# Patient Record
Sex: Female | Born: 1980
Health system: Southern US, Community
[De-identification: ages and names within clinical notes are randomized; demographics above are authoritative.]

## PROBLEM LIST (undated history)

## (undated) DIAGNOSIS — Z8639 Personal history of other endocrine, nutritional and metabolic disease: Secondary | ICD-10-CM

## (undated) DIAGNOSIS — E039 Hypothyroidism, unspecified: Secondary | ICD-10-CM

## (undated) DIAGNOSIS — K5900 Constipation, unspecified: Secondary | ICD-10-CM

## (undated) DIAGNOSIS — Z803 Family history of malignant neoplasm of breast: Secondary | ICD-10-CM

## (undated) DIAGNOSIS — K635 Polyp of colon: Secondary | ICD-10-CM

## (undated) DIAGNOSIS — E785 Hyperlipidemia, unspecified: Secondary | ICD-10-CM

## (undated) DIAGNOSIS — O1490 Unspecified pre-eclampsia, unspecified trimester: Secondary | ICD-10-CM

## (undated) DIAGNOSIS — G43909 Migraine, unspecified, not intractable, without status migrainosus: Secondary | ICD-10-CM

## (undated) DIAGNOSIS — E079 Disorder of thyroid, unspecified: Secondary | ICD-10-CM

## (undated) DIAGNOSIS — K648 Other hemorrhoids: Secondary | ICD-10-CM

## (undated) DIAGNOSIS — T7840XA Allergy, unspecified, initial encounter: Secondary | ICD-10-CM

## (undated) DIAGNOSIS — R7989 Other specified abnormal findings of blood chemistry: Secondary | ICD-10-CM

## (undated) DIAGNOSIS — Z1371 Encounter for nonprocreative screening for genetic disease carrier status: Secondary | ICD-10-CM

## (undated) DIAGNOSIS — E559 Vitamin D deficiency, unspecified: Secondary | ICD-10-CM

## (undated) HISTORY — DX: Allergy, unspecified, initial encounter: T78.40XA

## (undated) HISTORY — DX: Unspecified pre-eclampsia, unspecified trimester: O14.90

## (undated) HISTORY — DX: Personal history of other endocrine, nutritional and metabolic disease: Z86.39

## (undated) HISTORY — DX: Migraine, unspecified, not intractable, without status migrainosus: G43.909

## (undated) HISTORY — DX: Family history of malignant neoplasm of breast: Z80.3

## (undated) HISTORY — PX: POLYPECTOMY: SHX149

## (undated) HISTORY — DX: Encounter for nonprocreative screening for genetic disease carrier status: Z13.71

## (undated) HISTORY — PX: HEMORRHOID BANDING: SHX5850

## (undated) HISTORY — DX: Other specified abnormal findings of blood chemistry: R79.89

## (undated) HISTORY — DX: Constipation, unspecified: K59.00

## (undated) HISTORY — DX: Vitamin D deficiency, unspecified: E55.9

## (undated) HISTORY — DX: Hyperlipidemia, unspecified: E78.5

## (undated) HISTORY — DX: Other hemorrhoids: K64.8

## (undated) HISTORY — DX: Hypothyroidism, unspecified: E03.9

## (undated) HISTORY — PX: THYROID SURGERY: SHX805

## (undated) HISTORY — DX: Polyp of colon: K63.5

---

## 2008-12-30 HISTORY — PX: THYROIDECTOMY: SHX17

## 2011-08-14 ENCOUNTER — Emergency Department: Payer: Self-pay | Admitting: Emergency Medicine

## 2011-12-31 DIAGNOSIS — G932 Benign intracranial hypertension: Secondary | ICD-10-CM

## 2011-12-31 HISTORY — DX: Benign intracranial hypertension: G93.2

## 2012-01-27 ENCOUNTER — Inpatient Hospital Stay: Payer: Self-pay | Admitting: Obstetrics and Gynecology

## 2012-01-27 LAB — CBC WITH DIFFERENTIAL/PLATELET
Eosinophil #: 0.1 10*3/uL (ref 0.0–0.7)
Lymphocyte #: 2.2 10*3/uL (ref 1.0–3.6)
MCH: 30.9 pg (ref 26.0–34.0)
MCHC: 34.3 g/dL (ref 32.0–36.0)
MCV: 90 fL (ref 80–100)
Monocyte #: 0.4 10*3/uL (ref 0.0–0.7)
Neutrophil #: 5.6 10*3/uL (ref 1.4–6.5)
Neutrophil %: 68.1 %
Platelet: 171 10*3/uL (ref 150–440)
RDW: 15.6 % — ABNORMAL HIGH (ref 11.5–14.5)

## 2012-01-28 LAB — HEMATOCRIT: HCT: 37.7 % (ref 35.0–47.0)

## 2012-01-31 LAB — PATHOLOGY REPORT

## 2012-02-04 ENCOUNTER — Encounter (HOSPITAL_COMMUNITY): Payer: Self-pay | Admitting: *Deleted

## 2012-02-04 ENCOUNTER — Emergency Department (HOSPITAL_COMMUNITY): Payer: PRIVATE HEALTH INSURANCE

## 2012-02-04 ENCOUNTER — Emergency Department (HOSPITAL_COMMUNITY)
Admission: EM | Admit: 2012-02-04 | Discharge: 2012-02-04 | Disposition: A | Discharge status: Home / Self Care | Payer: PRIVATE HEALTH INSURANCE | Attending: Emergency Medicine | Admitting: Emergency Medicine

## 2012-02-04 DIAGNOSIS — R51 Headache: Secondary | ICD-10-CM | POA: Insufficient documentation

## 2012-02-04 DIAGNOSIS — I10 Essential (primary) hypertension: Secondary | ICD-10-CM | POA: Insufficient documentation

## 2012-02-04 HISTORY — DX: Disorder of thyroid, unspecified: E07.9

## 2012-02-04 LAB — BASIC METABOLIC PANEL
Calcium: 9.1 mg/dL (ref 8.4–10.5)
Creatinine, Ser: 0.69 mg/dL (ref 0.50–1.10)
GFR calc Af Amer: >90 mL/min (ref >90)
GFR calc non Af Amer: >90 mL/min (ref >90)
Sodium: 137 mEq/L (ref 135–145)

## 2012-02-04 LAB — DIFFERENTIAL
Basophils Absolute: 0 10*3/uL (ref 0.0–0.1)
Basophils Relative: 0 % (ref 0–1)
Eosinophils Absolute: 0.2 10*3/uL (ref 0.0–0.7)
Eosinophils Relative: 2 % (ref 0–5)
Lymphocytes Relative: 22 % (ref 12–46)
Monocytes Absolute: 0.5 10*3/uL (ref 0.1–1.0)

## 2012-02-04 LAB — CBC
MCHC: 32.9 g/dL (ref 30.0–36.0)
MCV: 89.4 fL (ref 78.0–100.0)
Platelets: 306 10*3/uL (ref 150–400)
RDW: 14.3 % (ref 11.5–15.5)
WBC: 10.1 10*3/uL (ref 4.0–10.5)

## 2012-02-04 MED ORDER — HYDROMORPHONE HCL PF 1 MG/ML IJ SOLN: 1.0000 mg | Freq: Once | INTRAMUSCULAR | Status: DC

## 2012-02-04 MED ORDER — ONDANSETRON HCL 4 MG/2ML IJ SOLN: 4.0000 mg | Freq: Once | INTRAMUSCULAR | Status: DC

## 2012-02-04 NOTE — ED Provider Notes (Signed)
History    This chart was scribed for Jacqueline Lennert, MD, MD by Smitty Pluck. The patient was seen in room APA15 and the patient's care was started at 5:24PM.   CSN: [id number]  Arrival date & time 02/04/12  1657   First MD Initiated Contact with Patient 02/04/12 1720      Chief Complaint  Patient presents with  . Headache    (Consider location/radiation/quality/duration/timing/severity/associated sxs/prior treatment) Patient is a 31 y.o. female presenting with headaches. The history is provided by the patient and the spouse.  Headache  This is a new problem. The current episode started more than 2 days ago. The problem occurs constantly. The problem has been gradually worsening. The headache is associated with defecating. The pain is located in the occipital region. The pain is moderate. The pain does not radiate. Pertinent negatives include no fever, no nausea and no vomiting. She has tried acetaminophen for the symptoms. The treatment provided mild relief.   Jacqueline Wilson is a 31 y.o. female who presents to the Emergency Department complaining of headache onset 3 days ago. Pt reports having caesarian section 1 week ago and having elevated BP she has also constipation and being using laxatives. She has taken 1 ibuprofen and percocet (for abdomen) without relief. She states during bowel movement today her headache became increasingly worse. She reports having a bloody nose 3 days ago. Dr. Chauncey Cruel is ob/gyn.   Past Medical History  Diagnosis Date  . Thyroid disease   . Hypertension     Past Surgical History  Procedure Date  . Thyroid surgery   . Cesarean section     History reviewed. No pertinent family history.  History  Substance Use Topics  . Smoking status: Never Smoker   . Smokeless tobacco: Not on file  . Alcohol Use: No    OB History    Grav Para Term Preterm Abortions TAB SAB Ect Mult Living                  Review of Systems  Constitutional: Negative for  fever.  Gastrointestinal: Negative for nausea and vomiting.  Neurological: Positive for headaches.  All other systems reviewed and are negative.   10 Systems reviewed and are negative for acute change except as noted in the HPI.  Allergies  Hydrocodone; Keflex; Latex; Penicillins; and Rubbing alcohol  Home Medications  No current outpatient prescriptions on file.  BP 161/103  Pulse 88  Temp 98 F (36.7 C)  Resp 22  Ht 5\' 3"  (1.6 m)  Wt 192 lb 3 oz (87.176 kg)  BMI 34.04 kg/m2  SpO2 99%  Physical Exam  Nursing note and vitals reviewed. Constitutional: She is oriented to person, place, and time. She appears well-developed and well-nourished. No distress.  HENT:  Head: Normocephalic and atraumatic.  Eyes: Conjunctivae and EOM are normal. No scleral icterus.  Neck: Neck supple. No thyromegaly present.  Cardiovascular: Normal rate and regular rhythm.  Exam reveals no gallop and no friction rub.   No murmur heard. Pulmonary/Chest: No stridor. She has no wheezes. She has no rales. She exhibits no tenderness.  Abdominal: She exhibits no distension. There is no tenderness. There is no rebound.  Musculoskeletal: Normal range of motion. She exhibits no edema.  Lymphadenopathy:    She has no cervical adenopathy.  Neurological: She is oriented to person, place, and time. Coordination normal.  Skin: No rash noted. No erythema.  Psychiatric: She has a normal mood and affect. Her behavior is  normal.    ED Course  Procedures (including critical care time)  DIAGNOSTIC STUDIES: Oxygen Saturation is 99% on room air, normal by my interpretation.    COORDINATION OF CARE:  EDP ordered medication: Diluadid 1 mg and zofran 4 mg  Labs Reviewed  CBC - Abnormal; Notable for the following:    Hemoglobin 11.9 (*)    All other components within normal limits  BASIC METABOLIC PANEL - Abnormal; Notable for the following:    Glucose, Bld 129 (*)    All other components within normal limits    DIFFERENTIAL   Ct Head Wo Contrast  02/04/2012  *RADIOLOGY REPORT*  Clinical Data: Headache  CT HEAD WITHOUT CONTRAST  Technique:  Contiguous axial images were obtained from the base of the skull through the vertex without contrast.  Comparison: None.  Findings: There is no evidence of acute intracranial hemorrhage, brain edema, mass lesion, acute infarction,   mass effect, or midline shift. Acute infarct may be inapparent on noncontrast CT. No other intra-axial abnormalities are seen, and the ventricles and sulci are within normal limits in size and symmetry.   No abnormal extra-axial fluid collections or masses are identified.  No significant calvarial abnormality.  IMPRESSION: 1. Negative for bleed or other acute intracranial process.  Original Report Authenticated By: Osa Craver, M.D.     No diagnosis found.  The pt refused any medicine for her headache because she was breast feeding.  Her headache was mild.  The pt states she has a hx of migraines.  MDM  Stress headache with htn.  Pt is to follow up in two days   The chart was scribed for me under my direct supervision.  I personally performed the history, physical, and medical decision making and all procedures in the evaluation of this patient.Jacqueline Lennert, MD 02/04/12 [phone]

## 2012-02-04 NOTE — ED Notes (Signed)
Pt c/o headache since Sat. Pt is currently breastfeeding the newborn. Pt denies any vomiting.

## 2012-02-04 NOTE — ED Notes (Signed)
Headache since Saturday, started when strained to have BM.  Has been taking laxative.  Had c section 1 week ago Bp was elevated. And had emergent c section.

## 2013-11-01 IMAGING — CT CT HEAD W/O CM
1 series · 16 of 30 positions shown, 20 images · non-contrast
Comparison: None.

CLINICAL DATA: Headache

CT HEAD WITHOUT CONTRAST
TECHNIQUE: Contiguous axial images were obtained from the base of
the skull through the vertex without contrast.

[Series 2: headseq 4.8 h37s · axial · 0.46mm/px · z∈[+130,+293]mm · 16 of 36 slices shown, 20 images]
[im 2/36  brain]
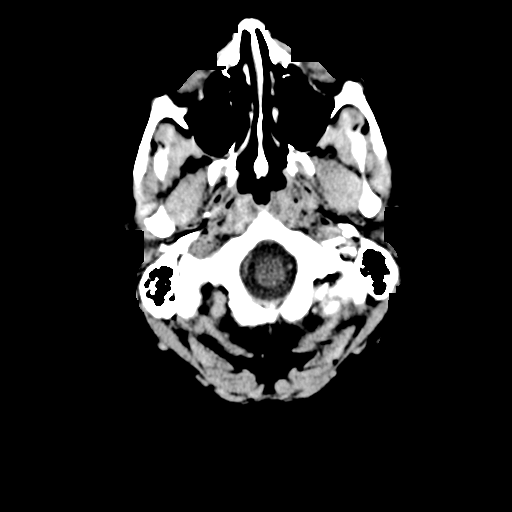
[im 2/36  bone]
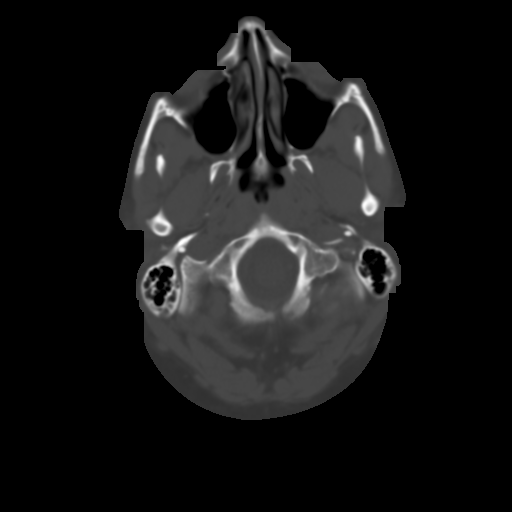
[im 4/36  brain]
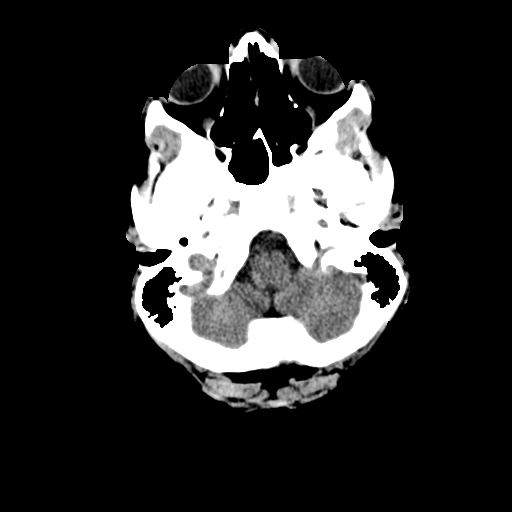
[im 7/36  brain]
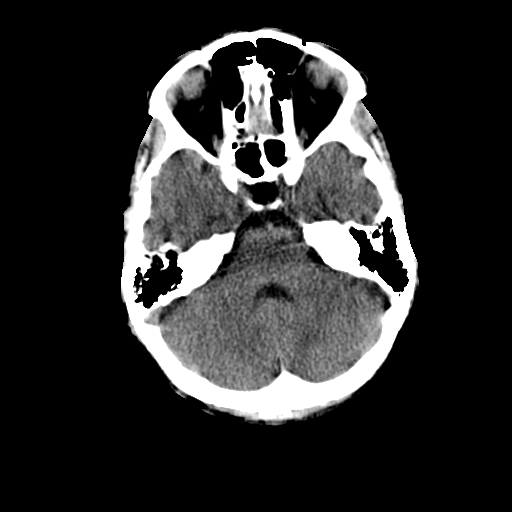
[im 9/36  brain]
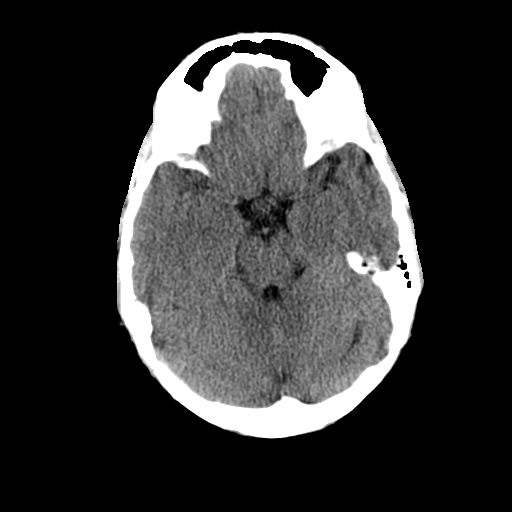
[im 10/36  brain]
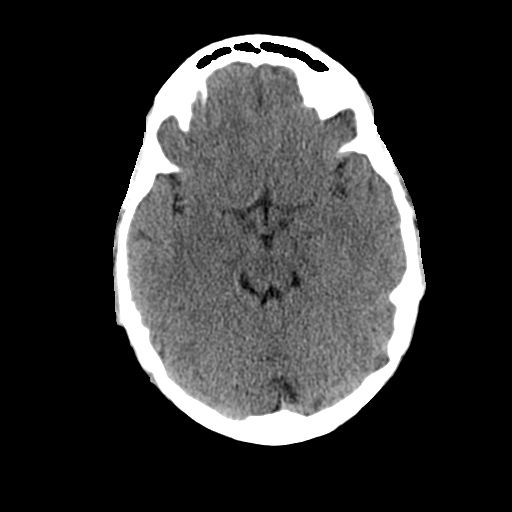
[im 10/36  bone]
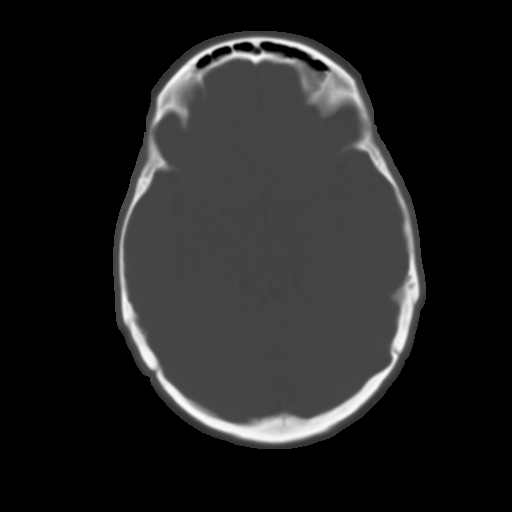
[im 13/36  brain]
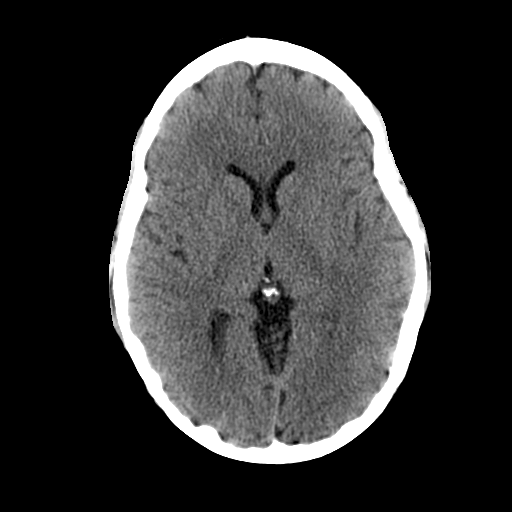
[im 15/36  brain]
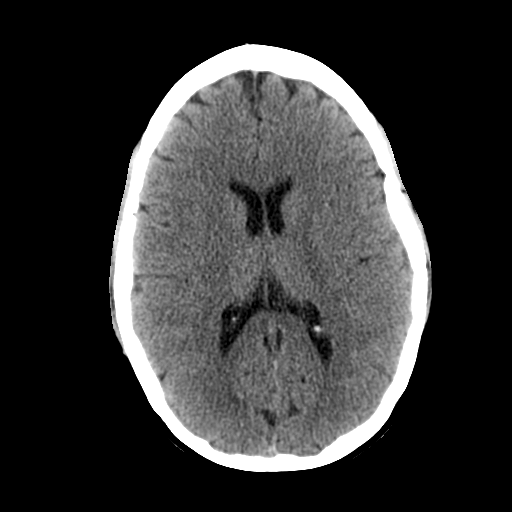
[im 17/36  brain]
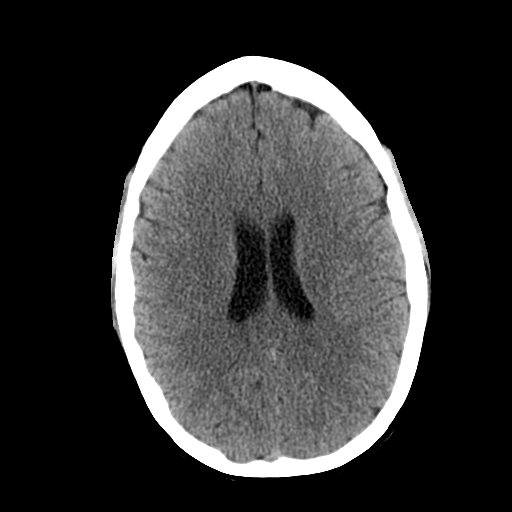
[im 19/36  brain]
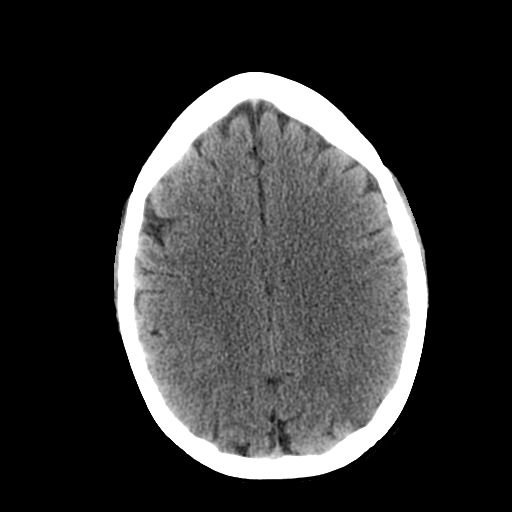
[im 19/36  bone]
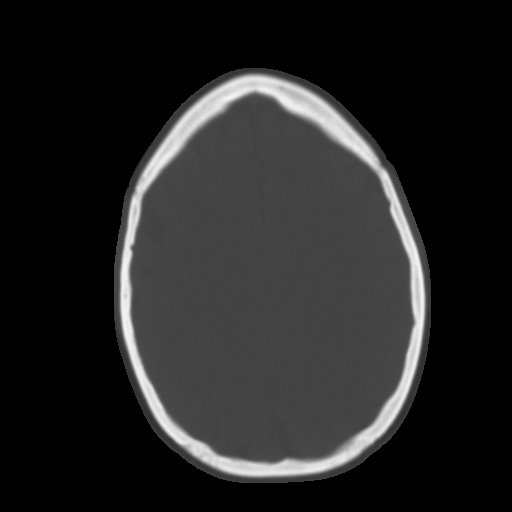
[im 21/36  brain]
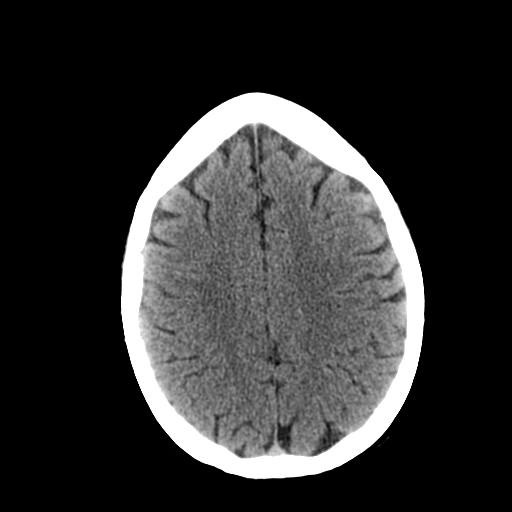
[im 23/36  brain]
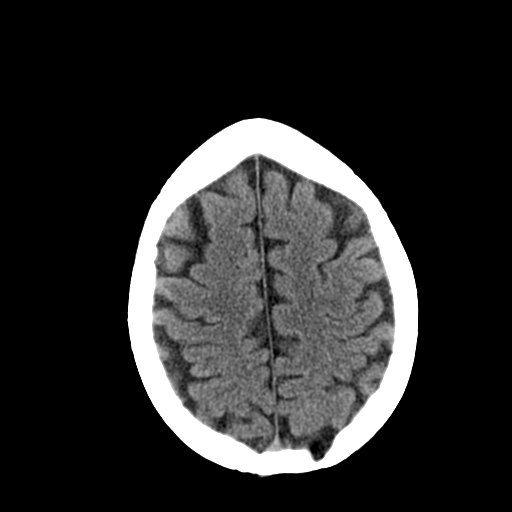
[im 26/36  brain]
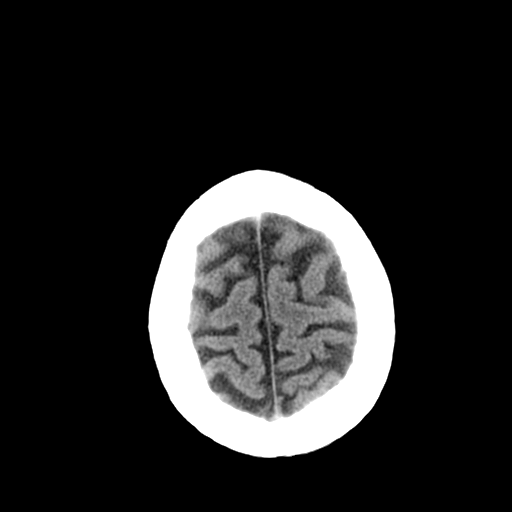
[im 27/36  brain]
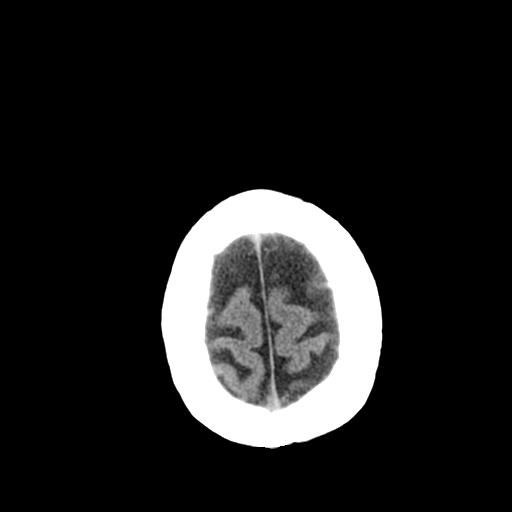
[im 27/36  bone]
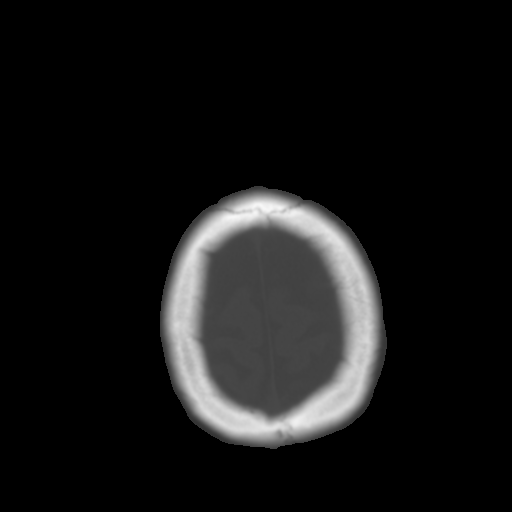
[im 29/36  brain]
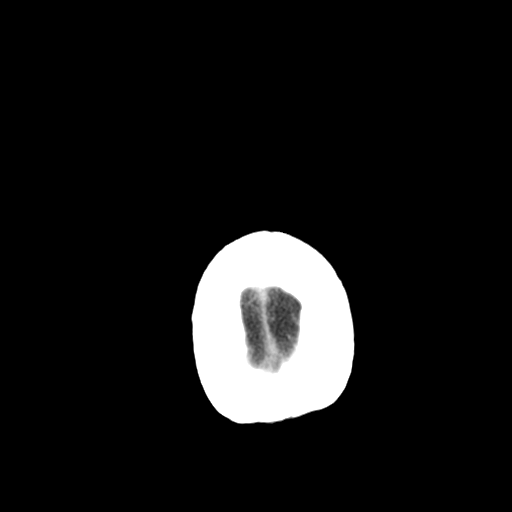
[im 32/36  brain]
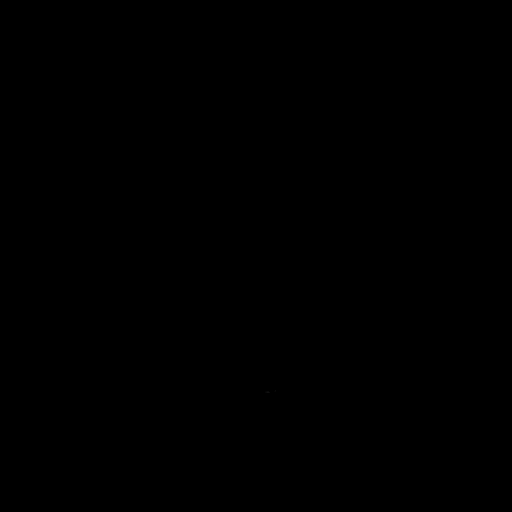
[im 34/36  brain]
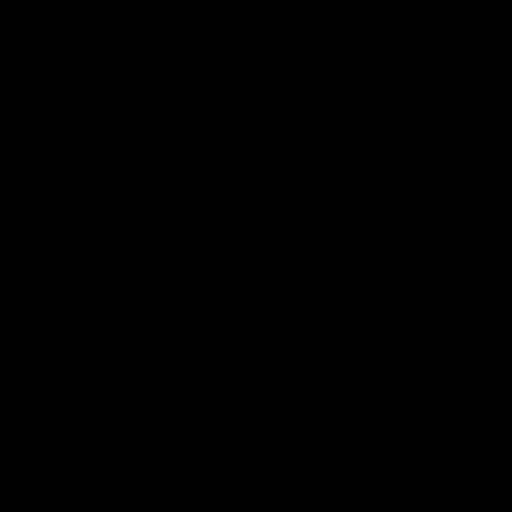

[16 of 30 positions shown; findings below may reference images not displayed]

FINDINGS: There is no evidence of acute intracranial hemorrhage,
brain edema, mass lesion, acute infarction,   mass effect, or
midline shift. Acute infarct may be inapparent on noncontrast CT.
No other intra-axial abnormalities are seen, and the ventricles and
sulci are within normal limits in size and symmetry.   No abnormal
extra-axial fluid collections or masses are identified.  No
significant calvarial abnormality.
IMPRESSION: 1. Negative for bleed or other acute intracranial process.

## 2013-11-09 ENCOUNTER — Ambulatory Visit: Payer: Self-pay

## 2014-09-26 ENCOUNTER — Ambulatory Visit: Payer: Self-pay | Admitting: Internal Medicine

## 2015-04-23 NOTE — Op Note (Signed)
PATIENT NAME:  Jacqueline Wilson, Jacqueline Wilson MR#:  [id number] DATE OF BIRTH:  07-15-1981  DATE OF PROCEDURE:  01/27/2012  PREOPERATIVE DIAGNOSIS: Fetal intolerance to labor with a positive contraction stress test.   POSTOPERATIVE DIAGNOSIS: Fetal intolerance to labor with a positive contraction stress test, vasa previa and small placenta with very small cord.  PROCEDURE PERFORMED: primary c-section   SURGEON: Delsa Sale, M.D.   ASSISTANT: Shann Medal, CNM  ESTIMATED BLOOD LOSS: 1000 mL.  FINDINGS: 38 week fetus weighing 5 pounds, 9 ounces, Apgars 9 and 9, baby girl, multiple fibroids on the uterus.   DESCRIPTION OF PROCEDURE: The patient was taken to the Operating Room and placed in supine position. After adequate anesthesia was instilled, the patient was prepped and draped in the usual sterile fashion. An incision line was drawn on the patient's abdomen and the time out was performed. An incision was then carried sharply down to the fascia. The fascia was nicked in the midline and extended in a superolateral manner with the curved Mayo scissors. The fascia was grasped with Kocher clamps and the superior and inferior rectus fascia was removed from the rectus muscles. The muscle bellies were identified and opened. The peritoneum was grasped and entered sharply. The bladder blade was placed. A bladder flap was created. An uterine incision was made. The infant's head was identified and the infant was delivered. The infant was delivered and the cord was clamped and cut. At this time, the placenta was investigated and it was found that the cord had a vasa previa and that the placental cord was very small as was the placenta. The uterus was exteriorized. Fibroids were identified and the uterus was wrapped in a moist laparotomy sponge. Photographs were taken. The infant was handed off to the awaiting pediatrician, after the cord was clamped and cut. Cord blood was obtained. Pitocin was started. The placenta was  delivered. The interior of the uterus was curetted with a moist laparotomy sponge. The uterine incision was grasped with Pennington clamps and the incision was closed with a running locked chromic suture and a running embrocating suture. A few figure-of-eight sutures were needed on the left lower quadrant of the incision as it was still oozing. The bladder flap was then tacked back up to the bladder incision. The abdomen was cleared of clots. The uterus was placed back into the abdomen. Clots were removed from the belly. The patient's uterus was placed back into the abdomen and the gutters were cleared. Interceed was placed across the uterine incision. Muscle bellies were closed and approximated with a running Vicryl suture. The On-Q pain pump was then placed and the catheters were wrapped around the muscle belly. The fascia was closed with a running Vicryl suture. A subcutaneous plain gut suture was used to approximate Camper's and Scarpa's fascia. Skin clips were placed. A bandage was placed. 4 x 4's were placed under the On-Q pain pump. The incision bandage was placed. The uterus was massaged. A clot was released from the uterus. Clear urine was noted in the Foley bag. The patient was taken to recovery after having tolerated the procedure well.  ____________________________ Delsa Sale, MD cck:slb D: 01/28/2012 21:35:14 ET T: 01/29/2012 08:36:14 ET JOB#: [id number]  cc: Delsa Sale, MD, <Dictator> Delsa Sale MD ELECTRONICALLY SIGNED 01/30/2012 7:41

## 2015-05-09 NOTE — H&P (Signed)
L&D Evaluation:  History:   HPI 34 year old G1P0 at [redacted]w[redacted]d with EDD 02/06/12 presents to L&D from office after 3 min prolong decel during NST.  PNC at [facility] notable for early entry to care, low lying placenta which resolved, choroid plexis noted on 20 week scan but not at 28 weeks, Rh negative, elevated 1 hour GCT, normal 3 hour GTT, and gestational HTN which pt was supposed to be induced at 39 weeks for.   Labs: AB Neg, GBS Neg, RI, VI    Presents with other, prolong decel at office    Patient's Medical History No Chronic Illness    Patient's Surgical History none    Medications Pre Natal Vitamins    Allergies other, latex, morphine, PCN    Social History none    Family History Non-Contributory   ROS:   ROS All systems were reviewed.  HEENT, CNS, GI, GU, Respiratory, CV, Renal and Musculoskeletal systems were found to be normal.   Exam:   Vital Signs stable    Urine Protein not completed    General no apparent distress    Mental Status clear    Abdomen gravid, non-tender    Estimated Fetal Weight Average for gestational age    Back no CVAT    Pelvic no external lesions, closed on previous check    Mebranes Intact    FHT other, normal rate, accels noted, moderate variability, 1-2 min decels noted occasionally    Ucx absent    Skin dry   Impression:   Impression other, IUP 38 4/7, GHTN, non reactive NST   Plan:   Plan EFM/NST, monitor BP, other, CST, possibly cervidil tonight   Electronic Signatures: Shann Medal (CNM)  (Signed 28-Jan-13 23:29)  Authored: L&D Evaluation   Last Updated: 28-Jan-13 23:29 by Shann Medal (CNM)

## 2015-12-11 ENCOUNTER — Encounter: Payer: Self-pay | Admitting: Physician Assistant

## 2015-12-11 ENCOUNTER — Ambulatory Visit (INDEPENDENT_AMBULATORY_CARE_PROVIDER_SITE_OTHER): Payer: PRIVATE HEALTH INSURANCE | Admitting: Physician Assistant

## 2015-12-11 VITALS — BP 112/78 | HR 84 | Temp 98.4°F | Ht 63.75 in | Wt 176.6 lb

## 2015-12-11 DIAGNOSIS — K649 Unspecified hemorrhoids: Secondary | ICD-10-CM

## 2015-12-11 DIAGNOSIS — R42 Dizziness and giddiness: Secondary | ICD-10-CM | POA: Diagnosis not present

## 2015-12-11 DIAGNOSIS — J019 Acute sinusitis, unspecified: Secondary | ICD-10-CM

## 2015-12-11 DIAGNOSIS — B9689 Other specified bacterial agents as the cause of diseases classified elsewhere: Secondary | ICD-10-CM

## 2015-12-11 MED ORDER — DOXYCYCLINE HYCLATE 100 MG PO CAPS: 100.0000 mg | ORAL_CAPSULE | Freq: Two times a day (BID) | ORAL | Status: DC

## 2015-12-11 MED ORDER — MECLIZINE HCL 25 MG PO TABS: 25.0000 mg | ORAL_TABLET | Freq: Three times a day (TID) | ORAL | Status: DC | PRN

## 2015-12-11 MED ORDER — FLUTICASONE PROPIONATE 50 MCG/ACT NA SUSP: 2.0000 | Freq: Every day | NASAL | Status: DC

## 2015-12-11 MED ORDER — HYDROCORTISONE ACETATE 25 MG RE SUPP: 25.0000 mg | Freq: Two times a day (BID) | RECTAL | Status: DC

## 2015-12-11 NOTE — Progress Notes (Signed)
Pre visit review using our clinic review tool, if applicable. No additional management support is needed unless otherwise documented below in the visit note. 

## 2015-12-11 NOTE — Patient Instructions (Signed)
Please take antibiotic as directed.  Increase fluid intake.  Use Saline nasal spray.  Take a daily multivitamin. Take Flonase and Meclizine as directed.  Place a humidifier in the bedroom.  Please call or return clinic if symptoms are not improving.  For hemorrhoids -- continue diet and stool softener. Use the suppositories as directed. You will be contacted by Gastroenterology for further assessment.  Sinusitis Sinusitis is redness, soreness, and swelling (inflammation) of the paranasal sinuses. Paranasal sinuses are air pockets within the bones of your face (beneath the eyes, the middle of the forehead, or above the eyes). In healthy paranasal sinuses, mucus is able to drain out, and air is able to circulate through them by way of your nose. However, when your paranasal sinuses are inflamed, mucus and air can become trapped. This can allow bacteria and other germs to grow and cause infection. Sinusitis can develop quickly and last only a short time (acute) or continue over a long period (chronic). Sinusitis that lasts for more than 12 weeks is considered chronic.  CAUSES  Causes of sinusitis include:  Allergies.  Structural abnormalities, such as displacement of the cartilage that separates your nostrils (deviated septum), which can decrease the air flow through your nose and sinuses and affect sinus drainage.  Functional abnormalities, such as when the small hairs (cilia) that line your sinuses and help remove mucus do not work properly or are not present. SYMPTOMS  Symptoms of acute and chronic sinusitis are the same. The primary symptoms are pain and pressure around the affected sinuses. Other symptoms include:  Upper toothache.  Earache.  Headache.  Bad breath.  Decreased sense of smell and taste.  A cough, which worsens when you are lying flat.  Fatigue.  Fever.  Thick drainage from your nose, which often is green and may contain pus (purulent).  Swelling and warmth over  the affected sinuses. DIAGNOSIS  Your caregiver will perform a physical exam. During the exam, your caregiver may:  Look in your nose for signs of abnormal growths in your nostrils (nasal polyps).  Tap over the affected sinus to check for signs of infection.  View the inside of your sinuses (endoscopy) with a special imaging device with a light attached (endoscope), which is inserted into your sinuses. If your caregiver suspects that you have chronic sinusitis, one or more of the following tests may be recommended:  Allergy tests.  Nasal culture A sample of mucus is taken from your nose and sent to a lab and screened for bacteria.  Nasal cytology A sample of mucus is taken from your nose and examined by your caregiver to determine if your sinusitis is related to an allergy. TREATMENT  Most cases of acute sinusitis are related to a viral infection and will resolve on their own within 10 days. Sometimes medicines are prescribed to help relieve symptoms (pain medicine, decongestants, nasal steroid sprays, or saline sprays).  However, for sinusitis related to a bacterial infection, your caregiver will prescribe antibiotic medicines. These are medicines that will help kill the bacteria causing the infection.  Rarely, sinusitis is caused by a fungal infection. In theses cases, your caregiver will prescribe antifungal medicine. For some cases of chronic sinusitis, surgery is needed. Generally, these are cases in which sinusitis recurs more than 3 times per year, despite other treatments. HOME CARE INSTRUCTIONS   Drink plenty of water. Water helps thin the mucus so your sinuses can drain more easily.  Use a humidifier.  Inhale steam 3 to  4 times a day (for example, sit in the bathroom with the shower running).  Apply a warm, moist washcloth to your face 3 to 4 times a day, or as directed by your caregiver.  Use saline nasal sprays to help moisten and clean your sinuses.  Take  over-the-counter or prescription medicines for pain, discomfort, or fever only as directed by your caregiver. SEEK IMMEDIATE MEDICAL CARE IF:  You have increasing pain or severe headaches.  You have nausea, vomiting, or drowsiness.  You have swelling around your face.  You have vision problems.  You have a stiff neck.  You have difficulty breathing. MAKE SURE YOU:   Understand these instructions.  Will watch your condition.  Will get help right away if you are not doing well or get worse. Document Released: 12/16/2005 Document Revised: 03/09/2012 Document Reviewed: 12/31/2011 [facility] Patient Information 2014 [place], [place].

## 2015-12-12 DIAGNOSIS — J019 Acute sinusitis, unspecified: Principal | ICD-10-CM

## 2015-12-12 DIAGNOSIS — B9689 Other specified bacterial agents as the cause of diseases classified elsewhere: Secondary | ICD-10-CM | POA: Insufficient documentation

## 2015-12-12 DIAGNOSIS — K649 Unspecified hemorrhoids: Secondary | ICD-10-CM | POA: Insufficient documentation

## 2015-12-12 DIAGNOSIS — R42 Dizziness and giddiness: Secondary | ICD-10-CM | POA: Insufficient documentation

## 2015-12-12 NOTE — Progress Notes (Signed)
Patient presents to clinic today to establish care.  Acute Concerns: Patient present to the clinic complaining of sinus pressure, sinus pain, aches and intermittent dizziness over the past 1.5 weeks. Denies fever, chills, chest congestion. Dry cough only.   Patient also c/o hemorrhoids over the past year, worsening in the past couple of months with painful defecation. Rare blood noted on toilet paper. Has been increasing fiber and taking stool softeners without relief in symptoms.  Health Maintenance: PAP -- 2014 and normal per patient. If followed by GYN  Past Medical History  Diagnosis Date  . Thyroid disease     Thryoidectomy  . Pre-eclampsia     Past Surgical History  Procedure Laterality Date  . Thyroid surgery    . Cesarean section      Current Outpatient Prescriptions on File Prior to Visit  Medication Sig Dispense Refill  . ibuprofen (ADVIL,MOTRIN) 800 MG tablet Take 800 mg by mouth as needed.     No current facility-administered medications on file prior to visit.    Allergies  Allergen Reactions  . Latex Anaphylaxis  . Rubbing Alcohol [Alcohol] Anaphylaxis  . Hydrocodone     Suicidal   . Penicillins   . Cephalexin Rash    History reviewed. No pertinent family history.  Social History   Social History  . Marital Status: Married    Spouse Name: N/A  . Number of Children: 1  . Years of Education: N/A   Occupational History  . Marketing    . Therapist    Social History Main Topics  . Smoking status: Never Smoker   . Smokeless tobacco: Never Used  . Alcohol Use: No  . Drug Use: No  . Sexual Activity: Not Currently   Other Topics Concern  . Not on file   Social History Narrative   Review of Systems  Constitutional: Positive for malaise/fatigue. Negative for fever.  HENT: Positive for congestion and ear pain.   Eyes: Negative for blurred vision and double vision.  Respiratory: Positive for cough. Negative for sputum production and  shortness of breath.   Cardiovascular: Negative for chest pain and palpitations.  Gastrointestinal: Negative for heartburn, nausea, vomiting, abdominal pain, diarrhea, constipation and melena.  Neurological: Positive for dizziness and headaches.   BP 112/78 mmHg  Pulse 84  Temp(Src) 98.4 F (36.9 C) (Oral)  Ht 5' 3.75" (1.619 m)  Wt 176 lb 9.6 oz (80.105 kg)  BMI 30.56 kg/m2  SpO2 99%  Physical Exam  Constitutional: She is oriented to person, place, and time and well-developed, well-nourished, and in no distress.  HENT:  Head: Normocephalic and atraumatic.  Right Ear: A middle ear effusion is present.  Left Ear: A middle ear effusion is present.  Nose: Right sinus exhibits frontal sinus tenderness. Left sinus exhibits frontal sinus tenderness.  Mouth/Throat: Uvula is midline, oropharynx is clear and moist and mucous membranes are normal.  Eyes: Conjunctivae are normal. Pupils are equal, round, and reactive to light.  Neck: Neck supple.  Cardiovascular: Normal rate, regular rhythm, normal heart sounds and intact distal pulses.   Pulmonary/Chest: Effort normal and breath sounds normal. No respiratory distress. She has no wheezes. She has no rales. She exhibits no tenderness.  Genitourinary: Rectal exam shows external hemorrhoid, internal hemorrhoid and tenderness. Rectal exam shows no fissure.  Chaperone present for examination  Lymphadenopathy:    She has no cervical adenopathy.  Neurological: She is alert and oriented to person, place, and time.  Skin: Skin is warm  and dry. No rash noted.  Vitals reviewed.  No results found for this or any previous visit (from the past 2160 hour(s)).  Assessment/Plan: Acute bacterial sinusitis Rx doxycycline.  Increase fluids.  Rest.  Saline nasal spray.  Probiotic.  Mucinex as directed.  Humidifier in bedroom. Flonase daily.  Call or return to clinic if symptoms are not improving.   Hemorrhoid Rx rectal anusol-hc to use BID. Continue  supportive measures. Referral to colorectal clinic placed for further management.  Vertigo Secondary to fluid behind ears. Sinusitis is being treated. Rx Flonase daily. Meclizine for dizziness. Follow-up if not resolving.

## 2015-12-12 NOTE — Assessment & Plan Note (Signed)
Rx doxycycline.  Increase fluids.  Rest.  Saline nasal spray.  Probiotic.  Mucinex as directed.  Humidifier in bedroom. Flonase daily.  Call or return to clinic if symptoms are not improving.

## 2015-12-12 NOTE — Assessment & Plan Note (Signed)
Rx rectal anusol-hc to use BID. Continue supportive measures. Referral to colorectal clinic placed for further management.

## 2015-12-12 NOTE — Assessment & Plan Note (Signed)
Secondary to fluid behind ears. Sinusitis is being treated. Rx Flonase daily. Meclizine for dizziness. Follow-up if not resolving.

## 2015-12-13 ENCOUNTER — Encounter: Payer: Self-pay | Admitting: Gastroenterology

## 2016-02-06 ENCOUNTER — Telehealth: Payer: Self-pay | Admitting: Physician Assistant

## 2016-02-06 NOTE — Telephone Encounter (Signed)
LM for pt to call and schedule flu shot or update records. °

## 2016-02-15 ENCOUNTER — Ambulatory Visit: Payer: PRIVATE HEALTH INSURANCE | Admitting: Gastroenterology

## 2016-02-28 DIAGNOSIS — K635 Polyp of colon: Secondary | ICD-10-CM

## 2016-02-28 HISTORY — DX: Polyp of colon: K63.5

## 2016-03-07 ENCOUNTER — Ambulatory Visit (HOSPITAL_BASED_OUTPATIENT_CLINIC_OR_DEPARTMENT_OTHER)
Admission: RE | Admit: 2016-03-07 | Discharge: 2016-03-07 | Disposition: A | Discharge status: Home / Self Care | Payer: No Typology Code available for payment source | Source: Ambulatory Visit | Attending: Emergency Medicine | Admitting: Emergency Medicine

## 2016-03-07 ENCOUNTER — Other Ambulatory Visit (HOSPITAL_BASED_OUTPATIENT_CLINIC_OR_DEPARTMENT_OTHER): Payer: Self-pay | Admitting: Emergency Medicine

## 2016-03-07 DIAGNOSIS — R51 Headache: Secondary | ICD-10-CM | POA: Diagnosis not present

## 2016-03-07 DIAGNOSIS — R519 Headache, unspecified: Secondary | ICD-10-CM

## 2016-03-21 ENCOUNTER — Ambulatory Visit (INDEPENDENT_AMBULATORY_CARE_PROVIDER_SITE_OTHER): Payer: No Typology Code available for payment source | Admitting: Gastroenterology

## 2016-03-21 ENCOUNTER — Encounter: Payer: Self-pay | Admitting: Gastroenterology

## 2016-03-21 VITALS — BP 110/74 | HR 76 | Ht 63.0 in | Wt 179.1 lb

## 2016-03-21 DIAGNOSIS — K5900 Constipation, unspecified: Secondary | ICD-10-CM | POA: Diagnosis not present

## 2016-03-21 DIAGNOSIS — K649 Unspecified hemorrhoids: Secondary | ICD-10-CM | POA: Diagnosis not present

## 2016-03-21 DIAGNOSIS — K625 Hemorrhage of anus and rectum: Secondary | ICD-10-CM

## 2016-03-21 MED ORDER — NA SULFATE-K SULFATE-MG SULF 17.5-3.13-1.6 GM/177ML PO SOLN: 1.0000 | Freq: Once | ORAL | Status: DC

## 2016-03-21 NOTE — Patient Instructions (Signed)

## 2016-03-21 NOTE — Progress Notes (Signed)
HPI :  35 y/o female with history of migraines seen in consultation for rectal bleeding and concern for hemorrhoids  Patient reports she developed hemorrhoids with her pregnancy in May 2012. She reports about a year ago she had significant worsening of her hemorrhoids with perianal pain and irritation / bleeding. She reported this has caused discomfort with vaginal intercourse.  She was on oral contraceptive, generic Junel, which she thinks caused this to be worse perhaps due to constipation at the time.   At this time she uses a dulcolax suppository every 2-3 days due to sense of incomplete evacuation and to help stimulate a bowel movement. She has difficulty passing stool, she has has the urge to have a bowel movement but states she does not have the "muscle ability" to bear down and push out stool. She has been having bleeding with each bowel movement up to 2-3 times per day. The blood is in the toilet and in the stools, and on the toilet paper. She would also have blood leak on to her underwear. The stools are soft, she is eating a high fiber diet. She states laxatives and fiber supplements which have not helped too much, dulcolax suppositories have helped the most and works well for her. The bleeding has been less in recent months. She has been using dulcolax for 6 months. She reports the hemorrhoids prolapse, sometimes go back in on their own, sometimes she has to push them back in.  She hemorrhoids are painful and irritating. She has not found benefit with Anusol or Sitz baths.   She has increased gas and bloating which bother her at times. She thinks it is worse when she is constipated. A bowel movement relieves her bloating No focal abdominal pains. No nausea or vomiting. No unexpected weight loss. No FH of Crohns or colitis. No FH of colon cancer. No prior endoscopic evaluation.   Past Medical History  Diagnosis Date  . Thyroid disease     Thryoidectomy  . Pre-eclampsia   . Migraine       Past Surgical History  Procedure Laterality Date  . Thyroid surgery    . Cesarean section     Family History  Problem Relation Age of Onset  . Heart disease Father   . COPD Father     smoker  . Obesity Mother    Social History  Substance Use Topics  . Smoking status: Never Smoker   . Smokeless tobacco: Never Used  . Alcohol Use: No   Current Outpatient Prescriptions  Medication Sig Dispense Refill  . hydrocortisone (ANUSOL-HC) 25 MG suppository Place 1 suppository (25 mg total) rectally 2 (two) times daily. 12 suppository 0  . ibuprofen (ADVIL,MOTRIN) 800 MG tablet Take 800 mg by mouth as needed.    . LO LOESTRIN FE 1 MG-10 MCG / 10 MCG tablet Take 1 tablet by mouth daily.  12  . OVER THE COUNTER MEDICATION Allegra daily.    Marland Kitchen SYNTHROID 175 MCG tablet Take 175 mcg by mouth daily.  2  . Na Sulfate-K Sulfate-Mg Sulf SOLN Take 1 kit by mouth once. 354 mL 0   No current facility-administered medications for this visit.   Allergies  Allergen Reactions  . Latex Anaphylaxis  . Rubbing Alcohol [Alcohol] Anaphylaxis  . Hydrocodone     Suicidal   . Penicillins   . Cephalexin Rash     Review of Systems: All systems reviewed and negative except where noted in HPI.   Lab Results  Component Value Date   WBC 10.1 02/04/2012   HGB 11.9* 02/04/2012   HCT 36.2 02/04/2012   MCV 89.4 02/04/2012   PLT 306 02/04/2012    Lab Results  Component Value Date   CREATININE 0.69 02/04/2012   BUN 15 02/04/2012   NA 137 02/04/2012   K 4.0 02/04/2012   CL 105 02/04/2012   CO2 22 02/04/2012   No results found for: ALT, AST, GGT, ALKPHOS, BILITOT   Physical Exam: BP 110/74 mmHg  Pulse 76  Ht _0  (1.6 m)  Wt 179 lb 2 oz (81.251 kg)  BMI 31.74 kg/m2 Constitutional: Pleasant,well-developed, female in no acute distress. HEENT: Normocephalic and atraumatic. Conjunctivae are normal. No scleral icterus. Neck supple.  Cardiovascular: Normal rate, regular rhythm.   Pulmonary/chest: Effort normal and breath sounds normal. No wheezing, rales or rhonchi. Abdominal: Soft, nondistended, nontender. Bowel sounds active throughout. There are no masses palpable. No hepatomegaly. Rectal - deferred to time of colonoscopy Extremities: no edema Lymphadenopathy: No cervical adenopathy noted. Neurological: Alert and oriented to person place and time. Skin: Skin is warm and dry. No rashes noted. Psychiatric: Normal mood and affect. Behavior is normal.   ASSESSMENT AND PLAN: 35 y/o female with ongoing rectal bleeding and constipation / sense of incomplete evacuation. Based on her history her bleeding may very likely be due to hemorrhoids in the setting of constipation, however given her symptoms I am recommending some sort of endoscopy with either flex sig or colonoscopy to ensure no bleeding mass lesion or polyp. Following a discussion of each of these options, her preference was to proceed with colonoscopy. We deferred rectal exam to timing of colonoscopy which should be done in the near future. Otherwise if her symptoms are due to hemorrhoids we discussed options for treatment and we may proceed with banding given she has tried other medical therapies including fiber. We otherwise discussed options for her constipation - she has tried several OTC remedies including fiber. She is happy with dulcolax PRN for now and wishes to continue this. I would recommend considering other options to treat this but will await colonoscopy for now.  The indications, risks, and benefits of colonoscopy were explained to the patient in detail. Risks include but are not limited to bleeding, perforation, adverse reaction to medications, and cardiopulmonary compromise. Sequelae include but are not limited to the possibility of surgery, hospitalization, and mortality. The patient verbalized understanding and wished to proceed. All questions answered, referred to the scheduler and bowel prep ordered.  Further recommendations pending results of the exam.   Strathmere Cellar, MD [facility] Pager [phone]  CC: Brunetta Jeans, PA-C

## 2016-03-27 ENCOUNTER — Encounter: Payer: Self-pay | Admitting: Gastroenterology

## 2016-03-27 ENCOUNTER — Telehealth: Payer: Self-pay | Admitting: *Deleted

## 2016-03-27 ENCOUNTER — Ambulatory Visit (AMBULATORY_SURGERY_CENTER): Payer: No Typology Code available for payment source | Admitting: Gastroenterology

## 2016-03-27 VITALS — BP 109/83 | HR 72 | Temp 98.4°F | Resp 10 | Ht 63.0 in | Wt 179.0 lb

## 2016-03-27 DIAGNOSIS — D128 Benign neoplasm of rectum: Secondary | ICD-10-CM

## 2016-03-27 DIAGNOSIS — K621 Rectal polyp: Secondary | ICD-10-CM

## 2016-03-27 DIAGNOSIS — D123 Benign neoplasm of transverse colon: Secondary | ICD-10-CM

## 2016-03-27 DIAGNOSIS — K625 Hemorrhage of anus and rectum: Secondary | ICD-10-CM | POA: Diagnosis not present

## 2016-03-27 HISTORY — PX: COLONOSCOPY: SHX174

## 2016-03-27 MED ORDER — SODIUM CHLORIDE 0.9 % IV SOLN: 500.0000 mL | INTRAVENOUS | Status: DC

## 2016-03-27 NOTE — Patient Instructions (Signed)
YOU HAD AN ENDOSCOPIC PROCEDURE TODAY AT [facility]:   Refer to the procedure report that was given to you for any specific questions about what was found during the examination.  If the procedure report does not answer your questions, please call your gastroenterologist to clarify.  If you requested that your care partner not be given the details of your procedure findings, then the procedure report has been included in a sealed envelope for you to review at your convenience later.  YOU SHOULD EXPECT: Some feelings of bloating in the abdomen. Passage of more gas than usual.  Walking can help get rid of the air that was put into your GI tract during the procedure and reduce the bloating. If you had a lower endoscopy (such as a colonoscopy or flexible sigmoidoscopy) you may notice spotting of blood in your stool or on the toilet paper. If you underwent a bowel prep for your procedure, you may not have a normal bowel movement for a few days.  Please Note:  You might notice some irritation and congestion in your nose or some drainage.  This is from the oxygen used during your procedure.  There is no need for concern and it should clear up in a day or so.  SYMPTOMS TO REPORT IMMEDIATELY:   Following lower endoscopy (colonoscopy or flexible sigmoidoscopy):  Excessive amounts of blood in the stool  Significant tenderness or worsening of abdominal pains  Swelling of the abdomen that is new, acute  Fever of 100F or higher   For urgent or emergent issues, a gastroenterologist can be reached at any hour by calling [phone].   DIET: Your first meal following the procedure should be a small meal and then it is ok to progress to your normal diet. Heavy or fried foods are harder to digest and may make you feel nauseous or bloated.  Likewise, meals heavy in dairy and vegetables can increase bloating.  Drink plenty of fluids but you should avoid alcoholic beverages for 24  hours.  ACTIVITY:  You should plan to take it easy for the rest of today and you should NOT DRIVE or use heavy machinery until tomorrow (because of the sedation medicines used during the test).    FOLLOW UP: Our staff will call the number listed on your records the next business day following your procedure to check on you and address any questions or concerns that you may have regarding the information given to you following your procedure. If we do not reach you, we will leave a message.  However, if you are feeling well and you are not experiencing any problems, there is no need to return our call.  We will assume that you have returned to your regular daily activities without incident.  If any biopsies were taken you will be contacted by phone or by letter within the next 1-3 weeks.  Please call us at [phone] if you have not heard about the biopsies in 3 weeks.    SIGNATURES/CONFIDENTIALITY: You and/or your care partner have signed paperwork which will be entered into your electronic medical record.  These signatures attest to the fact that that the information above on your After Visit Summary has been reviewed and is understood.  Full responsibility of the confidentiality of this discharge information lies with you and/or your care-partner.    Handouts were given to your care partner on polyps, hemorrhoids and [place] system. Please hold aspirin, aspirin products and any NSAIDS  for 2 weeks. You may resume your other current medications today. Await biopsy results. Please call if any questions or concerns.

## 2016-03-27 NOTE — Progress Notes (Signed)
Called to room to assist during endoscopic procedure.  Patient ID and intended procedure confirmed with present staff. Received instructions for my participation in the procedure from the performing physician.  

## 2016-03-27 NOTE — Telephone Encounter (Signed)
Called patient to schedule hemorrhoid banding as per procedure note. Patient wants to call back Friday to schedule when she has a work schedule.

## 2016-03-27 NOTE — Progress Notes (Signed)
When Jayme Cloud, RN removed IV,  Pressure was held at site. No dressing or tape was covering the IV site at the pt's request.  No bleeding noted.maw

## 2016-03-27 NOTE — Progress Notes (Signed)
TO PACU pt awake and alert report to RN

## 2016-03-27 NOTE — Progress Notes (Signed)
Pt want to set up hemorrhoid banding.  Per Dr. Havery Moros call Rollene Fare, RN on the 3rd floor and have her call the pt to schedule.  Message left on Regina's answering machine at 11:20. maw

## 2016-03-27 NOTE — Op Note (Signed)
[facility] Patient Name: Jacqueline Wilson Procedure Date: 03/27/2016 10:15 AM MRN: [id number] Endoscopist: Remo Lipps P. Havery Moros , MD Age: 35 Referring MD:  Date of Birth: 12-10-1981 Gender: Female Procedure:                Colonoscopy Indications:              Hematochezia Medicines:                Monitored Anesthesia Care Procedure:                Pre-Anesthesia Assessment:                           - Prior to the procedure, a History and Physical                            was performed, and patient medications and                            allergies were reviewed. The patient's tolerance of                            previous anesthesia was also reviewed. The risks                            and benefits of the procedure and the sedation                            options and risks were discussed with the patient.                            All questions were answered, and informed consent                            was obtained. Prior Anticoagulants: The patient has                            taken no previous anticoagulant or antiplatelet                            agents. ASA Grade Assessment: II - A patient with                            mild systemic disease. After reviewing the risks                            and benefits, the patient was deemed in                            satisfactory condition to undergo the procedure.                           After obtaining informed consent, the colonoscope  was passed under direct vision. Throughout the                            procedure, the patient's blood pressure, pulse, and                            oxygen saturations were monitored continuously. The                            Model CF-HQ190L [id number]) scope was introduced                            through the anus and advanced to the the cecum,                            identified by appendiceal orifice and ileocecal            valve. The colonoscopy was performed without                            difficulty. The patient tolerated the procedure                            well. The quality of the bowel preparation was                            adequate. The terminal ileum, ileocecal valve,                            appendiceal orifice, and rectum were photographed. Scope In: 10:18:54 AM Scope Out: 10:34:02 AM Scope Withdrawal Time: 0 hours 10 minutes 59 seconds  Total Procedure Duration: 0 hours 15 minutes 8 seconds  Findings:      The perianal exam findings include a skin tag.      A diminutive polyp was found in the hepatic flexure. The polyp was       sessile. The polyp was removed with a cold biopsy forceps. Resection and       retrieval were complete.      A 4 mm polyp was found in the rectum. The polyp was sessile. The polyp       was removed with a cold snare. Resection and retrieval were complete.      Non-bleeding internal hemorrhoids were found during retroflexion. The       hemorrhoids were small.      The exam was otherwise without abnormality on direct and retroflexion       views. The ileum was difficult to intubate and only limited views were       obtained but it appeared normal. Complications:            No immediate complications. Estimated blood loss:                            Minimal. Estimated Blood Loss:     Estimated blood loss was minimal. Impression:               - Perianal skin tag found on perianal exam.                           -  One diminutive polyp at the hepatic flexure,                            removed with a cold biopsy forceps. Resected and                            retrieved.                           - One 4 mm polyp in the rectum, removed with a cold                            snare. Resected and retrieved.                           - Non-bleeding internal hemorrhoids.                           - The examination was otherwise normal on direct                             and retroflexion views. Recommendation:           - Patient has a contact number available for                            emergencies. The signs and symptoms of potential                            delayed complications were discussed with the                            patient. Return to normal activities tomorrow.                            Written discharge instructions were provided to the                            patient.                           - Resume previous diet.                           - Continue present medications.                           - No aspirin, ibuprofen, naproxen, or other                            non-steroidal anti-inflammatory drugs for 2 weeks                            after polyp removal.                           - Await pathology  results.                           - Repeat colonoscopy is recommended for                            surveillance. The colonoscopy date will be                            determined after pathology results from today's                            exam become available for review.                           - Use fiber, for example Citrucel, Fibercon, Konsyl                            or Metamucil. Procedure Code(s):        --- Professional ---                           [phone], Colonoscopy, flexible; with removal of                            tumor(s), polyp(s), or other lesion(s) by snare                            technique                           [id number], 61, Colonoscopy, flexible; with biopsy,                            single or multiple CPT copyright 2016 American Medical Association. All rights reserved. Remo Lipps P. Havery Moros, MD 03/27/2016 10:38:24 AM This report has been signed electronically. Number of Addenda: 0 Referring MD:      Brunetta Jeans

## 2016-03-27 NOTE — Progress Notes (Signed)
No problems noted in the recovery room. maw 

## 2016-03-27 NOTE — Progress Notes (Signed)
Pt is still tearful off an on throughout the recovery room time.  Dr. Havery Moros back in to speak with the pt and her husband and reassured pt not to worry and that the sedation sometimes affects the younger pt and makes tham emotional.  Pt d/c to home. maw

## 2016-03-28 ENCOUNTER — Telehealth: Payer: Self-pay

## 2016-03-28 NOTE — Telephone Encounter (Signed)
  Follow up Call-  Call back number 03/27/2016  Post procedure Call Back phone  # [phone]  Permission to leave phone message Yes     Patient questions:  Do you have a fever, pain , or abdominal swelling? No. Pain Score  0 *  Have you tolerated food without any problems? Yes.    Have you been able to return to your normal activities? Yes.    Do you have any questions about your discharge instructions: Diet   No. Medications  No. Follow up visit  No.  Do you have questions or concerns about your Care? No.  Actions: * If pain score is 4 or above: No action needed, pain <4.

## 2016-04-24 ENCOUNTER — Ambulatory Visit (INDEPENDENT_AMBULATORY_CARE_PROVIDER_SITE_OTHER): Payer: No Typology Code available for payment source | Admitting: Physician Assistant

## 2016-04-24 ENCOUNTER — Encounter: Payer: Self-pay | Admitting: Physician Assistant

## 2016-04-24 VITALS — BP 110/72 | HR 85 | Temp 98.2°F | Ht 63.0 in | Wt 177.4 lb

## 2016-04-24 DIAGNOSIS — J45901 Unspecified asthma with (acute) exacerbation: Secondary | ICD-10-CM

## 2016-04-24 DIAGNOSIS — T7840XA Allergy, unspecified, initial encounter: Secondary | ICD-10-CM

## 2016-04-24 DIAGNOSIS — J45909 Unspecified asthma, uncomplicated: Secondary | ICD-10-CM

## 2016-04-24 MED ORDER — ALBUTEROL SULFATE (2.5 MG/3ML) 0.083% IN NEBU: 2.5000 mg | INHALATION_SOLUTION | Freq: Four times a day (QID) | RESPIRATORY_TRACT | Status: DC | PRN

## 2016-04-24 MED ORDER — ALBUTEROL SULFATE (2.5 MG/3ML) 0.083% IN NEBU
2.5000 mg | INHALATION_SOLUTION | Freq: Once | RESPIRATORY_TRACT | Status: AC
  Administered 2016-04-24: 2.5 mg via RESPIRATORY_TRACT

## 2016-04-24 MED ORDER — EPINEPHRINE 0.3 MG/0.3ML IJ SOAJ: 0.3000 mg | Freq: Once | INTRAMUSCULAR | Status: DC

## 2016-04-24 MED ORDER — ALBUTEROL SULFATE HFA 108 (90 BASE) MCG/ACT IN AERS: 2.0000 | INHALATION_SPRAY | Freq: Four times a day (QID) | RESPIRATORY_TRACT | Status: DC | PRN

## 2016-04-24 MED ORDER — MONTELUKAST SODIUM 10 MG PO TABS
10.0000 mg | ORAL_TABLET | Freq: Every day | ORAL | Status: DC
Start: 2016-04-24 — End: 2016-09-05

## 2016-04-24 NOTE — Patient Instructions (Signed)
Take antibiotic (Biaxin) as directed.  Increase fluids.  Get plenty of rest. Use Mucinex for congestion.. Take a daily probiotic (I recommend Align or Culturelle, but even Activia Yogurt may be beneficial).  A humidifier placed in the bedroom may offer some relief for a dry, scratchy throat of nasal irritation.    I do want you to finish the steroid pack to keep lungs open. Use the albuterol as directed. Start a daily singulair.  Read information below on acute bronchitis. Please call or return to clinic if symptoms are not improving.  Follow-up on Monday. If anything worsens, go to the ER.  Acute Bronchitis Bronchitis is when the airways that extend from the windpipe into the lungs get red, puffy, and painful (inflamed). Bronchitis often causes thick spit (mucus) to develop. This leads to a cough. A cough is the most common symptom of bronchitis. In acute bronchitis, the condition usually begins suddenly and goes away over time (usually in 2 weeks). Smoking, allergies, and asthma can make bronchitis worse. Repeated episodes of bronchitis may cause more lung problems.  HOME CARE  Rest.  Drink enough fluids to keep your pee (urine) clear or pale yellow (unless you need to limit fluids as told by your doctor).  Only take over-the-counter or prescription medicines as told by your doctor.  Avoid smoking and secondhand smoke. These can make bronchitis worse. If you are a smoker, think about using nicotine gum or skin patches. Quitting smoking will help your lungs heal faster.  Reduce the chance of getting bronchitis again by:  Washing your hands often.  Avoiding people with cold symptoms.  Trying not to touch your hands to your mouth, nose, or eyes.  Follow up with your doctor as told.  GET HELP IF: Your symptoms do not improve after 1 week of treatment. Symptoms include:  Cough.  Fever.  Coughing up thick spit.  Body aches.  Chest congestion.  Chills.  Shortness of  breath.  Sore throat.  GET HELP RIGHT AWAY IF:   You have an increased fever.  You have chills.  You have severe shortness of breath.  You have bloody thick spit (sputum).  You throw up (vomit) often.  You lose too much body fluid (dehydration).  You have a severe headache.  You faint.  MAKE SURE YOU:   Understand these instructions.  Will watch your condition.  Will get help right away if you are not doing well or get worse. Document Released: 06/03/2008 Document Revised: 08/18/2013 Document Reviewed: 06/08/2013 [facility] Patient Information 2015 [place], [place]. This information is not intended to replace advice given to you by your health care provider. Make sure you discuss any questions you have with your health care provider.

## 2016-04-24 NOTE — Telephone Encounter (Signed)
error:[id number] ° °

## 2016-04-24 NOTE — Progress Notes (Signed)
Patient presents to clinic today to discuss multiple issues.  Patient endorses history of severe allergic reaction to any exposure to alcohol products. Has an Epi-pen for this reason but it is outdated. She avoids triggers but would like her Epi Pen renewed.  Patient also endorses wheezing, chest tightness with cough and fatigue starting this weekend. Went to an UC and was diagnosed with asthmatic bronchitis. Was started on Prednisone pack and Biaxin. Endorses taking antibiotic as directed. States the steroid is making it harder to sleep at night. Endorses residual chest tightness. Has significant environmental and seasonal allergies but is not taking any thing for it. Endorses history of allergic asthma. Has no inhalers.  Past Medical History  Diagnosis Date  . Thyroid disease     Thryoidectomy  . Pre-eclampsia   . Migraine     Current Outpatient Prescriptions on File Prior to Visit  Medication Sig Dispense Refill  . ibuprofen (ADVIL,MOTRIN) 800 MG tablet Take 800 mg by mouth as needed.    . LO LOESTRIN FE 1 MG-10 MCG / 10 MCG tablet Take 1 tablet by mouth daily.  12  . OVER THE COUNTER MEDICATION Allegra daily.    Marland Kitchen SYNTHROID 175 MCG tablet Take 175 mcg by mouth daily.  2   No current facility-administered medications on file prior to visit.    Allergies  Allergen Reactions  . Latex Anaphylaxis  . Rubbing Alcohol [Alcohol] Anaphylaxis  . Hydrocodone     Suicidal   . Penicillins   . Cephalexin Rash    Family History  Problem Relation Age of Onset  . Heart disease Father   . COPD Father     smoker  . Obesity Mother     Social History   Social History  . Marital Status: Married    Spouse Name: N/A  . Number of Children: 1  . Years of Education: N/A   Occupational History  . Marketing    . Therapist    Social History Main Topics  . Smoking status: Never Smoker   . Smokeless tobacco: Never Used  . Alcohol Use: No  . Drug Use: No  . Sexual Activity: Not  Currently   Other Topics Concern  . None   Social History Narrative   Review of Systems - See HPI.  All other ROS are negative.  BP 110/72 mmHg  Pulse 85  Temp(Src) 98.2 F (36.8 C) (Oral)  Ht 5\' 3"  (1.6 m)  Wt 177 lb 6.4 oz (80.468 kg)  BMI 31.43 kg/m2  SpO2 98%  Physical Exam  Constitutional: She is oriented to person, place, and time and well-developed, well-nourished, and in no distress.  HENT:  Head: Normocephalic and atraumatic.  Right Ear: External ear normal.  Left Ear: External ear normal.  Nose: Nose normal.  Mouth/Throat: Oropharynx is clear and moist. No oropharyngeal exudate.  TM within normal limits bilaterally  Eyes: Conjunctivae are normal.  Neck: Neck supple.  Cardiovascular: Normal rate, regular rhythm, normal heart sounds and intact distal pulses.   Pulmonary/Chest: Effort normal. No respiratory distress. She has wheezes. She has no rales. She exhibits no tenderness.  Neurological: She is alert and oriented to person, place, and time.  Skin: Skin is warm and dry. No rash noted.  Psychiatric: Affect normal.  Vitals reviewed.  Assessment/Plan: 1. Asthma, unspecified asthma severity, with acute exacerbation Continue steroids as directed. Will start Albuterol as directed. Rx Singulair to take daily for allergies.  - albuterol (PROVENTIL) (2.5 MG/3ML) 0.083% nebulizer solution;  Take 3 mLs (2.5 mg total) by nebulization every 6 (six) hours as needed for wheezing or shortness of breath.  Dispense: 150 mL; Refill: 1  2. Asthmatic bronchitis, unspecified asthma severity, uncomplicated Complete antibiotic and steroid pack. Supportive measures and OTC medications reviewed. - albuterol (PROVENTIL HFA;VENTOLIN HFA) 108 (90 Base) MCG/ACT inhaler; Inhale 2 puffs into the lungs every 6 (six) hours as needed for wheezing or shortness of breath.  Dispense: 1 Inhaler; Refill: 0 - montelukast (SINGULAIR) 10 MG tablet; Take 1 tablet (10 mg total) by mouth at bedtime.   Dispense: 30 tablet; Refill: 3  3. Allergic reaction, initial encounter Epi-Pen refilled. Avoid triggers.  - EPINEPHrine 0.3 mg/0.3 mL IJ SOAJ injection; Inject 0.3 mLs (0.3 mg total) into the muscle once.  Dispense: 1 Device; Refill: 0

## 2016-04-24 NOTE — Progress Notes (Signed)
Pre visit review using our clinic review tool, if applicable. No additional management support is needed unless otherwise documented below in the visit note. 

## 2016-04-25 ENCOUNTER — Ambulatory Visit (INDEPENDENT_AMBULATORY_CARE_PROVIDER_SITE_OTHER): Payer: No Typology Code available for payment source | Admitting: Gastroenterology

## 2016-04-25 ENCOUNTER — Encounter: Payer: Self-pay | Admitting: Gastroenterology

## 2016-04-25 VITALS — BP 118/76 | HR 88 | Ht 63.0 in | Wt 175.0 lb

## 2016-04-25 DIAGNOSIS — K641 Second degree hemorrhoids: Secondary | ICD-10-CM | POA: Diagnosis not present

## 2016-04-25 NOTE — Patient Instructions (Signed)
You have been scheduled for your 2nd banding on 05/21/2016 at 3:45pm  [place]   1. The procedure you have had should have been relatively painless since the banding of the area involved does not have nerve endings and there is no pain sensation.  The rubber band cuts off the blood supply to the hemorrhoid and the band may fall off as soon as 48 hours after the banding (the band may occasionally be seen in the toilet bowl following a bowel movement). You may notice a temporary feeling of fullness in the rectum which should respond adequately to plain Tylenol or Motrin.  2. Following the banding, avoid strenuous exercise that evening and resume full activity the next day.  A sitz bath (soaking in a warm tub) or bidet is soothing, and can be useful for cleansing the area after bowel movements.     3. To avoid constipation, take two tablespoons of natural wheat bran, natural oat bran, flax, Benefiber or any over the counter fiber supplement and increase your water intake to 7-8 glasses daily.    4. Unless you have been prescribed anorectal medication, do not put anything inside your rectum for two weeks: No suppositories, enemas, fingers, etc.  5. Occasionally, you may have more bleeding than usual after the banding procedure.  This is often from the untreated hemorrhoids rather than the treated one.  Don't be concerned if there is a tablespoon or so of blood.  If there is more blood than this, lie flat with your bottom higher than your head and apply an ice pack to the area. If the bleeding does not stop within a half an hour or if you feel faint, call our office at [phone] or go to the emergency room.  6. Problems are not common; however, if there is a substantial amount of bleeding, severe pain, chills, fever or difficulty passing urine (very rare) or other problems, you should call us at [phone] or report to the nearest emergency  room.  7. Do not stay seated continuously for more than 2-3 hours for a day or two after the procedure.  Tighten your buttock muscles 10-15 times every two hours and take 10-15 deep breaths every 1-2 hours.  Do not spend more than a few minutes on the toilet if you cannot empty your bowel; instead re-visit the toilet at a later time.

## 2016-04-25 NOTE — Progress Notes (Signed)
PROCEDURE NOTE: The patient presents with symptomatic grade II  hemorrhoids, requesting rubber band ligation of his/her hemorrhoidal disease.  All risks, benefits and alternative forms of therapy were described and informed consent was obtained.  The anorectum was pre-medicated with 0.125% nitroglycerin The decision was made to band the RA internal hemorrhoid, and the [place] was used to perform band ligation without complication.  Digital anorectal examination was then performed to assure proper positioning of the band, and to adjust the banded tissue as required.  The patient was discharged home without pain or other issues.  Dietary and behavioral recommendations were given and along with follow-up instructions.     The following adjunctive treatments were recommended: Daily fiber supplement  The patient will return in 2-3 weeks for  follow-up and possible additional banding as required. No complications were encountered and the patient tolerated the procedure well.  [place] Cellar, MD [facility] Pager [phone]

## 2016-05-21 ENCOUNTER — Encounter: Payer: No Typology Code available for payment source | Admitting: Gastroenterology

## 2016-06-04 ENCOUNTER — Ambulatory Visit (INDEPENDENT_AMBULATORY_CARE_PROVIDER_SITE_OTHER): Payer: No Typology Code available for payment source | Admitting: Gastroenterology

## 2016-06-04 ENCOUNTER — Encounter: Payer: Self-pay | Admitting: Gastroenterology

## 2016-06-04 VITALS — BP 108/74 | HR 84 | Ht 63.0 in | Wt 175.0 lb

## 2016-06-04 DIAGNOSIS — K648 Other hemorrhoids: Secondary | ICD-10-CM | POA: Diagnosis not present

## 2016-06-04 NOTE — Progress Notes (Signed)
PROCEDURE NOTE: The patient presents with symptomatic grade II  hemorrhoids, requesting rubber band ligation of his/her hemorrhoidal disease.  All risks, benefits and alternative forms of therapy were described and informed consent was obtained.   The anorectum was pre-medicated with 0.125% nitroglycerin The decision was made to band the RP internal hemorrhoid, and the [place] was used to perform band ligation without complication.  Digital anorectal examination was then performed to assure proper positioning of the band, and to adjust the banded tissue as required.  The patient was discharged home without pain or other issues.  Dietary and behavioral recommendations were given and along with follow-up instructions.     The following adjunctive treatments were recommended: Daily fiber supplement  The patient will return in 2-4 weeks for  follow-up and possible additional banding as required. No complications were encountered and the patient tolerated the procedure well.  [place] Cellar, MD [facility] Pager [phone]

## 2016-06-04 NOTE — Patient Instructions (Signed)
You have been scheduled for your 3rd hemorrhoidal banding on Friday, 07/05/16 @ 4:00 pm.  HEMORRHOID BANDING PROCEDURE    FOLLOW-UP CARE   1. The procedure you have had should have been relatively painless since the banding of the area involved does not have nerve endings and there is no pain sensation.  The rubber band cuts off the blood supply to the hemorrhoid and the band may fall off as soon as 48 hours after the banding (the band may occasionally be seen in the toilet bowl following a bowel movement). You may notice a temporary feeling of fullness in the rectum which should respond adequately to plain Tylenol or Motrin.  2. Following the banding, avoid strenuous exercise that evening and resume full activity the next day.  A sitz bath (soaking in a warm tub) or bidet is soothing, and can be useful for cleansing the area after bowel movements.     3. To avoid constipation, take two tablespoons of natural wheat bran, natural oat bran, flax, Benefiber or any over the counter fiber supplement and increase your water intake to 7-8 glasses daily.    4. Unless you have been prescribed anorectal medication, do not put anything inside your rectum for two weeks: No suppositories, enemas, fingers, etc.  5. Occasionally, you may have more bleeding than usual after the banding procedure.  This is often from the untreated hemorrhoids rather than the treated one.  Don't be concerned if there is a tablespoon or so of blood.  If there is more blood than this, lie flat with your bottom higher than your head and apply an ice pack to the area. If the bleeding does not stop within a half an hour or if you feel faint, call our office at [phone] or go to the emergency room.  6. Problems are not common; however, if there is a substantial amount of bleeding, severe pain, chills, fever or difficulty passing urine (very rare) or other problems, you should call us at [phone] or report to the nearest  emergency room.  7. Do not stay seated continuously for more than 2-3 hours for a day or two after the procedure.  Tighten your buttock muscles 10-15 times every two hours and take 10-15 deep breaths every 1-2 hours.  Do not spend more than a few minutes on the toilet if you cannot empty your bowel; instead re-visit the toilet at a later time.

## 2016-07-05 ENCOUNTER — Encounter: Payer: Self-pay | Admitting: Gastroenterology

## 2016-07-05 ENCOUNTER — Ambulatory Visit (INDEPENDENT_AMBULATORY_CARE_PROVIDER_SITE_OTHER): Payer: No Typology Code available for payment source | Admitting: Gastroenterology

## 2016-07-05 VITALS — BP 120/70 | HR 80 | Ht 63.0 in | Wt 174.0 lb

## 2016-07-05 DIAGNOSIS — K602 Anal fissure, unspecified: Secondary | ICD-10-CM | POA: Diagnosis not present

## 2016-07-05 MED ORDER — AMBULATORY NON FORMULARY MEDICATION: Status: DC

## 2016-07-05 NOTE — Progress Notes (Signed)
   HPI :   35 y/o female here for final hemorrhoid banding today, her third. She has had benefit from the first two bands, but recently had a painful bowel movement with significant bleeding x a few episodes. Otherwise no other complaints.  Past Medical History  Diagnosis Date  . Thyroid disease     Thryoidectomy  . Pre-eclampsia   . Migraine   . Internal hemorrhoids      Past Surgical History  Procedure Laterality Date  . Thyroid surgery    . Cesarean section    . Hemorrhoid banding     Family History  Problem Relation Age of Onset  . Heart disease Father   . COPD Father     smoker  . Obesity Mother   . Colon cancer Neg Hx   . Esophageal cancer Neg Hx    Social History  Substance Use Topics  . Smoking status: Never Smoker   . Smokeless tobacco: Never Used  . Alcohol Use: No   Current Outpatient Prescriptions  Medication Sig Dispense Refill  . albuterol (PROVENTIL HFA;VENTOLIN HFA) 108 (90 Base) MCG/ACT inhaler Inhale 2 puffs into the lungs every 6 (six) hours as needed for wheezing or shortness of breath. 1 Inhaler 0  . albuterol (PROVENTIL) (2.5 MG/3ML) 0.083% nebulizer solution Take 3 mLs (2.5 mg total) by nebulization every 6 (six) hours as needed for wheezing or shortness of breath. 150 mL 1  . EPINEPHrine 0.3 mg/0.3 mL IJ SOAJ injection Inject 0.3 mLs (0.3 mg total) into the muscle once. 1 Device 0  . ibuprofen (ADVIL,MOTRIN) 800 MG tablet Take 800 mg by mouth as needed.    . LO LOESTRIN FE 1 MG-10 MCG / 10 MCG tablet Take 1 tablet by mouth daily.  12  . montelukast (SINGULAIR) 10 MG tablet Take 1 tablet (10 mg total) by mouth at bedtime. 30 tablet 3  . OVER THE COUNTER MEDICATION Allegra daily.    Marland Kitchen SYNTHROID 175 MCG tablet Take 175 mcg by mouth daily.  2   No current facility-administered medications for this visit.   Allergies  Allergen Reactions  . Latex Anaphylaxis  . Rubbing Alcohol [Alcohol] Anaphylaxis  . Hydrocodone     Suicidal   . Penicillins     . Cephalexin Rash     Review of Systems: All systems reviewed and negative except where noted in HPI.    No results found.  Physical Exam: BP 120/70 mmHg  Pulse 80  Ht 5\' 3"  (1.6 m)  Wt 174 lb (78.926 kg)  BMI 30.83 kg/m2 Constitutional: Pleasant,well-developed, female in no acute distress. DRE - external skin tag Anoscopy - posterior midline anal fissure, internal hemorrhoids  ASSESSMENT AND PLAN: 35 y/o female with history of hemorrhoids, here for 3rd banding, had done well after first two. Now with rectal pain and intermittent bleeding. Anoscopy showed anal fissure, quite painful for the patient. Will not band today. Recommend 0.125% nitroglycerin ointment applied every 8 hours, she will follow up in a month and if this has resolved will then proceed with banding. She agreed.   [place] Cellar, MD [facility] Pager [phone]

## 2016-07-05 NOTE — Patient Instructions (Addendum)
We have sent a prescription for nitroglycerin 0.125% gel to [facility]. You should apply a pea size amount to your rectum three times daily x 6-8 weeks.  [facility]'s information is below: Address: [address], [place], [place] [address]  [phone]  Please follow up with Dr Havery Moros Thursday, 08/22/16 @ 3:30 pm.  If you are age 67 or older, your body mass index should be between 23-30. Your Body mass index is 30.83 kg/(m^2). If this is out of the aforementioned range listed, please consider follow up with your Primary Care Provider.  If you are age 80 or younger, your body mass index should be between 19-25. Your Body mass index is 30.83 kg/(m^2). If this is out of the aformentioned range listed, please consider follow up with your Primary Care Provider.

## 2016-07-08 ENCOUNTER — Encounter: Payer: Self-pay | Admitting: Physician Assistant

## 2016-07-08 ENCOUNTER — Ambulatory Visit (INDEPENDENT_AMBULATORY_CARE_PROVIDER_SITE_OTHER): Payer: No Typology Code available for payment source | Admitting: Physician Assistant

## 2016-07-08 VITALS — BP 112/78 | HR 75 | Temp 98.2°F | Resp 16 | Ht 63.0 in | Wt 175.1 lb

## 2016-07-08 DIAGNOSIS — E039 Hypothyroidism, unspecified: Secondary | ICD-10-CM | POA: Diagnosis not present

## 2016-07-08 DIAGNOSIS — G4700 Insomnia, unspecified: Secondary | ICD-10-CM

## 2016-07-08 LAB — TSH: TSH: 0.9 u[IU]/mL (ref 0.35–4.50)

## 2016-07-08 LAB — T4, FREE: FREE T4: 1.28 ng/dL (ref 0.60–1.60)

## 2016-07-08 MED ORDER — ZOLPIDEM TARTRATE 10 MG PO TABS: 5.0000 mg | ORAL_TABLET | Freq: Every evening | ORAL | Status: DC | PRN

## 2016-07-08 NOTE — Progress Notes (Signed)
Pre visit review using our clinic review tool, if applicable. No additional management support is needed unless otherwise documented below in the visit note/SLS  

## 2016-07-08 NOTE — Patient Instructions (Signed)
Please go to the lab for blood work. I will call you with results.  You will be contacted by Endocrinology for assessment.  Please start the Ambien taking 1/2 tablet each evening for sleep. Let me know how this is working for you.

## 2016-07-08 NOTE — Progress Notes (Signed)
Patient with history of hypothyroidism s/p thyroidectomy (2008) presents to clinic today to discuss referral to new Endocrinologist in the area. Patient is currently on 125 mcg Synthroid daily. Had previously been doing well on this regimen but now notes increased anxiety with insomnia, weight loss despite increase caloric intake, and fatigue. Denies change to stress level recently but notes she is always stressed. Is averaging 2-4 hours of sleep per night.   Past Medical History  Diagnosis Date  . Thyroid disease     Thryoidectomy  . Pre-eclampsia   . Migraine   . Internal hemorrhoids     Current Outpatient Prescriptions on File Prior to Visit  Medication Sig Dispense Refill  . albuterol (PROVENTIL HFA;VENTOLIN HFA) 108 (90 Base) MCG/ACT inhaler Inhale 2 puffs into the lungs every 6 (six) hours as needed for wheezing or shortness of breath. 1 Inhaler 0  . albuterol (PROVENTIL) (2.5 MG/3ML) 0.083% nebulizer solution Take 3 mLs (2.5 mg total) by nebulization every 6 (six) hours as needed for wheezing or shortness of breath. 150 mL 1  . EPINEPHrine 0.3 mg/0.3 mL IJ SOAJ injection Inject 0.3 mLs (0.3 mg total) into the muscle once. 1 Device 0  . ibuprofen (ADVIL,MOTRIN) 800 MG tablet Take 800 mg by mouth as needed.    . LO LOESTRIN FE 1 MG-10 MCG / 10 MCG tablet Take 1 tablet by mouth daily.  12  . montelukast (SINGULAIR) 10 MG tablet Take 1 tablet (10 mg total) by mouth at bedtime. 30 tablet 3  . SYNTHROID 175 MCG tablet Take 175 mcg by mouth daily.  2  . AMBULATORY NON FORMULARY MEDICATION Medication Name: nitroglycerin 0.125% gel to [facility]. You should apply a pea size amount to your rectum three times daily x 6-8 weeks. (Patient not taking: Reported on 07/08/2016) 30 g 0   No current facility-administered medications on file prior to visit.    Allergies  Allergen Reactions  . Latex Anaphylaxis  . Rubbing Alcohol [Alcohol] Anaphylaxis  . Bean Pod Extract Swelling   Butter Bean, Baked Bean: Facial Swelling  . Hydrocodone     Suicidal thoughts with medication  . Penicillins   . Cephalexin Rash    Family History  Problem Relation Age of Onset  . Heart disease Father   . COPD Father     smoker  . Obesity Mother   . Colon cancer Neg Hx   . Esophageal cancer Neg Hx     Social History   Social History  . Marital Status: Married    Spouse Name: N/A  . Number of Children: 1  . Years of Education: N/A   Occupational History  . Marketing    . Therapist    Social History Main Topics  . Smoking status: Never Smoker   . Smokeless tobacco: Never Used  . Alcohol Use: No  . Drug Use: No  . Sexual Activity: Not Currently   Other Topics Concern  . None   Social History Narrative   Review of Systems - See HPI.  All other ROS are negative.  BP 112/78 mmHg  Pulse 75  Temp(Src) 98.2 F (36.8 C) (Oral)  Resp 16  Ht 5\' 3"  (1.6 m)  Wt 175 lb 2 oz (79.436 kg)  BMI 31.03 kg/m2  SpO2 99%  Physical Exam  Constitutional: She is oriented to person, place, and time and well-developed, well-nourished, and in no distress.  HENT:  Head: Normocephalic and atraumatic.  Eyes: Conjunctivae are normal.  Neck: Neck supple. No thyromegaly present.  Thyroid surgically absent  Cardiovascular: Normal rate, regular rhythm, normal heart sounds and intact distal pulses.   Pulmonary/Chest: Effort normal and breath sounds normal. No respiratory distress. She has no wheezes. She has no rales. She exhibits no tenderness.  Lymphadenopathy:    She has no cervical adenopathy.  Neurological: She is alert and oriented to person, place, and time.  Skin: Skin is warm and dry. No rash noted.  Psychiatric: Affect normal.  Vitals reviewed.  Assessment/Plan: 1. Hypothyroidism, unspecified hypothyroidism type Will check Thyroid panel to assess if dosing needs to be changed. Symptoms concerning for over-medication. Referral to Endocrinology placed. MRR form signed so we  can obtain previous medical records from Endo. - TSH - T4, free  2. Insomnia Will begin trial of Ambien while workup in process. Supportive measures reviewed. - zolpidem (AMBIEN) 10 MG tablet; Take 0.5 tablets (5 mg total) by mouth at bedtime as needed for sleep.  Dispense: 15 tablet; Refill: 1   Leeanne Rio, [place]

## 2016-07-10 ENCOUNTER — Telehealth: Payer: Self-pay | Admitting: *Deleted

## 2016-07-10 NOTE — Telephone Encounter (Signed)
Forwarded to [place]. JG//CMA 

## 2016-07-22 ENCOUNTER — Encounter: Payer: Self-pay | Admitting: Gastroenterology

## 2016-07-30 ENCOUNTER — Encounter: Payer: Self-pay | Admitting: Internal Medicine

## 2016-08-22 ENCOUNTER — Encounter: Payer: No Typology Code available for payment source | Admitting: Gastroenterology

## 2016-09-05 ENCOUNTER — Other Ambulatory Visit: Payer: Self-pay | Admitting: Physician Assistant

## 2016-09-05 DIAGNOSIS — J45909 Unspecified asthma, uncomplicated: Secondary | ICD-10-CM

## 2016-09-05 MED ORDER — MONTELUKAST SODIUM 10 MG PO TABS: 10.0000 mg | ORAL_TABLET | Freq: Every day | ORAL | 0 refills | Status: DC

## 2016-09-06 ENCOUNTER — Encounter: Payer: Self-pay | Admitting: Physician Assistant

## 2016-09-06 ENCOUNTER — Ambulatory Visit (INDEPENDENT_AMBULATORY_CARE_PROVIDER_SITE_OTHER): Payer: No Typology Code available for payment source | Admitting: Physician Assistant

## 2016-09-06 DIAGNOSIS — G4700 Insomnia, unspecified: Secondary | ICD-10-CM | POA: Diagnosis not present

## 2016-09-06 MED ORDER — ZOLPIDEM TARTRATE 10 MG PO TABS: 5.0000 mg | ORAL_TABLET | Freq: Every evening | ORAL | 2 refills | Status: DC | PRN

## 2016-09-06 NOTE — Progress Notes (Signed)
Patient presents to clinic today for follow-up of insomnia. Currently on Ambien PRN for sleep. Sleep issues stem from hypervigilance at night 2/2 PTSD. She has been working with therapist, currently on week 3 of sessions and is making improvement with sleep. Has sessions weekly for 9 more weeks to help her with restful sleep on her own. Only taking Ambien when she is going more than one night without sleep.   Past Medical History:  Diagnosis Date  . Internal hemorrhoids   . Migraine   . Pre-eclampsia   . Thyroid disease    Thryoidectomy    Current Outpatient Prescriptions on File Prior to Visit  Medication Sig Dispense Refill  . albuterol (PROVENTIL HFA;VENTOLIN HFA) 108 (90 Base) MCG/ACT inhaler Inhale 2 puffs into the lungs every 6 (six) hours as needed for wheezing or shortness of breath. 1 Inhaler 0  . albuterol (PROVENTIL) (2.5 MG/3ML) 0.083% nebulizer solution Take 3 mLs (2.5 mg total) by nebulization every 6 (six) hours as needed for wheezing or shortness of breath. 150 mL 1  . AMBULATORY NON FORMULARY MEDICATION Medication Name: nitroglycerin 0.125% gel to [facility]. You should apply a pea size amount to your rectum three times daily x 6-8 weeks. 30 g 0  . EPINEPHrine 0.3 mg/0.3 mL IJ SOAJ injection Inject 0.3 mLs (0.3 mg total) into the muscle once. 1 Device 0  . ibuprofen (ADVIL,MOTRIN) 800 MG tablet Take 800 mg by mouth as needed.    . LO LOESTRIN FE 1 MG-10 MCG / 10 MCG tablet Take 1 tablet by mouth daily.  12  . Melatonin 5 MG CAPS Take by mouth at bedtime as needed.    . montelukast (SINGULAIR) 10 MG tablet Take 1 tablet (10 mg total) by mouth at bedtime. 90 tablet 0  . SYNTHROID 175 MCG tablet Take 175 mcg by mouth daily.  2  . zolpidem (AMBIEN) 10 MG tablet Take 0.5 tablets (5 mg total) by mouth at bedtime as needed for sleep. 15 tablet 1   No current facility-administered medications on file prior to visit.     Allergies  Allergen Reactions  . Latex  Anaphylaxis  . Rubbing Alcohol [Alcohol] Anaphylaxis  . Bean Pod Extract Swelling    Butter Bean, Baked Bean: Facial Swelling  . Hydrocodone     Suicidal thoughts with medication  . Penicillins   . Cephalexin Rash    Family History  Problem Relation Age of Onset  . Heart disease Father   . COPD Father     smoker  . Obesity Mother   . Colon cancer Neg Hx   . Esophageal cancer Neg Hx     Social History   Social History  . Marital status: Married    Spouse name: N/A  . Number of children: 1  . Years of education: N/A   Occupational History  . Marketing    . Therapist    Social History Main Topics  . Smoking status: Never Smoker  . Smokeless tobacco: Never Used  . Alcohol use No  . Drug use: No  . Sexual activity: Not Currently   Other Topics Concern  . None   Social History Narrative  . None    Review of Systems - See HPI.  All other ROS are negative.  BP 116/82 (BP Location: Left Arm, Patient Position: Sitting, Cuff Size: Normal)   Pulse 73   Temp 98.3 F (36.8 C) (Oral)   Resp 16   Ht 5\' 3"  (  1.6 m)   Wt 179 lb (81.2 kg)   SpO2 99%   BMI 31.71 kg/m   Physical Exam  Constitutional: She is oriented to person, place, and time and well-developed, well-nourished, and in no distress.  HENT:  Head: Normocephalic and atraumatic.  Eyes: Conjunctivae are normal.  Cardiovascular: Normal rate, regular rhythm, normal heart sounds and intact distal pulses.   Pulmonary/Chest: Effort normal and breath sounds normal. No respiratory distress. She has no wheezes. She has no rales. She exhibits no tenderness.  Neurological: She is alert and oriented to person, place, and time.  Psychiatric: Affect normal.  Vitals reviewed.  Recent Results (from the past 2160 hour(s))  TSH     Status: None   Collection Time: 07/08/16 12:50 PM  Result Value Ref Range   TSH 0.90 0.35 - 4.50 uIU/mL  T4, free     Status: None   Collection Time: 07/08/16 12:50 PM  Result Value Ref  Range   Free T4 1.28 0.60 - 1.60 ng/dL   Assessment/Plan: 1. Insomnia 2/2 PTSD. Is working with counseling and making great improvements. Ambien refilled. We are decreasing use of medication. - zolpidem (AMBIEN) 10 MG tablet; Take 0.5 tablets (5 mg total) by mouth at bedtime as needed for sleep.  Dispense: 15 tablet; Refill: 2   Leeanne Rio, PA-C

## 2016-09-06 NOTE — Patient Instructions (Signed)
Please continue medication as directed in combination with therapy.  We should be able to cut down on the use here in the near future. If we have to continue medication for insomnia, we will need to consider alternatives.

## 2016-09-13 ENCOUNTER — Ambulatory Visit: Payer: Self-pay | Admitting: Physician Assistant

## 2016-10-03 ENCOUNTER — Encounter: Payer: Self-pay | Admitting: Gastroenterology

## 2016-10-03 ENCOUNTER — Ambulatory Visit (INDEPENDENT_AMBULATORY_CARE_PROVIDER_SITE_OTHER): Payer: No Typology Code available for payment source | Admitting: Gastroenterology

## 2016-10-03 VITALS — BP 116/70 | HR 80 | Ht 63.0 in | Wt 178.1 lb

## 2016-10-03 DIAGNOSIS — K641 Second degree hemorrhoids: Secondary | ICD-10-CM | POA: Diagnosis not present

## 2016-10-03 NOTE — Patient Instructions (Signed)

## 2016-10-03 NOTE — Progress Notes (Signed)
PROCEDURE NOTE: The patient presents with symptomatic grade II hemorrhoids, requesting rubber band ligation of his/her hemorrhoidal disease.  All risks, benefits and alternative forms of therapy were described and informed consent was obtained.  The anorectum was pre-medicated with 0.125% nitroglycerin The decision was made to band the LL internal hemorrhoid, and the [place] was used to perform band ligation without complication.  Digital anorectal examination was then performed to assure proper positioning of the band, and to adjust the banded tissue as required.  The patient was discharged home without pain or other issues.  Dietary and behavioral recommendations were given and along with follow-up instructions.     The following adjunctive treatments were recommended: Daily fiber supplement  The patient will return as needed for further therapy, no further scheduled bandings as she has now completed 3 of them with good symptomatic relief. No complications were encountered and the patient tolerated the procedure well. Follow up as needed.   Conroy Cellar, MD [facility] Gastroenterology Pager [phone]

## 2016-10-21 ENCOUNTER — Encounter: Payer: Self-pay | Admitting: Physician Assistant

## 2016-10-21 ENCOUNTER — Ambulatory Visit (INDEPENDENT_AMBULATORY_CARE_PROVIDER_SITE_OTHER): Payer: No Typology Code available for payment source | Admitting: Physician Assistant

## 2016-10-21 ENCOUNTER — Other Ambulatory Visit: Payer: Self-pay | Admitting: Physician Assistant

## 2016-10-21 VITALS — BP 108/74 | HR 93 | Temp 98.5°F | Resp 16 | Ht 63.0 in | Wt 176.4 lb

## 2016-10-21 DIAGNOSIS — B9689 Other specified bacterial agents as the cause of diseases classified elsewhere: Secondary | ICD-10-CM

## 2016-10-21 DIAGNOSIS — J208 Acute bronchitis due to other specified organisms: Secondary | ICD-10-CM

## 2016-10-21 MED ORDER — PSEUDOEPH-BROMPHEN-DM 30-2-10 MG/5ML PO SYRP: 5.0000 mL | ORAL_SOLUTION | Freq: Four times a day (QID) | ORAL | 0 refills | Status: DC | PRN

## 2016-10-21 MED ORDER — DOXYCYCLINE HYCLATE 100 MG PO CAPS: 100.0000 mg | ORAL_CAPSULE | Freq: Two times a day (BID) | ORAL | 0 refills | Status: DC

## 2016-10-21 NOTE — Patient Instructions (Signed)
Take antibiotic (Doxycycline) as directed.  Increase fluids.  Get plenty of rest. Use Mucinex for congestion. Continue your cough syrup. Restart albuterol. Take a daily probiotic (I recommend Align or Culturelle, but even Activia Yogurt may be beneficial).  A humidifier placed in the bedroom may offer some relief for a dry, scratchy throat of nasal irritation.  Read information below on acute bronchitis. Please call or return to clinic if symptoms are not improving.  Acute Bronchitis Bronchitis is when the airways that extend from the windpipe into the lungs get red, puffy, and painful (inflamed). Bronchitis often causes thick spit (mucus) to develop. This leads to a cough. A cough is the most common symptom of bronchitis. In acute bronchitis, the condition usually begins suddenly and goes away over time (usually in 2 weeks). Smoking, allergies, and asthma can make bronchitis worse. Repeated episodes of bronchitis may cause more lung problems.  HOME CARE  Rest.  Drink enough fluids to keep your pee (urine) clear or pale yellow (unless you need to limit fluids as told by your doctor).  Only take over-the-counter or prescription medicines as told by your doctor.  Avoid smoking and secondhand smoke. These can make bronchitis worse. If you are a smoker, think about using nicotine gum or skin patches. Quitting smoking will help your lungs heal faster.  Reduce the chance of getting bronchitis again by:  Washing your hands often.  Avoiding people with cold symptoms.  Trying not to touch your hands to your mouth, nose, or eyes.  Follow up with your doctor as told.  GET HELP IF: Your symptoms do not improve after 1 week of treatment. Symptoms include:  Cough.  Fever.  Coughing up thick spit.  Body aches.  Chest congestion.  Chills.  Shortness of breath.  Sore throat.  GET HELP RIGHT AWAY IF:   You have an increased fever.  You have chills.  You have severe shortness of  breath.  You have bloody thick spit (sputum).  You throw up (vomit) often.  You lose too much body fluid (dehydration).  You have a severe headache.  You faint.  MAKE SURE YOU:   Understand these instructions.  Will watch your condition.  Will get help right away if you are not doing well or get worse. Document Released: 06/03/2008 Document Revised: 08/18/2013 Document Reviewed: 06/08/2013 [facility] Patient Information 2015 [place], [place]. This information is not intended to replace advice given to you by your health care provider. Make sure you discuss any questions you have with your health care provider.

## 2016-10-21 NOTE — Progress Notes (Signed)
Patient presents to clinic today c/o 3 weeks of head congestion, sinus pressure, chest congestion with deep cough. Endorses symptoms improved for a few days after the first couple of weeks, then acutely worsened. Denies chest pain or SOB. Notes chest tightness. Has not been using her albuterol. Has taken Tylenol and Ibuprofen for aches. Denies recent travel or sick contact.   Past Medical History:  Diagnosis Date  . Internal hemorrhoids   . Migraine   . Pre-eclampsia   . Thyroid disease    Thryoidectomy    Current Outpatient Prescriptions on File Prior to Visit  Medication Sig Dispense Refill  . albuterol (PROVENTIL HFA;VENTOLIN HFA) 108 (90 Base) MCG/ACT inhaler Inhale 2 puffs into the lungs every 6 (six) hours as needed for wheezing or shortness of breath. 1 Inhaler 0  . albuterol (PROVENTIL) (2.5 MG/3ML) 0.083% nebulizer solution Take 3 mLs (2.5 mg total) by nebulization every 6 (six) hours as needed for wheezing or shortness of breath. 150 mL 1  . EPINEPHrine 0.3 mg/0.3 mL IJ SOAJ injection Inject 0.3 mLs (0.3 mg total) into the muscle once. 1 Device 0  . ibuprofen (ADVIL,MOTRIN) 800 MG tablet Take 800 mg by mouth as needed.    . LO LOESTRIN FE 1 MG-10 MCG / 10 MCG tablet Take 1 tablet by mouth daily.  12  . Melatonin 5 MG CAPS Take by mouth at bedtime as needed.    . montelukast (SINGULAIR) 10 MG tablet Take 1 tablet (10 mg total) by mouth at bedtime. 90 tablet 0  . SYNTHROID 175 MCG tablet Take 175 mcg by mouth daily.  2  . zolpidem (AMBIEN) 10 MG tablet Take 0.5 tablets (5 mg total) by mouth at bedtime as needed for sleep. 15 tablet 2   No current facility-administered medications on file prior to visit.     Allergies  Allergen Reactions  . Latex Anaphylaxis  . Rubbing Alcohol [Alcohol] Anaphylaxis  . Bean Pod Extract Swelling    Butter Bean, Baked Bean: Facial Swelling  . Hydrocodone     Suicidal thoughts with medication  . Penicillins   . Cephalexin Rash    Family  History  Problem Relation Age of Onset  . Heart disease Father   . COPD Father     smoker  . Obesity Mother   . Colon cancer Neg Hx   . Esophageal cancer Neg Hx     Social History   Social History  . Marital status: Married    Spouse name: N/A  . Number of children: 1  . Years of education: N/A   Occupational History  . Marketing    . Therapist    Social History Main Topics  . Smoking status: Never Smoker  . Smokeless tobacco: Never Used  . Alcohol use No  . Drug use: No  . Sexual activity: Not Currently   Other Topics Concern  . None   Social History Narrative  . None    Review of Systems - See HPI.  All other ROS are negative.  BP 108/74 (BP Location: Right Arm, Patient Position: Sitting, Cuff Size: Normal)   Pulse 93   Temp 98.5 F (36.9 C) (Oral)   Resp 16   Ht 5\' 3"  (1.6 m)   Wt 176 lb 6 oz (80 kg)   SpO2 98%   BMI 31.24 kg/m   Physical Exam  Constitutional: She is oriented to person, place, and time and well-developed, well-nourished, and in no distress.  HENT:  Head:  Normocephalic and atraumatic.  Right Ear: External ear normal.  Left Ear: External ear normal.  Nose: Nose normal.  Mouth/Throat: Oropharynx is clear and moist. No oropharyngeal exudate.  Eyes: Conjunctivae are normal.  Neck: Neck supple.  Cardiovascular: Normal rate, regular rhythm, normal heart sounds and intact distal pulses.   Pulmonary/Chest: Effort normal and breath sounds normal. No respiratory distress. She has no wheezes. She has no rales. She exhibits no tenderness.  Neurological: She is alert and oriented to person, place, and time.  Skin: Skin is warm and dry. No rash noted.  Psychiatric: Affect normal.  Vitals reviewed.  Assessment/Plan: 1. Acute bacterial bronchitis Rx doxycycline. Increase fluids. Restart Albuterol. Continue cough syrup. Supportive measures reviewed. FU if not resolving. - doxycycline (VIBRAMYCIN) 100 MG capsule; Take 1 capsule (100 mg total) by  mouth 2 (two) times daily.  Dispense: 14 capsule; Refill: 0   Leeanne Rio, [place]

## 2016-12-04 ENCOUNTER — Other Ambulatory Visit: Payer: Self-pay | Admitting: Physician Assistant

## 2016-12-04 DIAGNOSIS — J45909 Unspecified asthma, uncomplicated: Secondary | ICD-10-CM

## 2016-12-04 NOTE — Telephone Encounter (Signed)
Refill sent per [facility] refill protocol/SLS  

## 2017-01-07 ENCOUNTER — Telehealth: Payer: Self-pay | Admitting: Physician Assistant

## 2017-01-07 MED ORDER — ALPRAZOLAM 0.25 MG PO TABS: 0.2500 mg | ORAL_TABLET | Freq: Two times a day (BID) | ORAL | 0 refills | Status: DC | PRN

## 2017-01-07 NOTE — Telephone Encounter (Signed)
Pt calling asking if Jacqueline Wilson would call in one tablet of xanax to help her get through the weekend coming up. Pt is to go to sisters wedding and will be around family that estranged from and is feeling very nervous. [facility] [address]

## 2017-01-07 NOTE — Telephone Encounter (Signed)
I will give 3 tablets to have on hand to use as directed if needed.  Do not drive or operate heavy machinery while on medication.  No alcohol with medication.   I have printed and sign. Please contact patient to inform her of medication. Verify she still has IUD or any concerns of pregnancy before starting this medication.

## 2017-01-07 NOTE — Telephone Encounter (Signed)
Advised patient Jacqueline Wilson approved 3 pills for the weekend. Advised of side effects, things not to do.  Patient states she still has her IUD and not pregnant. She is agreeable with the caution of medication. Faxed rx to preferred pharmacy-[facility]

## 2017-02-07 ENCOUNTER — Telehealth: Payer: Self-pay | Admitting: Physician Assistant

## 2017-02-07 MED ORDER — SYNTHROID 175 MCG PO TABS: 175.0000 ug | ORAL_TABLET | Freq: Every day | ORAL | 2 refills | Status: DC

## 2017-02-07 NOTE — Telephone Encounter (Signed)
Patient would like SYNTHROID 175 MCG tablet refilled. She would like the same medication and dosage. Please call patient to advise if she can have this done by [facility].   Thank you

## 2017-02-07 NOTE — Telephone Encounter (Signed)
Spoke with patient advised will refill medication, just need re-evaluate her thyroid levels. She is agreeable.[facility] and patient is schedule for 02/12/17.

## 2017-02-12 ENCOUNTER — Ambulatory Visit: Payer: No Typology Code available for payment source | Admitting: Physician Assistant

## 2017-03-05 ENCOUNTER — Telehealth: Payer: Self-pay | Admitting: Physician Assistant

## 2017-03-05 ENCOUNTER — Ambulatory Visit (INDEPENDENT_AMBULATORY_CARE_PROVIDER_SITE_OTHER): Payer: No Typology Code available for payment source | Admitting: Physician Assistant

## 2017-03-05 ENCOUNTER — Encounter: Payer: Self-pay | Admitting: Physician Assistant

## 2017-03-05 VITALS — BP 110/80 | HR 71 | Temp 98.4°F | Resp 14 | Ht 63.0 in | Wt 181.0 lb

## 2017-03-05 DIAGNOSIS — F5101 Primary insomnia: Secondary | ICD-10-CM

## 2017-03-05 DIAGNOSIS — Z6832 Body mass index (BMI) 32.0-32.9, adult: Secondary | ICD-10-CM

## 2017-03-05 DIAGNOSIS — M25572 Pain in left ankle and joints of left foot: Secondary | ICD-10-CM

## 2017-03-05 DIAGNOSIS — E039 Hypothyroidism, unspecified: Secondary | ICD-10-CM

## 2017-03-05 DIAGNOSIS — E669 Obesity, unspecified: Secondary | ICD-10-CM | POA: Diagnosis not present

## 2017-03-05 LAB — TSH: TSH: 0.44 m[IU]/L

## 2017-03-05 NOTE — Progress Notes (Signed)
Pre visit review using our clinic review tool, if applicable. No additional management support is needed unless otherwise documented below in the visit note. 

## 2017-03-05 NOTE — Telephone Encounter (Signed)
Pt called at 3:26 this afternoon to state that she wanted all lab work to be sent to [place], that she forgot to tell us at her appt this morning.

## 2017-03-05 NOTE — Progress Notes (Signed)
Patient presents to clinic today for follow-up of chronic medical issues and acute concerns.  Weight Loss Pt is concerned that she has hit a weight plateau and has been unable to lose weight for months. Pt states she is exercising more than normal, reports she is running 2-3 times throughout the day for 5-10 minutes each, and running up/down stairs a few times each day. States she is workout out with a personal trainer twice a week and working out at home every day. Pt additionally states she is currently doing Weight Watchers diet, has been purchasing meals from a nutritionist. Pts concern is mainly about her core, states she "can't get rid of the belly fat." Pt states that in the past she had back pain that was due to her weight, and is concerned that she will begin having more back pain if she doesn't lose more weight. Pt has 36 y/o daughter that she "needs to be able to keep up with w/o having back pain."  Ankle Pain Pt states that for the past two weeks has had left medial ankle swelling and pain. States pain/swelling occur every day after working out. Has been using ice and Ibuprofen with relief at the end of the day, however pain/swelling come back next day after working out. Denies any trauma or injury to the ankle while exercising. Denies bruising, heat, or any skin changes with swelling. Pt states that when she steps down on the left heel, a shooting pain goes up medial ankle. Pt had issues prior to ankle swelling with shin splints, states those have resolved. Pt has spoken with personal trainer about ankle issue, who put her on low-impact exercises. However pt does not have access to elliptical when working out at home.  Insomnia Pt states that occasional Ambien use has greatly improved her quality of life. Reports using Ambien 1-2 times a month only as needed. States that stress was greatly affecting sleep, but has improved with use of medication. Pt denies morning grogginess or sleepwalking,  states husband is currently watching for side effects.   Denies SOB, wheezing, diarrhea, constipation, chest pain, palpitations, anxiety, stress, depression, SI/HI.  Body mass index is 32.06 kg/m.  Past Medical History:  Diagnosis Date  . Internal hemorrhoids   . Migraine   . Pre-eclampsia   . Thyroid disease    Thryoidectomy    Current Outpatient Prescriptions on File Prior to Visit  Medication Sig Dispense Refill  . albuterol (PROVENTIL HFA;VENTOLIN HFA) 108 (90 Base) MCG/ACT inhaler Inhale 2 puffs into the lungs every 6 (six) hours as needed for wheezing or shortness of breath. 1 Inhaler 0  . albuterol (PROVENTIL) (2.5 MG/3ML) 0.083% nebulizer solution Take 3 mLs (2.5 mg total) by nebulization every 6 (six) hours as needed for wheezing or shortness of breath. 150 mL 1  . EPINEPHrine 0.3 mg/0.3 mL IJ SOAJ injection Inject 0.3 mLs (0.3 mg total) into the muscle once. 1 Device 0  . ibuprofen (ADVIL,MOTRIN) 800 MG tablet Take 800 mg by mouth as needed.    . LO LOESTRIN FE 1 MG-10 MCG / 10 MCG tablet Take 1 tablet by mouth daily.  12  . Melatonin 5 MG CAPS Take by mouth at bedtime as needed.    . montelukast (SINGULAIR) 10 MG tablet TAKE 1 TABLET (10 MG TOTAL) BY MOUTH AT BEDTIME. 90 tablet 0  . SYNTHROID 175 MCG tablet Take 1 tablet (175 mcg total) by mouth daily. 30 tablet 2  . zolpidem (AMBIEN) 10 MG  tablet Take 0.5 tablets (5 mg total) by mouth at bedtime as needed for sleep. 15 tablet 2   No current facility-administered medications on file prior to visit.     Allergies  Allergen Reactions  . Latex Anaphylaxis  . Rubbing Alcohol [Alcohol] Anaphylaxis  . Topamax [Topiramate] Other (See Comments)    HI/SI  . Bean Pod Extract Swelling    Butter Bean, Baked Bean: Facial Swelling  . Hydrocodone     Suicidal thoughts with medication  . Penicillins   . Cephalexin Rash    Family History  Problem Relation Age of Onset  . Heart disease Father   . COPD Father     smoker  .  Obesity Mother   . Colon cancer Neg Hx   . Esophageal cancer Neg Hx     Social History   Social History  . Marital status: Married    Spouse name: N/A  . Number of children: 1  . Years of education: N/A   Occupational History  . Marketing    . Therapist    Social History Main Topics  . Smoking status: Never Smoker  . Smokeless tobacco: Never Used  . Alcohol use No  . Drug use: No  . Sexual activity: Not Currently    Birth control/ protection: Pill   Other Topics Concern  . None   Social History Narrative  . None   Review of Systems - See HPI.  All other ROS are negative.  BP 110/80   Pulse 71   Temp 98.4 F (36.9 C) (Oral)   Resp 14   Ht 5\' 3"  (1.6 m)   Wt 181 lb (82.1 kg)   SpO2 99%   BMI 32.06 kg/m   Physical Exam  Constitutional: She is oriented to person, place, and time and well-developed, well-nourished, and in no distress.  HENT:  Head: Normocephalic and atraumatic.  Eyes: Conjunctivae are normal.  Neck: Neck supple.  Cardiovascular: Normal rate, regular rhythm, normal heart sounds and intact distal pulses.   Pulmonary/Chest: Effort normal and breath sounds normal. No respiratory distress. She has no wheezes. She has no rales. She exhibits no tenderness.  Musculoskeletal:       Left ankle: She exhibits normal range of motion, no swelling, no ecchymosis, no deformity, no laceration and normal pulse. Achilles tendon normal. Achilles tendon exhibits no pain.  Mild tenderness noted superior to medial malleolus. ROM within normal limits without pain.   Neurological: She is alert and oriented to person, place, and time.  Skin: Skin is warm and dry. No rash noted.  Psychiatric: Affect normal.  Vitals reviewed.  Assessment/Plan: Hypothyroidism Taking medication as directed. TSH today.   Acute left ankle pain After heavy exercise only. Mild swelling and pain resolving with ice and elevation. Occasional ibuprofen. Is not giving ankle time to heal before  hitting hard with impact exercises again. Stop exercise over the next week. RICE discussed. Medications reviewed. Low suspicion for stress fracture but if not improving quickly will need imaging.   Class 1 obesity without serious comorbidity with body mass index (BMI) of 32.0 to 32.9 in adult Doing well with diet and exercise. Discussed referral to obesity specialist. She will call to schedule an appointment with Dr. Leafy Ro   Insomnia Doing very well. Continue current regimen.     Leeanne Rio, PA-C

## 2017-03-05 NOTE — Telephone Encounter (Signed)
Noted -  Labs have been switched over.

## 2017-03-05 NOTE — Patient Instructions (Signed)
Please go to the lab for blood work.  I will call you with your results.   You will be contacted by Neurology for further assessment of chronic migraines since you have not followed up with your previous specialist.   Please take an Excedrin Migraine for the acute migraine. Stay hydrated.   I want you to take a week off from your exercise regimen to let your ankle heal. Wear the ACE wrap daily and supportive footwear.  Elevate the ankle when resting. Ice if swelling is noted.  If not resolving we will need imaging and/or sports medicine assessment.   Please call Dr. Leafy Ro at the number given to you today to set up a new patient appointment to discuss weight loss.

## 2017-03-06 ENCOUNTER — Encounter: Payer: Self-pay | Admitting: Emergency Medicine

## 2017-03-06 DIAGNOSIS — Z6832 Body mass index (BMI) 32.0-32.9, adult: Secondary | ICD-10-CM

## 2017-03-06 DIAGNOSIS — E039 Hypothyroidism, unspecified: Secondary | ICD-10-CM | POA: Insufficient documentation

## 2017-03-06 DIAGNOSIS — E669 Obesity, unspecified: Secondary | ICD-10-CM | POA: Insufficient documentation

## 2017-03-06 DIAGNOSIS — M25572 Pain in left ankle and joints of left foot: Secondary | ICD-10-CM | POA: Insufficient documentation

## 2017-03-06 NOTE — Assessment & Plan Note (Signed)
Taking medication as directed. TSH today.

## 2017-03-06 NOTE — Assessment & Plan Note (Signed)
Doing very well. Continue current regimen.  

## 2017-03-06 NOTE — Assessment & Plan Note (Signed)
After heavy exercise only. Mild swelling and pain resolving with ice and elevation. Occasional ibuprofen. Is not giving ankle time to heal before hitting hard with impact exercises again. Stop exercise over the next week. RICE discussed. Medications reviewed. Low suspicion for stress fracture but if not improving quickly will need imaging.

## 2017-03-06 NOTE — Assessment & Plan Note (Signed)
Doing well with diet and exercise. Discussed referral to obesity specialist. She will call to schedule an appointment with Dr. Leafy Ro

## 2017-03-11 ENCOUNTER — Other Ambulatory Visit: Payer: Self-pay | Admitting: Physician Assistant

## 2017-03-11 ENCOUNTER — Encounter: Payer: Self-pay | Admitting: Obstetrics and Gynecology

## 2017-03-11 ENCOUNTER — Ambulatory Visit (INDEPENDENT_AMBULATORY_CARE_PROVIDER_SITE_OTHER): Payer: No Typology Code available for payment source | Admitting: Obstetrics and Gynecology

## 2017-03-11 VITALS — BP 110/80 | HR 90 | Ht 63.0 in | Wt 178.0 lb

## 2017-03-11 DIAGNOSIS — Z30430 Encounter for insertion of intrauterine contraceptive device: Secondary | ICD-10-CM | POA: Diagnosis not present

## 2017-03-11 DIAGNOSIS — G43119 Migraine with aura, intractable, without status migrainosus: Secondary | ICD-10-CM | POA: Diagnosis not present

## 2017-03-11 DIAGNOSIS — Z30431 Encounter for routine checking of intrauterine contraceptive device: Secondary | ICD-10-CM | POA: Diagnosis not present

## 2017-03-11 DIAGNOSIS — J45909 Unspecified asthma, uncomplicated: Secondary | ICD-10-CM

## 2017-03-11 MED ORDER — MISOPROSTOL 100 MCG PO TABS: 100.0000 ug | ORAL_TABLET | Freq: Once | ORAL | 0 refills | Status: AC

## 2017-03-11 NOTE — Addendum Note (Signed)
Addended by: Ardeth Perfect B on: 08/09/314 01:43 PM   Modules accepted: Miquel Dunn

## 2017-03-11 NOTE — Addendum Note (Signed)
Addended by: Ardeth Perfect B on: 05/08/2584 10:30 AM   Modules accepted: Orders

## 2017-03-11 NOTE — Progress Notes (Addendum)
    HPI:      Ms. Jacqueline Wilson is a 36 y.o. No obstetric history on file. who LMP was No LMP recorded (lmp unknown). Patient is not currently having periods (Reason: IUD)., presents today for [facility] conference. She has the mirena placed 4/13 and is on Lo Loestrin OCPs for migraines without aura, per Dr. Cristino Martes. She would like another IUD because she is happy not having menses. She is also interested in [place]. Husband wont' have vasectomy.   Last annual 7/17.   She is on Lo Loestrin continuous dosing for menstrual migraines with aura and migraines without aura. This was controlling her sx, but her headaches have changed since I last saw her 7/17. She now has "ice pick" migraines with visual changes. She is seeing neuro in near future.   Past Medical History:  Diagnosis Date  . Internal hemorrhoids   . Migraine   . Pre-eclampsia   . Thyroid disease    Thryoidectomy    Past Surgical History:  Procedure Laterality Date  . CESAREAN SECTION    . HEMORRHOID BANDING    . THYROID SURGERY      Family History  Problem Relation Age of Onset  . Heart disease Father   . COPD Father     smoker  . Obesity Mother   . Colon cancer Neg Hx   . Esophageal cancer Neg Hx      ROS:  Review of Systems  Constitutional: Negative for fever, malaise/fatigue and weight loss.  Gastrointestinal: Negative for blood in stool, constipation, diarrhea, nausea and vomiting.  Genitourinary: Negative for dysuria, flank pain, frequency, hematuria and urgency.  Musculoskeletal: Negative for back pain.  Skin: Negative for itching and rash.  Neurological: Positive for headaches.    Objective: BP 110/80 (Patient Position: Sitting)   Pulse 90   Ht 5\' 3"  (1.6 m)   Wt 178 lb (80.7 kg)   LMP  (LMP Unknown) Comment: no menses c iud  BMI 31.53 kg/m    OBGyn Exam  NO EXAM DONE.  Assessment/Plan: Encounter for routine checking of intrauterine contraceptive device (IUD) - Pt would like replacement IUD. RTO for  removal/reinsertion. NSAIDs/cytotec 1 hr before. Rx sent.   Intractable migraine with aura without status migrainosus - Pt on Lo Loestrin. Seeing neuro. F/u prn.  Encounter for IUD insertion - Plan: misoprostol (CYTOTEC) 100 MCG tablet    F/U  Return in about 2 weeks (around 03/25/2017) for Mirena rem/reinsertion.  Hanna Aultman B. Kaylon Laroche, PA-C 03/11/2017 10:30 AM

## 2017-03-21 ENCOUNTER — Other Ambulatory Visit: Payer: Self-pay | Admitting: Physician Assistant

## 2017-03-21 ENCOUNTER — Encounter: Payer: Self-pay | Admitting: Physician Assistant

## 2017-03-21 DIAGNOSIS — G43809 Other migraine, not intractable, without status migrainosus: Secondary | ICD-10-CM

## 2017-03-27 ENCOUNTER — Other Ambulatory Visit: Payer: Self-pay | Admitting: Obstetrics and Gynecology

## 2017-03-27 ENCOUNTER — Other Ambulatory Visit (INDEPENDENT_AMBULATORY_CARE_PROVIDER_SITE_OTHER): Payer: No Typology Code available for payment source

## 2017-03-27 ENCOUNTER — Encounter: Payer: Self-pay | Admitting: Obstetrics and Gynecology

## 2017-03-27 ENCOUNTER — Ambulatory Visit (INDEPENDENT_AMBULATORY_CARE_PROVIDER_SITE_OTHER): Payer: No Typology Code available for payment source | Admitting: Obstetrics and Gynecology

## 2017-03-27 VITALS — BP 120/80 | HR 77 | Ht 63.0 in | Wt 172.0 lb

## 2017-03-27 DIAGNOSIS — Z30431 Encounter for routine checking of intrauterine contraceptive device: Secondary | ICD-10-CM | POA: Diagnosis not present

## 2017-03-27 DIAGNOSIS — Z30430 Encounter for insertion of intrauterine contraceptive device: Secondary | ICD-10-CM | POA: Diagnosis not present

## 2017-03-27 DIAGNOSIS — T839XXA Unspecified complication of genitourinary prosthetic device, implant and graft, initial encounter: Secondary | ICD-10-CM

## 2017-03-27 MED ORDER — LEVONORGESTREL 20 MCG/24HR IU IUD: 1.0000 | INTRAUTERINE_SYSTEM | Freq: Once | INTRAUTERINE | 0 refills | Status: DC

## 2017-03-27 MED ORDER — MISOPROSTOL 100 MCG PO TABS: 100.0000 ug | ORAL_TABLET | Freq: Once | ORAL | 0 refills | Status: AC

## 2017-03-27 NOTE — Progress Notes (Signed)
      History of Present Illness:  Jacqueline Wilson is a 36 y.o. that had a Mirena IUD placed approximately 5 years ago. Since that time, she had done really well and would like another one. The IUD strings aren't visible and she had an u/s years ago that confirmed its location.  She is also on Lo Loestrin for migraines but they have changed recently. She has an upcoming appt with neuro.    BP 120/80   Pulse 77   Ht 5\' 3"  (1.6 m)   Wt 172 lb (78 kg)   LMP  (LMP Unknown) Comment: no menses c iud  BMI 30.47 kg/m   Pelvic exam:  Two IUD strings absent coming from the cervical os. IUD strings not retrievable with IUD hook.   GYN u/s--> IUD in proper position.    Assessment/Plan:   Unable to remove today. RTO with MD for removal/reinsertion.  Rx cytotec (again) and NSAIDs 1 hr before appt.    Complication of intrauterine device (IUD), initial encounter [facility])  Encounter for routine checking of intrauterine contraceptive device (IUD) - Plan: US Transvaginal Non-OB, levonorgestrel (MIRENA) 20 MCG/24HR IUD  Encounter for IUD insertion - RTO for insertion - Plan: misoprostol (CYTOTEC) 100 MCG tablet   She was amenable to this plan.  Alicia B. Copland, PA-C 03/27/2017 11:50 AM

## 2017-04-08 ENCOUNTER — Ambulatory Visit: Payer: No Typology Code available for payment source | Admitting: Obstetrics and Gynecology

## 2017-04-09 ENCOUNTER — Encounter: Payer: Self-pay | Admitting: Physician Assistant

## 2017-04-09 ENCOUNTER — Other Ambulatory Visit: Payer: Self-pay | Admitting: Emergency Medicine

## 2017-04-09 DIAGNOSIS — E039 Hypothyroidism, unspecified: Secondary | ICD-10-CM

## 2017-04-09 MED ORDER — SYNTHROID 175 MCG PO TABS: 175.0000 ug | ORAL_TABLET | Freq: Every day | ORAL | 1 refills | Status: DC

## 2017-04-16 ENCOUNTER — Ambulatory Visit (INDEPENDENT_AMBULATORY_CARE_PROVIDER_SITE_OTHER): Payer: No Typology Code available for payment source | Admitting: Neurology

## 2017-04-16 ENCOUNTER — Telehealth: Payer: Self-pay | Admitting: Neurology

## 2017-04-16 ENCOUNTER — Encounter: Payer: Self-pay | Admitting: Neurology

## 2017-04-16 VITALS — BP 114/75 | HR 73 | Resp 20 | Ht 63.0 in | Wt 171.0 lb

## 2017-04-16 DIAGNOSIS — G43709 Chronic migraine without aura, not intractable, without status migrainosus: Secondary | ICD-10-CM

## 2017-04-16 MED ORDER — NORTRIPTYLINE HCL 10 MG PO CAPS: 10.0000 mg | ORAL_CAPSULE | Freq: Every day | ORAL | 6 refills | Status: DC

## 2017-04-16 MED ORDER — SUMATRIPTAN SUCCINATE 100 MG PO TABS: 100.0000 mg | ORAL_TABLET | Freq: Once | ORAL | 12 refills | Status: DC | PRN

## 2017-04-16 NOTE — Telephone Encounter (Signed)
Stop the imitrex, start the Nortriptline tonight if she feels better. thanks

## 2017-04-16 NOTE — Telephone Encounter (Signed)
Returned pt TC. She reports feeling better than earlier. Let her know again not to take Imitrex anymore. But can start nortriptyline tonight. Verbalized understanding and appreciation for call.

## 2017-04-16 NOTE — Patient Instructions (Addendum)
Remember to drink plenty of fluid, eat healthy meals and do not skip any meals. Try to eat protein with a every meal and eat a healthy snack such as fruit or nuts in between meals. Try to keep a regular sleep-wake schedule and try to exercise daily, particularly in the form of walking, 20-30 minutes a day, if you can.   As far as your medications are concerned, I would like to suggest:  Nortriptyline 10mg  at bedtime Imitrex: Please take one tablet at the onset of your headache. If it does not improve the symptoms please take one additional tablet. Do not take more then 2 tablets in 24hrs. Do not take use more then 2 to 3 times in a week.  I would like to see you back in 3 months, sooner if we need to. Please call us with any interim questions, concerns, problems, updates or refill requests.   Our phone number is [phone]. We also have an after hours call service for urgent matters and there is a physician on-call for urgent questions. For any emergencies you know to call 911 or go to the nearest emergency room  Sumatriptan tablets What is this medicine? SUMATRIPTAN (soo ma TRIP tan) is used to treat migraines with or without aura. An aura is a strange feeling or visual disturbance that warns you of an attack. It is not used to prevent migraines. This medicine may be used for other purposes; ask your health care provider or pharmacist if you have questions. COMMON BRAND NAME(S): Imitrex, Migraine Pack What should I tell my health care provider before I take this medicine? They need to know if you have any of these conditions: -circulation problems in fingers and toes -diabetes -heart disease -high blood pressure -high cholesterol -history of irregular heartbeat -history of stroke -kidney disease -liver disease -postmenopausal or surgical removal of uterus and ovaries -seizures -smoke tobacco -stomach or intestine problems -an unusual or allergic reaction to sumatriptan, other  medicines, foods, dyes, or preservatives -pregnant or trying to get pregnant -breast-feeding How should I use this medicine? Take this medicine by mouth with a glass of water. Follow the directions on the prescription label. This medicine is taken at the first symptoms of a migraine. It is not for everyday use. If your migraine headache returns after one dose, you can take another dose as directed. You must leave at least 2 hours between doses, and do not take more than 100 mg as a single dose. Do not take more than 200 mg total in any 24 hour period. If there is no improvement at all after the first dose, do not take a second dose without talking to your doctor or health care professional. Do not take your medicine more often than directed. Talk to your pediatrician regarding the use of this medicine in children. Special care may be needed. Overdosage: If you think you have taken too much of this medicine contact a poison control center or emergency room at once. NOTE: This medicine is only for you. Do not share this medicine with others. What if I miss a dose? This does not apply; this medicine is not for regular use. What may interact with this medicine? Do not take this medicine with any of the following medicines: -cocaine -ergot alkaloids like dihydroergotamine, ergonovine, ergotamine, methylergonovine -feverfew -MAOIs like Carbex, Eldepryl, Marplan, Nardil, and Parnate -other medicines for migraine headache like almotriptan, eletriptan, frovatriptan, naratriptan, rizatriptan, zolmitriptan -tryptophan This medicine may also interact with the following medications: -certain  medicines for depression, anxiety, or psychotic disturbances This list may not describe all possible interactions. Give your health care provider a list of all the medicines, herbs, non-prescription drugs, or dietary supplements you use. Also tell them if you smoke, drink alcohol, or use illegal drugs. Some items may  interact with your medicine. What should I watch for while using this medicine? Only take this medicine for a migraine headache. Take it if you get warning symptoms or at the start of a migraine attack. It is not for regular use to prevent migraine attacks. You may get drowsy or dizzy. Do not drive, use machinery, or do anything that needs mental alertness until you know how this medicine affects you. To reduce dizzy or fainting spells, do not sit or stand up quickly, especially if you are an older patient. Alcohol can increase drowsiness, dizziness and flushing. Avoid alcoholic drinks. Smoking cigarettes may increase the risk of heart-related side effects from using this medicine. If you take migraine medicines for 10 or more days a month, your migraines may get worse. Keep a diary of headache days and medicine use. Contact your healthcare professional if your migraine attacks occur more frequently. What side effects may I notice from receiving this medicine? Side effects that you should report to your doctor or health care professional as soon as possible: -allergic reactions like skin rash, itching or hives, swelling of the face, lips, or tongue -bloody or watery diarrhea -hallucination, loss of contact with reality -pain, tingling, numbness in the face, hands, or feet -seizures -signs and symptoms of a blood clot such as breathing problems; changes in vision; chest pain; severe, sudden headache; pain, swelling, warmth in the leg; trouble speaking; sudden numbness or weakness of the face, arm, or leg -signs and symptoms of a dangerous change in heartbeat or heart rhythm like chest pain; dizziness; fast or irregular heartbeat; palpitations, feeling faint or lightheaded; falls; breathing problems -signs and symptoms of a stroke like changes in vision; confusion; trouble speaking or understanding; severe headaches; sudden numbness or weakness of the face, arm, or leg; trouble walking; dizziness; loss  of balance or coordination -stomach pain Side effects that usually do not require medical attention (report to your doctor or health care professional if they continue or are bothersome): -changes in taste -facial flushing -headache -muscle cramps -muscle pain -nausea, vomiting -weak or tired This list may not describe all possible side effects. Call your doctor for medical advice about side effects. You may report side effects to FDA at 1-800-FDA-1088. Where should I keep my medicine? Keep out of the reach of children. Store at room temperature between 2 and 30 degrees C (36 and 86 degrees F). Throw away any unused medicine after the expiration date. NOTE: This sheet is a summary. It may not cover all possible information. If you have questions about this medicine, talk to your doctor, pharmacist, or health care provider.  2018 Elsevier/Gold Standard (2016-01-18 12:38:23)   Nortriptyline capsules What is this medicine? NORTRIPTYLINE (nor TRIP ti leen) is used to treat depression. This medicine may be used for other purposes; ask your health care provider or pharmacist if you have questions. COMMON BRAND NAME(S): Aventyl, Pamelor What should I tell my health care provider before I take this medicine? They need to know if you have any of these conditions: -an alcohol problem -bipolar disorder or schizophrenia -difficulty passing urine, prostate trouble -glaucoma -heart disease or recent heart attack -liver disease -over active thyroid -seizures -thoughts or plans of  suicide or a previous suicide attempt or family history of suicide attempt -an unusual or allergic reaction to nortriptyline, other medicines, foods, dyes, or preservatives -pregnant or trying to get pregnant -breast-feeding How should I use this medicine? Take this medicine by mouth with a glass of water. Follow the directions on the prescription label. Take your doses at regular intervals. Do not take it more often  than directed. Do not stop taking this medicine suddenly except upon the advice of your doctor. Stopping this medicine too quickly may cause serious side effects or your condition may worsen. A special MedGuide will be given to you by the pharmacist with each prescription and refill. Be sure to read this information carefully each time. Talk to your pediatrician regarding the use of this medicine in children. Special care may be needed. Overdosage: If you think you have taken too much of this medicine contact a poison control center or emergency room at once. NOTE: This medicine is only for you. Do not share this medicine with others. What if I miss a dose? If you miss a dose, take it as soon as you can. If it is almost time for your next dose, take only that dose. Do not take double or extra doses. What may interact with this medicine? Do not take this medicine with any of the following medications: -arsenic trioxide -certain medicines medicines for irregular heart beat -cisapride -halofantrine -linezolid -MAOIs like Carbex, Eldepryl, Marplan, Nardil, and Parnate -methylene blue (injected into a vein) -other medicines for mental depression -phenothiazines like perphenazine, thioridazine and chlorpromazine -pimozide -probucol -procarbazine -sparfloxacin -St. John's Wort -ziprasidone This medicine may also interact with any of the following medications: -atropine and related drugs like hyoscyamine, scopolamine, tolterodine and others -barbiturate medicines for inducing sleep or treating seizures, such as phenobarbital -cimetidine -medicines for diabetes -medicines for seizures like carbamazepine or phenytoin -reserpine -thyroid medicine This list may not describe all possible interactions. Give your health care provider a list of all the medicines, herbs, non-prescription drugs, or dietary supplements you use. Also tell them if you smoke, drink alcohol, or use illegal drugs. Some items  may interact with your medicine. What should I watch for while using this medicine? Tell your doctor if your symptoms do not get better or if they get worse. Visit your doctor or health care professional for regular checks on your progress. Because it may take several weeks to see the full effects of this medicine, it is important to continue your treatment as prescribed by your doctor. Patients and their families should watch out for new or worsening thoughts of suicide or depression. Also watch out for sudden changes in feelings such as feeling anxious, agitated, panicky, irritable, hostile, aggressive, impulsive, severely restless, overly excited and hyperactive, or not being able to sleep. If this happens, especially at the beginning of treatment or after a change in dose, call your health care professional. Dennis Bast may get drowsy or dizzy. Do not drive, use machinery, or do anything that needs mental alertness until you know how this medicine affects you. Do not stand or sit up quickly, especially if you are an older patient. This reduces the risk of dizzy or fainting spells. Alcohol may interfere with the effect of this medicine. Avoid alcoholic drinks. Do not treat yourself for coughs, colds, or allergies without asking your doctor or health care professional for advice. Some ingredients can increase possible side effects. Your mouth may get dry. Chewing sugarless gum or sucking hard candy, and drinking  plenty of water may help. Contact your doctor if the problem does not go away or is severe. This medicine may cause dry eyes and blurred vision. If you wear contact lenses you may feel some discomfort. Lubricating drops may help. See your eye doctor if the problem does not go away or is severe. This medicine can cause constipation. Try to have a bowel movement at least every 2 to 3 days. If you do not have a bowel movement for 3 days, call your doctor or health care professional. This medicine can make  you more sensitive to the sun. Keep out of the sun. If you cannot avoid being in the sun, wear protective clothing and use sunscreen. Do not use sun lamps or tanning beds/booths. What side effects may I notice from receiving this medicine? Side effects that you should report to your doctor or health care professional as soon as possible: -allergic reactions like skin rash, itching or hives, swelling of the face, lips, or tongue -anxious -breathing problems -changes in vision -confusion -elevated mood, decreased need for sleep, racing thoughts, impulsive behavior -eye pain -fast, irregular heartbeat -feeling faint or lightheaded, falls -feeling agitated, angry, or irritable -fever with increased sweating -hallucination, loss of contact with reality -seizures -stiff muscles -suicidal thoughts or other mood changes -tingling, pain, or numbness in the feet or hands -trouble passing urine or change in the amount of urine -trouble sleeping -unusually weak or tired -vomiting -yellowing of the eyes or skin Side effects that usually do not require medical attention (report to your doctor or health care professional if they continue or are bothersome): -change in sex drive or performance    -change in appetite or weight -constipation -dizziness -dry mouth -nausea -tired -tremors -upset stomach This list may not describe all possible side effects. Call your doctor for medical advice about side effects. You may report side effects to FDA at 1-800-FDA-1088. Where should I keep my medicine? Keep out of the reach of children. Store at room temperature between 15 and 30 degrees C (59 and 86 degrees F). Keep container tightly closed. Throw away any unused medicine after the expiration date. NOTE: This sheet is a summary. It may not cover all possible information. If you have questions about this medicine, talk to your doctor, pharmacist, or health care provider.  2018 Elsevier/Gold Standard  (2016-05-17 12:53:08)   Idiopathic Intracranial Hypertension Idiopathic intracranial hypertension (IIH) is a condition that increases pressure around the brain. The fluid that surrounds the brain and spinal cord (cerebrospinal fluid, CSF) increases and causes the pressure. Idiopathic means that the cause of this condition is not known. IIH affects the brain and spinal cord (is a neurological disorder). If this condition is not treated, it can cause vision loss or blindness. What increases the risk? You are more likely to develop this condition if:  You are severely overweight (obese).  You are a woman who has not gone through menopause.  You take certain medicines, such as birth control or steroids. What are the signs or symptoms? Symptoms of IIH include:  Headaches. This is the most common symptom.  Pain in the shoulders or neck.  Nausea and vomiting.  A "rushing water" or pulsing sound within the ears (pulsatile tinnitus).  Double vision.  Blurred vision.  Brief episodes of complete vision loss. How is this diagnosed? This condition may be diagnosed based on:  Your symptoms.  Your medical history.  CT scan of the brain.  MRI of the brain.  Magnetic  resonance venogram (MRV) to check veins in the brain.  Diagnostic lumbar puncture. This is a procedure to remove and examine a sample of cerebrospinal fluid. This procedure can determine whether too much fluid may be causing IIH.  A thorough eye exam to check for swelling or nerve damage in the eyes. How is this treated? Treatment for this condition depends on your symptoms. The goal of treatment is to decrease the pressure around your brain. Common treatments include:  Medicines to decrease the production of spinal fluid and lower the pressure within your skull.  Medicines to prevent or treat headaches.  Surgery to place drains (shunts) in your brain to remove excess fluid.  Lumbar puncture to remove excess  cerebrospinal fluid. Follow these instructions at home:  If you are overweight or obese, work with your health care provider to lose weight.  Take over-the-counter and prescription medicines only as told by your health care provider.  Do not drive or use heavy machinery while taking medicines that can make you sleepy.  Keep all follow-up visits as told by your health care provider. This is important. Contact a health care provider if:  You have changes in your vision, such as:  Double vision.  Not being able to see colors (color vision). Get help right away if:  You have any of the following symptoms and they get worse or do not get better.  Headaches.  Nausea.  Vomiting.  Vision changes or difficulty seeing. Summary  Idiopathic intracranial hypertension (IIH) is a condition that increases pressure around the brain. The cause is not known (is idiopathic).  The most common symptom of IIH is headaches.  Treatment may include medicines or surgery to relieve the pressure on your brain. This information is not intended to replace advice given to you by your health care provider. Make sure you discuss any questions you have with your health care provider. Document Released: 02/24/2002 Document Revised: 11/06/2016 Document Reviewed: 11/06/2016 Elsevier Interactive Patient Education  2017 [employer].

## 2017-04-16 NOTE — Progress Notes (Signed)
[facility]    Provider:  Dr Jaynee Eagles Referring Provider: Delorse Limber, [place] [facility] Primary Care Physician:  Leeanne Rio, PA-C, [place] [facility]  CC:  Migraines  HPI:  Jacqueline Wilson is a 36 y.o. female here as a referral from Dr. Hassell Done for migraines. She has had migraines since puberty. They are usually 6-7/10 in pain but she can function with the migraines. 5 years ago during pregnancy the migraines stopped. But in march of last year started having daily migraines again. She has pain all day but quality is different. She gets ice pick feeling stabs behind the left eye, it lasts for under 2 minutes. Then she has a migraine afterwards. She has nausea and has had vomiting. The ice pick headaches are twice a month. The migraines are daily behind the left eye, aching, like her eye is bulging, throbbing, light and sound sensitivity. Fluorescent lights make her migraines worse. She is waking up with the headaches. She does not snore, she has lost a lot of weight. Controlling her environment helps.  Napping helps but now the headaches are more persistent and daily. She has not noticed any food triggers. She gets them overnight and in the morning. The migraines can last all day. No ey einjection or lacrimation but she also has sinus swelling. She has not taken daily ibuprofen for 2 weeks but she was taking ibuprofen regularly. No shoulder pain. No daily FHx. Had a bad reaction to Topiramate. No other focal neurologic deficits, associated symptoms, inciting events or modifiable factors.  Tried: Topiramate  Reviewed notes, labs and imaging from outside physicians, which showed  TSH normal 03/05/2017  Ct showed No acute intracranial abnormalities including mass lesion or mass effect, hydrocephalus, extra-axial fluid collection, midline shift, hemorrhage, or acute infarction, large ischemic events (personally reviewed images)     Review of Systems: Patient  complains of symptoms per HPI as well as the following symptoms: Fatigue, blurred vision, headache, dizziness. Pertinent negatives per HPI. All others negative.   Social History   Social History  . Marital status: Married    Spouse name: N/A  . Number of children: 1  . Years of education: N/A   Occupational History  . Marketing    . Therapist    Social History Main Topics  . Smoking status: Never Smoker  . Smokeless tobacco: Never Used  . Alcohol use No  . Drug use: No  . Sexual activity: Not Currently    Birth control/ protection: IUD   Other Topics Concern  . Not on file   Social History Narrative  . No narrative on file    Family History  Problem Relation Age of Onset  . Heart disease Father   . COPD Father     smoker  . Obesity Mother   . Colon cancer Neg Hx   . Esophageal cancer Neg Hx     Past Medical History:  Diagnosis Date  . Internal hemorrhoids   . Migraine   . Pre-eclampsia   . Thyroid disease    Thryoidectomy    Past Surgical History:  Procedure Laterality Date  . CESAREAN SECTION    . HEMORRHOID BANDING    . THYROID SURGERY      Current Outpatient Prescriptions  Medication Sig Dispense Refill  . albuterol (PROVENTIL HFA;VENTOLIN HFA) 108 (90 Base) MCG/ACT inhaler Inhale 2 puffs into the lungs every 6 (six) hours as needed for wheezing or shortness of breath. 1 Inhaler 0  .  albuterol (PROVENTIL) (2.5 MG/3ML) 0.083% nebulizer solution Take 3 mLs (2.5 mg total) by nebulization every 6 (six) hours as needed for wheezing or shortness of breath. 150 mL 1  . EPINEPHrine 0.3 mg/0.3 mL IJ SOAJ injection Inject 0.3 mLs (0.3 mg total) into the muscle once. 1 Device 0  . ibuprofen (ADVIL,MOTRIN) 800 MG tablet Take 800 mg by mouth as needed.    . LO LOESTRIN FE 1 MG-10 MCG / 10 MCG tablet Take 1 tablet by mouth daily.    . montelukast (SINGULAIR) 10 MG tablet TAKE 1 TABLET (10 MG TOTAL) BY MOUTH AT BEDTIME. 90 tablet 0  . SYNTHROID 175 MCG tablet Take  1 tablet (175 mcg total) by mouth daily. 90 tablet 1  . zolpidem (AMBIEN) 10 MG tablet Take 0.5 tablets (5 mg total) by mouth at bedtime as needed for sleep. 15 tablet 2  . levonorgestrel (MIRENA) 20 MCG/24HR IUD 1 Intra Uterine Device (1 each total) by Intrauterine route once. Placed 04/09/12 1 each 0  . nortriptyline (PAMELOR) 10 MG capsule Take 1 capsule (10 mg total) by mouth at bedtime. 30 capsule 6  . SUMAtriptan (IMITREX) 100 MG tablet Take 1 tablet (100 mg total) by mouth once as needed. May repeat in 2 hours if headache persists or recurs. 10 tablet 12   No current facility-administered medications for this visit.     Allergies as of 04/16/2017 - Review Complete 04/16/2017  Allergen Reaction Noted  . Latex Anaphylaxis 02/04/2012  . Rubbing alcohol [alcohol] Anaphylaxis 02/04/2012  . Topamax [topiramate] Other (See Comments) 03/05/2017  . Bean pod extract Swelling 07/08/2016  . Hydrocodone  02/04/2012  . Penicillins  02/04/2012  . Cephalexin Rash 02/04/2012    Vitals: BP 114/75   Pulse 73   Resp 20   Ht 5\' 3"  (1.6 m)   Wt 171 lb (77.6 kg)   BMI 30.29 kg/m  Last Weight:  Wt Readings from Last 1 Encounters:  04/16/17 171 lb (77.6 kg)   Last Height:   Ht Readings from Last 1 Encounters:  04/16/17 5\' 3"  (1.6 m)    Physical exam: Exam: Gen: NAD, conversant, well nourised, obese, well groomed                     CV: RRR, no MRG. No Carotid Bruits. No peripheral edema, warm, nontender Eyes: Conjunctivae clear without exudates or hemorrhage  Neuro: Detailed Neurologic Exam  Speech:    Speech is normal; fluent and spontaneous with normal comprehension.  Cognition:    The patient is oriented to person, place, and time;     recent and remote memory intact;     language fluent;     normal attention, concentration,     fund of knowledge Cranial Nerves:    The pupils are equal, round, and reactive to light. The fundi are normal and spontaneous venous pulsations are  present. Visual fields are full to finger confrontation. Extraocular movements are intact. Trigeminal sensation is intact and the muscles of mastication are normal. The face is symmetric. The palate elevates in the midline. Hearing intact. Voice is normal. Shoulder shrug is normal. The tongue has normal motion without fasciculations.   Coordination:    Normal finger to nose and heel to shin. Normal rapid alternating movements.   Gait:    Heel-toe and tandem gait are normal.   Motor Observation:    No asymmetry, no atrophy, and no involuntary movements noted. Tone:    Normal muscle tone.  Posture:    Posture is normal. normal erect    Strength:    Strength is V/V in the upper and lower limbs.      Sensation: intact to LT     Reflex Exam:  DTR's:    Deep tendon reflexes in the upper and lower extremities are normal bilaterally.   Toes:    The toes are downgoing bilaterally.   Clonus:    Clonus is absent.     Assessment/Plan:  36 year old with chronic migraines without aura  As far as your medications are concerned, I would like to suggest:  Nortriptyline 10mg  at bedtime Imitrex: Please take one tablet at the onset of your headache. If it does not improve the symptoms please take one additional tablet. Do not take more then 2 tablets in 24hrs. Do not take use more then 2 to 3 times in a week. Recommend MRI brain due to change in frequency, quality and severity of migraines, she would like to hold off for now  I would like to see you back in 3 months, sooner if we need to. Please call us with any interim questions, concerns, problems, updates or refill requests.   Discussed: To prevent or relieve headaches, try the following: Cool Compress. Lie down and place a cool compress on your head.  Avoid headache triggers. If certain foods or odors seem to have triggered your migraines in the past, avoid them. A headache diary might help you identify triggers.  Include physical  activity in your daily routine. Try a daily walk or other moderate aerobic exercise.  Manage stress. Find healthy ways to cope with the stressors, such as delegating tasks on your to-do list.  Practice relaxation techniques. Try deep breathing, yoga, massage and visualization.  Eat regularly. Eating regularly scheduled meals and maintaining a healthy diet might help prevent headaches. Also, drink plenty of fluids.  Follow a regular sleep schedule. Sleep deprivation might contribute to headaches Consider biofeedback. With this mind-body technique, you learn to control certain bodily functions - such as muscle tension, heart rate and blood pressure - to prevent headaches or reduce headache pain.    Proceed to emergency room if you experience new or worsening symptoms or symptoms do not resolve, if you have new neurologic symptoms or if headache is severe, or for any concerning symptom.    Sarina Ill, MD  [facility] [address] [place], [place] [address]  Phone [phone] Fax [phone]

## 2017-04-16 NOTE — Telephone Encounter (Signed)
Pt said she is having side effect after taking imitrex tablet at 12:45. She is experiencing increased HA, increased heart rate, floaty and dizzy, nauseated and very weak. Shoulders and neck feel really tight and a lot of pressure. She can hear a "whoosing" sound in her ears.  RN skyped, she was in with NP, advised the pt to stop the medication, drink plenty of clear liquids, she will call her back when finished with NP

## 2017-04-18 ENCOUNTER — Ambulatory Visit (INDEPENDENT_AMBULATORY_CARE_PROVIDER_SITE_OTHER): Payer: No Typology Code available for payment source | Admitting: Obstetrics and Gynecology

## 2017-04-18 VITALS — BP 118/74 | Ht 63.0 in | Wt 170.0 lb

## 2017-04-18 DIAGNOSIS — T8332XD Displacement of intrauterine contraceptive device, subsequent encounter: Secondary | ICD-10-CM

## 2017-04-18 DIAGNOSIS — Z30433 Encounter for removal and reinsertion of intrauterine contraceptive device: Secondary | ICD-10-CM

## 2017-04-18 NOTE — Progress Notes (Signed)
    IUD Removal  Patient identified, informed consent performed, consent signed.  Patient was in the dorsal lithotomy position, normal external genitalia was noted.  A speculum was placed in the patient's vagina, normal discharge was noted, no lesions. The cervix was visualized, no lesions, no abnormal discharge.  The strings of the IUD were not visualized. A single-tooth tenaculum was placed on the anterior lip of the cervix.  Her cervix required dilation.  So, after prep of her cervix with betadine, her cervix was dilated gently in a serial fashion using Hegar dilators to a dilation of 6 mm.  Several passes using a cytobrush and an "IUD hook" were taken.  Finally, the IUD hook was utilized and upon withdrawal the strings of the IUD were visible. They were grasped and pulled using ring forceps. The IUD was removed in its entirety.  Patient tolerated the procedure well.    IUD Insertion Procedure Note Patient identified, informed consent performed, consent signed.   Discussed risks of irregular bleeding, cramping, infection, malpositioning, expulsion or uterine perforation of the IUD (1:1000 placements)  which may require further procedure such as laparoscopy.  IUD while effective at preventing pregnancy do not prevent transmission of sexually transmitted diseases and use of barrier methods for this purpose was discussed. Time out was performed.  Urine pregnancy test not performed due to immediate previous removal of IUD.  Speculum already placed in the vagina.  Cleaned again with Betadine x 2.  Single tooth tenaculum already in place.  Uterus sounded to 7.5 cm. IUD placed per manufacturer's recommendations.  Strings trimmed to 3 cm. Tenaculum was removed, good hemostasis noted.  Patient tolerated procedure well.   Patient was given post-procedure instructions.  She was advised to have backup contraception for one week.  Patient was also asked to check IUD strings periodically and follow up in 4 weeks for  IUD check.  Prentice Docker, MD 04/20/2017 2:41 PM

## 2017-04-20 MED ORDER — LEVONORGESTREL 20 MCG/24HR IU IUD: 1.0000 | INTRAUTERINE_SYSTEM | Freq: Once | INTRAUTERINE | 0 refills | Status: DC

## 2017-05-05 ENCOUNTER — Other Ambulatory Visit: Payer: Self-pay | Admitting: Physician Assistant

## 2017-05-05 DIAGNOSIS — G4700 Insomnia, unspecified: Secondary | ICD-10-CM

## 2017-05-05 NOTE — Telephone Encounter (Signed)
Medication filled to pharmacy as requested.   

## 2017-05-05 NOTE — Telephone Encounter (Signed)
Ambien last filled 09/06/16 #15 2 RF Last ov: 03/05/17

## 2017-05-06 ENCOUNTER — Other Ambulatory Visit: Payer: Self-pay | Admitting: Physician Assistant

## 2017-05-06 DIAGNOSIS — E039 Hypothyroidism, unspecified: Secondary | ICD-10-CM

## 2017-05-24 ENCOUNTER — Other Ambulatory Visit: Payer: Self-pay | Admitting: Obstetrics and Gynecology

## 2017-05-31 ENCOUNTER — Other Ambulatory Visit: Payer: Self-pay | Admitting: Physician Assistant

## 2017-05-31 DIAGNOSIS — J45909 Unspecified asthma, uncomplicated: Secondary | ICD-10-CM

## 2017-06-23 ENCOUNTER — Encounter: Payer: Self-pay | Admitting: Physician Assistant

## 2017-06-23 ENCOUNTER — Ambulatory Visit (INDEPENDENT_AMBULATORY_CARE_PROVIDER_SITE_OTHER): Payer: No Typology Code available for payment source

## 2017-06-23 ENCOUNTER — Ambulatory Visit (INDEPENDENT_AMBULATORY_CARE_PROVIDER_SITE_OTHER): Payer: No Typology Code available for payment source | Admitting: Physician Assistant

## 2017-06-23 VITALS — BP 110/80 | HR 84 | Temp 98.9°F | Ht 63.0 in | Wt 156.0 lb

## 2017-06-23 DIAGNOSIS — M545 Low back pain, unspecified: Secondary | ICD-10-CM

## 2017-06-23 DIAGNOSIS — R319 Hematuria, unspecified: Secondary | ICD-10-CM | POA: Diagnosis not present

## 2017-06-23 LAB — POCT URINALYSIS DIPSTICK
BILIRUBIN UA: NEGATIVE
GLUCOSE UA: NEGATIVE
Ketones, UA: NEGATIVE
Leukocytes, UA: NEGATIVE
NITRITE UA: NEGATIVE
Protein, UA: NEGATIVE
Spec Grav, UA: >=1.03 — AB (ref 1.010–1.025)
UROBILINOGEN UA: 0.2 U/dL
pH, UA: 6 (ref 5.0–8.0)

## 2017-06-23 LAB — POCT URINE PREGNANCY: PREG TEST UR: NEGATIVE

## 2017-06-23 MED ORDER — METHYLPREDNISOLONE 4 MG PO TBPK: ORAL_TABLET | ORAL | 0 refills | Status: DC

## 2017-06-23 NOTE — Progress Notes (Addendum)
Jacqueline Wilson is a 36 y.o. female here for back pain.  I acted as a Education administrator for [employer], PA-C Anselmo Pickler, LPN  History of Present Illness:   Chief Complaint  Patient presents with  . Back Pain    Back Pain  This is a new problem. Episode onset: x 3 days. The problem occurs constantly. The problem has been gradually worsening since onset. The pain is present in the lumbar spine. The quality of the pain is described as aching. The pain does not radiate. The pain is at a severity of 7/10. The pain is worse during the day. The symptoms are aggravated by position, bending and lying down. Stiffness is present all day. Associated symptoms include headaches. Pertinent negatives include no abdominal pain, bladder incontinence, bowel incontinence, chest pain, dysuria, fever, leg pain, numbness, pelvic pain, tingling or weakness. (Had Migraine Saturday night and went away on Sunday. ) She has tried heat, walking and NSAIDs for the symptoms. Improvement on treatment: Little  relief.   When she was pregnant and year or two afterwards, had this same back pain. Had really bad "sciatica." Did go to the chiropractor and said she had two joints that were "too close together" -- I do not have these records to review. Has continued to lose weight. Works out two-three times a week.   Denies prior hx of kidney infection. Has had 2-3 UTI since being married. She does report that she had intercourse last night and had some vaginal spotting afterwards but reports that this is normal for her and her Ob-Gyn is aware. She has an IUD and doesn't get regular periods.   Past Medical History:  Diagnosis Date  . Internal hemorrhoids   . Migraine   . Pre-eclampsia   . Thyroid disease    Thryoidectomy     Social History   Social History  . Marital status: Married    Spouse name: N/A  . Number of children: 1  . Years of education: N/A   Occupational History  . Marketing    . Therapist    Social  History Main Topics  . Smoking status: Never Smoker  . Smokeless tobacco: Never Used  . Alcohol use No  . Drug use: No  . Sexual activity: Not Currently    Birth control/ protection: IUD   Other Topics Concern  . Not on file   Social History Narrative  . No narrative on file    Past Surgical History:  Procedure Laterality Date  . CESAREAN SECTION    . HEMORRHOID BANDING    . THYROID SURGERY      Family History  Problem Relation Age of Onset  . Heart disease Father   . COPD Father        smoker  . Obesity Mother   . Colon cancer Neg Hx   . Esophageal cancer Neg Hx   . Migraines Neg Hx     Allergies  Allergen Reactions  . Latex Anaphylaxis  . Rubbing Alcohol [Alcohol] Anaphylaxis  . Topamax [Topiramate] Other (See Comments)    HI/SI  . Bean Pod Extract Swelling    Butter Bean, Baked Bean: Facial Swelling  . Hydrocodone     Suicidal thoughts with medication  . Penicillins   . Cephalexin Rash    Current Medications:   Current Outpatient Prescriptions:  .  albuterol (PROVENTIL HFA;VENTOLIN HFA) 108 (90 Base) MCG/ACT inhaler, Inhale 2 puffs into the lungs every 6 (six) hours as needed for wheezing or  shortness of breath., Disp: 1 Inhaler, Rfl: 0 .  albuterol (PROVENTIL) (2.5 MG/3ML) 0.083% nebulizer solution, Take 3 mLs (2.5 mg total) by nebulization every 6 (six) hours as needed for wheezing or shortness of breath., Disp: 150 mL, Rfl: 1 .  EPINEPHrine 0.3 mg/0.3 mL IJ SOAJ injection, Inject 0.3 mLs (0.3 mg total) into the muscle once., Disp: 1 Device, Rfl: 0 .  ibuprofen (ADVIL,MOTRIN) 200 MG tablet, Take 400 mg by mouth as needed., Disp: , Rfl:  .  LO LOESTRIN FE 1 MG-10 MCG / 10 MCG tablet, TAKE 1 TABLET BY MOUTH EVERY DAY, Disp: 28 tablet, Rfl: 1 .  montelukast (SINGULAIR) 10 MG tablet, TAKE 1 TABLET (10 MG TOTAL) BY MOUTH AT BEDTIME., Disp: 90 tablet, Rfl: 1 .  nortriptyline (PAMELOR) 10 MG capsule, Take 1 capsule (10 mg total) by mouth at bedtime., Disp: 30  capsule, Rfl: 6 .  SYNTHROID 175 MCG tablet, TAKE 1 TABLET (175 MCG TOTAL) BY MOUTH DAILY., Disp: 30 tablet, Rfl: 5 .  zolpidem (AMBIEN) 10 MG tablet, TAKE 1/2 OF A TABLET BY MOUTH AT BEDTIME AS NEEDED FOR SLEEP, Disp: 15 tablet, Rfl: 0 .  levonorgestrel (MIRENA) 20 MCG/24HR IUD, 1 Intra Uterine Device (1 each total) by Intrauterine route once. Placed 04/09/12, Disp: 1 each, Rfl: 0 .  methylPREDNISolone (MEDROL DOSEPAK) 4 MG TBPK tablet, 6-5-4-3-2-1 off, Disp: 21 tablet, Rfl: 0   Review of Systems:   Review of Systems  Constitutional: Negative for fever.  Cardiovascular: Negative for chest pain.  Gastrointestinal: Negative for abdominal pain and bowel incontinence.  Genitourinary: Negative for bladder incontinence, dysuria and pelvic pain.  Musculoskeletal: Positive for back pain.  Neurological: Positive for headaches. Negative for tingling, weakness and numbness.    Vitals:   Vitals:   06/23/17 1103  BP: 110/80  Pulse: 84  Temp: 98.9 F (37.2 C)  TempSrc: Oral  SpO2: 97%  Weight: 156 lb (70.8 kg)  Height: 5\' 3"  (1.6 m)     Body mass index is 27.63 kg/m.  Physical Exam:   Physical Exam  Constitutional: She appears well-developed. She is cooperative.  Non-toxic appearance. She does not have a sickly appearance. She does not appear ill. No distress.  Cardiovascular: Normal rate, regular rhythm, S1 normal, S2 normal, normal heart sounds and normal pulses.   No LE edema  Pulmonary/Chest: Effort normal and breath sounds normal.  Musculoskeletal:       Lumbar back: She exhibits decreased range of motion, tenderness and bony tenderness.  Decreased ROM with forward flexion secondary to pain, bony tenderness to lower lumbar vertebrae and top of sacrum. Unable to lay down or sit down throughout encounter.  Neurological: She is alert.  Nursing note and vitals reviewed.   EXAM: LUMBAR SPINE - 2-3 VIEW  COMPARISON:  Set her and and coccyx series of November 09, 2013.  FINDINGS: The lumbar vertebral bodies are preserved in height. The twelfth ribs are small. The lumbar disc space heights are well maintained. There is no spondylolisthesis. There is no perched facet. The presacral soft tissues are normal. This sacrum itself exhibits no acute abnormality. An IUD is present.  IMPRESSION: There is no acute or significant chronic bony abnormality of the lumbar spine.  Results for orders placed or performed in visit on 06/23/17  POCT urinalysis dipstick  Result Value Ref Range   Color, UA Yellow    Clarity, UA Clear    Glucose, UA Negative    Bilirubin, UA Negative    Ketones,  UA Negative    Spec Grav, UA >=1.030 (A) 1.010 - 1.025   Blood, UA 80 Ery/uL    pH, UA 6.0 5.0 - 8.0   Protein, UA Negative    Urobilinogen, UA 0.2 0.2 or 1.0 E.U./dL   Nitrite, UA Negative    Leukocytes, UA Negative Negative  POCT urine pregnancy  Result Value Ref Range   Preg Test, Ur Negative Negative    Assessment and Plan:    [place] was seen today for back pain.  Diagnoses and all orders for this visit:  Midline low back pain without sciatica, unspecified chronicity Urine pregnancy negative. Urine dipstick with blood, which is consistent with her report of vaginal spotting after intercourse, will send for micro. X-ray without acute abnormalities. She declined Toradol shot or muscle relaxer. She was agreeable to medrol dose pack. She states that she has gotten insomnia from prednisone in the past and I advised her that if she feels as though her side effects do not outweigh the benefits, then she can stop the medication. I advised for her to follow-up if symptoms worsen or do not improve. -     POCT urinalysis dipstick -     DG Lumbar Spine 2-3 Views; Future -     POCT urine pregnancy  Hematuria, unspecified type Urine dipstick with blood, which is consistent with her report of vaginal spotting after intercourse, will send for micro. -     Urine  Microscopic  Other orders -     methylPREDNISolone (MEDROL DOSEPAK) 4 MG TBPK tablet; 6-5-4-3-2-1 off  . Reviewed expectations re: course of current medical issues. . Discussed self-management of symptoms. . Outlined signs and symptoms indicating need for more acute intervention. . Patient verbalized understanding and all questions were answered. . See orders for this visit as documented in the electronic medical record. . Patient received an After-Visit Summary.  CMA or LPN served as scribe during this visit. History, Physical, and Plan performed by medical provider. Documentation and orders reviewed and attested to.  Inda Coke, PA-C

## 2017-06-23 NOTE — Patient Instructions (Signed)
Please start Medrol Dose pack as prescribed.  Usual Directions for Use Directions for Medrol Dosepak (4 mg tablets): Day 1: Two tablets before breakfast, one after lunch, one after dinner, and two at bedtime. If started late in the day, take all six tablets at once or divide into two or three doses, unless otherwise directed by prescriber. Day 2: One tablet before breakfast, one after lunch, one after dinner, and two at bedtime Day 3: One tablet before breakfast, one after lunch, one after dinner, and one at bedtime Day 4: One tablet before breakfast, one after lunch, and one at bedtime Day 5: One tablet before breakfast and one at bedtime Day 6: One tablet before breakfast     Back Pain, Adult Back pain is very common in adults.The cause of back pain is rarely dangerous and the pain often gets better over time.The cause of your back pain may not be known. Some common causes of back pain include:  Strain of the muscles or ligaments supporting the spine.  Wear and tear (degeneration) of the spinal disks.  Arthritis.  Direct injury to the back.  For many people, back pain may return. Since back pain is rarely dangerous, most people can learn to manage this condition on their own. Follow these instructions at home: Watch your back pain for any changes. The following actions may help to lessen any discomfort you are feeling:  Remain active. It is stressful on your back to sit or stand in one place for long periods of time. Do not sit, drive, or stand in one place for more than 30 minutes at a time. Take short walks on even surfaces as soon as you are able.Try to increase the length of time you walk each day.  Exercise regularly as directed by your health care provider. Exercise helps your back heal faster. It also helps avoid future injury by keeping your muscles strong and flexible.  Do not stay in bed.Resting more than 1-2 days can delay your recovery.  Pay attention to your body  when you bend and lift. The most comfortable positions are those that put less stress on your recovering back. Always use proper lifting techniques, including: ? Bending your knees. ? Keeping the load close to your body. ? Avoiding twisting.  Find a comfortable position to sleep. Use a firm mattress and lie on your side with your knees slightly bent. If you lie on your back, put a pillow under your knees.  Avoid feeling anxious or stressed.Stress increases muscle tension and can worsen back pain.It is important to recognize when you are anxious or stressed and learn ways to manage it, such as with exercise.  Take medicines only as directed by your health care provider. Over-the-counter medicines to reduce pain and inflammation are often the most helpful.Your health care provider may prescribe muscle relaxant drugs.These medicines help dull your pain so you can more quickly return to your normal activities and healthy exercise.  Apply ice to the injured area: ? Put ice in a plastic bag. ? Place a towel between your skin and the bag. ? Leave the ice on for 20 minutes, 2-3 times a day for the first 2-3 days. After that, ice and heat may be alternated to reduce pain and spasms.  Maintain a healthy weight. Excess weight puts extra stress on your back and makes it difficult to maintain good posture.  Contact a health care provider if:  You have pain that is not relieved with rest or  medicine.  You have increasing pain going down into the legs or buttocks.  You have pain that does not improve in one week.  You have night pain.  You lose weight.  You have a fever or chills. Get help right away if:  You develop new bowel or bladder control problems.  You have unusual weakness or numbness in your arms or legs.  You develop nausea or vomiting.  You develop abdominal pain.  You feel faint. This information is not intended to replace advice given to you by your health care provider.  Make sure you discuss any questions you have with your health care provider. Document Released: 12/16/2005 Document Revised: 04/25/2016 Document Reviewed: 04/19/2014 Elsevier Interactive Patient Education  2017 [employer].

## 2017-06-24 LAB — URINALYSIS, MICROSCOPIC ONLY
Bacteria, UA: NONE SEEN [HPF]
CASTS: NONE SEEN [LPF]
CRYSTALS: NONE SEEN [HPF]
Squamous Epithelial / LPF: NONE SEEN [HPF] (ref <=5)
WBC UA: NONE SEEN WBC/HPF (ref <=5)
Yeast: NONE SEEN [HPF]

## 2017-07-17 ENCOUNTER — Other Ambulatory Visit: Payer: Self-pay | Admitting: Obstetrics and Gynecology

## 2017-07-18 ENCOUNTER — Encounter: Payer: Self-pay | Admitting: Obstetrics and Gynecology

## 2017-07-18 ENCOUNTER — Other Ambulatory Visit: Payer: Self-pay

## 2017-07-18 ENCOUNTER — Telehealth: Payer: Self-pay

## 2017-07-18 DIAGNOSIS — Z308 Encounter for other contraceptive management: Secondary | ICD-10-CM

## 2017-07-18 MED ORDER — NORETHIN-ETH ESTRAD-FE BIPHAS 1 MG-10 MCG / 10 MCG PO TABS: 1.0000 | ORAL_TABLET | Freq: Every day | ORAL | 2 refills | Status: DC

## 2017-07-18 NOTE — Telephone Encounter (Signed)
Pt called stating she has her annual scheduled on September 19 w/[facility]. She needs a refill on her Lo Loestrine FE.  Refill sent in, pt aware

## 2017-07-28 ENCOUNTER — Encounter: Payer: Self-pay | Admitting: Physician Assistant

## 2017-07-28 ENCOUNTER — Other Ambulatory Visit: Payer: Self-pay | Admitting: Family Medicine

## 2017-07-28 ENCOUNTER — Ambulatory Visit (INDEPENDENT_AMBULATORY_CARE_PROVIDER_SITE_OTHER): Payer: No Typology Code available for payment source | Admitting: Physician Assistant

## 2017-07-28 VITALS — BP 110/68 | HR 79 | Temp 99.0°F | Resp 16 | Ht 63.0 in | Wt 151.8 lb

## 2017-07-28 DIAGNOSIS — F5101 Primary insomnia: Secondary | ICD-10-CM

## 2017-07-28 DIAGNOSIS — B9689 Other specified bacterial agents as the cause of diseases classified elsewhere: Secondary | ICD-10-CM | POA: Diagnosis not present

## 2017-07-28 DIAGNOSIS — J029 Acute pharyngitis, unspecified: Secondary | ICD-10-CM

## 2017-07-28 DIAGNOSIS — J208 Acute bronchitis due to other specified organisms: Secondary | ICD-10-CM

## 2017-07-28 DIAGNOSIS — G4700 Insomnia, unspecified: Secondary | ICD-10-CM

## 2017-07-28 LAB — POCT RAPID STREP A (OFFICE): RAPID STREP A SCREEN: NEGATIVE

## 2017-07-28 MED ORDER — ZOLPIDEM TARTRATE 10 MG PO TABS: ORAL_TABLET | ORAL | 2 refills | Status: DC

## 2017-07-28 MED ORDER — DOXYCYCLINE HYCLATE 100 MG PO CAPS: 100.0000 mg | ORAL_CAPSULE | Freq: Two times a day (BID) | ORAL | 0 refills | Status: DC

## 2017-07-28 NOTE — Patient Instructions (Signed)
Take antibiotic (Doxycycline) as directed.  Increase fluids.  Get plenty of rest. Use Mucinex for congestion. Delsym for cough. Continue albuterol as directed. Take a daily probiotic (I recommend Align or Culturelle, but even Activia Yogurt may be beneficial).  A humidifier placed in the bedroom may offer some relief for a dry, scratchy throat of nasal irritation.  Read information below on acute bronchitis. Please call or return to clinic if symptoms are not improving.  Acute Bronchitis Bronchitis is when the airways that extend from the windpipe into the lungs get red, puffy, and painful (inflamed). Bronchitis often causes thick spit (mucus) to develop. This leads to a cough. A cough is the most common symptom of bronchitis. In acute bronchitis, the condition usually begins suddenly and goes away over time (usually in 2 weeks). Smoking, allergies, and asthma can make bronchitis worse. Repeated episodes of bronchitis may cause more lung problems.  HOME CARE  Rest.  Drink enough fluids to keep your pee (urine) clear or pale yellow (unless you need to limit fluids as told by your doctor).  Only take over-the-counter or prescription medicines as told by your doctor.  Avoid smoking and secondhand smoke. These can make bronchitis worse. If you are a smoker, think about using nicotine gum or skin patches. Quitting smoking will help your lungs heal faster.  Reduce the chance of getting bronchitis again by:  Washing your hands often.  Avoiding people with cold symptoms.  Trying not to touch your hands to your mouth, nose, or eyes.  Follow up with your doctor as told.  GET HELP IF: Your symptoms do not improve after 1 week of treatment. Symptoms include:  Cough.  Fever.  Coughing up thick spit.  Body aches.  Chest congestion.  Chills.  Shortness of breath.  Sore throat.  GET HELP RIGHT AWAY IF:   You have an increased fever.  You have chills.  You have severe shortness of  breath.  You have bloody thick spit (sputum).  You throw up (vomit) often.  You lose too much body fluid (dehydration).  You have a severe headache.  You faint.  MAKE SURE YOU:   Understand these instructions.  Will watch your condition.  Will get help right away if you are not doing well or get worse. Document Released: 06/03/2008 Document Revised: 08/18/2013 Document Reviewed: 06/08/2013 [facility] Patient Information 2015 [place], [place]. This information is not intended to replace advice given to you by your health care provider. Make sure you discuss any questions you have with your health care provider.

## 2017-07-28 NOTE — Telephone Encounter (Signed)
Ambien last rx 05/05/17 #15 Last ov: 03/05/17 per labs to follow up in 6 months No CSC or UDS  Please advise

## 2017-07-28 NOTE — Progress Notes (Signed)
Patient presents to clinic today c/o symptoms of a URI. Patient endorses several days of fatigue followed by cough and worsening chest congestion. Cough was initially dry but is now somewhat productive. Denies ear pain, sinus pain but has noted PND. Is unsure of fever. Patient with history of allergic asthma, noting some chest tightness today. .  Past Medical History:  Diagnosis Date  . Internal hemorrhoids   . Migraine   . Pre-eclampsia   . Thyroid disease    Thryoidectomy    Current Outpatient Prescriptions on File Prior to Visit  Medication Sig Dispense Refill  . albuterol (PROVENTIL HFA;VENTOLIN HFA) 108 (90 Base) MCG/ACT inhaler Inhale 2 puffs into the lungs every 6 (six) hours as needed for wheezing or shortness of breath. 1 Inhaler 0  . albuterol (PROVENTIL) (2.5 MG/3ML) 0.083% nebulizer solution Take 3 mLs (2.5 mg total) by nebulization every 6 (six) hours as needed for wheezing or shortness of breath. 150 mL 1  . EPINEPHrine 0.3 mg/0.3 mL IJ SOAJ injection Inject 0.3 mLs (0.3 mg total) into the muscle once. 1 Device 0  . ibuprofen (ADVIL,MOTRIN) 200 MG tablet Take 400 mg by mouth as needed.    . methylPREDNISolone (MEDROL DOSEPAK) 4 MG TBPK tablet 6-5-4-3-2-1 off 21 tablet 0  . montelukast (SINGULAIR) 10 MG tablet TAKE 1 TABLET (10 MG TOTAL) BY MOUTH AT BEDTIME. 90 tablet 1  . Norethindrone-Ethinyl Estradiol-Fe Biphas (LO LOESTRIN FE) 1 MG-10 MCG / 10 MCG tablet Take 1 tablet by mouth daily. 28 tablet 2  . nortriptyline (PAMELOR) 10 MG capsule Take 1 capsule (10 mg total) by mouth at bedtime. 30 capsule 6  . SYNTHROID 175 MCG tablet TAKE 1 TABLET (175 MCG TOTAL) BY MOUTH DAILY. 30 tablet 5  . zolpidem (AMBIEN) 10 MG tablet TAKE 1/2 OF A TABLET BY MOUTH AT BEDTIME AS NEEDED FOR SLEEP 15 tablet 0  . levonorgestrel (MIRENA) 20 MCG/24HR IUD 1 Intra Uterine Device (1 each total) by Intrauterine route once. Placed 04/09/12 1 each 0   No current facility-administered medications on file  prior to visit.     Allergies  Allergen Reactions  . Latex Anaphylaxis  . Rubbing Alcohol [Alcohol] Anaphylaxis  . Topamax [Topiramate] Other (See Comments)    HI/SI  . Bean Pod Extract Swelling    Butter Bean, Baked Bean: Facial Swelling  . Hydrocodone     Suicidal thoughts with medication  . Penicillins   . Cephalexin Rash    Family History  Problem Relation Age of Onset  . Heart disease Father   . COPD Father        smoker  . Obesity Mother   . Colon cancer Neg Hx   . Esophageal cancer Neg Hx   . Migraines Neg Hx     Social History   Social History  . Marital status: Married    Spouse name: N/A  . Number of children: 1  . Years of education: N/A   Occupational History  . Marketing    . Therapist    Social History Main Topics  . Smoking status: Never Smoker  . Smokeless tobacco: Never Used  . Alcohol use No  . Drug use: No  . Sexual activity: Not Currently    Birth control/ protection: IUD   Other Topics Concern  . None   Social History Narrative  . None   Review of Systems - See HPI.  All other ROS are negative.  BP 110/68 (BP Location: Left Arm, Cuff Size:  Large)   Pulse 79   Temp 99 F (37.2 C) (Oral)   Resp 16   Ht 5\' 3"  (1.6 m)   Wt 151 lb 12.8 oz (68.9 kg)   SpO2 98%   BMI 26.89 kg/m   Physical Exam  Constitutional: She is oriented to person, place, and time and well-developed, well-nourished, and in no distress.  HENT:  Head: Normocephalic and atraumatic.  Right Ear: External ear normal.  Left Ear: External ear normal.  Nose: Nose normal.  Mouth/Throat: Oropharynx is clear and moist. No oropharyngeal exudate.  TM within normal limits.  Eyes: Conjunctivae are normal.  Neck: Neck supple.  Cardiovascular: Normal rate, regular rhythm, normal heart sounds and intact distal pulses.   Pulmonary/Chest: Effort normal and breath sounds normal. No respiratory distress. She has no wheezes. She has no rales. She exhibits no tenderness.    Neurological: She is alert and oriented to person, place, and time.  Skin: Skin is warm and dry. No rash noted.  Psychiatric: Affect normal.  Vitals reviewed.  Recent Results (from the past 2160 hour(s))  POCT urine pregnancy     Status: None   Collection Time: 06/23/17 11:37 AM  Result Value Ref Range   Preg Test, Ur Negative Negative  POCT urinalysis dipstick     Status: Abnormal   Collection Time: 06/23/17 11:38 AM  Result Value Ref Range   Color, UA Yellow    Clarity, UA Clear    Glucose, UA Negative    Bilirubin, UA Negative    Ketones, UA Negative    Spec Grav, UA >=1.030 (A) 1.010 - 1.025   Blood, UA 80 Ery/uL    pH, UA 6.0 5.0 - 8.0   Protein, UA Negative    Urobilinogen, UA 0.2 0.2 or 1.0 E.U./dL   Nitrite, UA Negative    Leukocytes, UA Negative Negative  Urine Microscopic     Status: None   Collection Time: 06/23/17 11:39 AM  Result Value Ref Range   WBC, UA NONE SEEN <=5 WBC/HPF   RBC / HPF 0-2 <=2 RBC/HPF   Squamous Epithelial / LPF NONE SEEN <=5 HPF   Bacteria, UA NONE SEEN NONE SEEN HPF   Crystals NONE SEEN NONE SEEN HPF   Casts NONE SEEN NONE SEEN LPF   Yeast NONE SEEN NONE SEEN HPF    Assessment/Plan: 1. Sore throat Strep negative. Suspected secondary to cough and PND. Supportive measures and OTC medications reviewed.  - POCT rapid strep A  2. Primary insomnia Medication refilled. - zolpidem (AMBIEN) 10 MG tablet; TAKE 1/2 OF A TABLET BY MOUTH AT BEDTIME AS NEEDED FOR SLEEP  Dispense: 15 tablet; Refill: 2  3. Acute bacterial bronchitis Rx Doxycycline.  Increase fluids.  Rest.  Saline nasal spray.  Probiotic.  Mucinex as directed.  Humidifier in bedroom.  Call or return to clinic if symptoms are not improving.  - doxycycline (VIBRAMYCIN) 100 MG capsule; Take 1 capsule (100 mg total) by mouth 2 (two) times daily.  Dispense: 14 capsule; Refill: 0   Leeanne Rio, [place]

## 2017-08-15 ENCOUNTER — Encounter: Payer: Self-pay | Admitting: Emergency Medicine

## 2017-09-17 ENCOUNTER — Encounter: Payer: Self-pay | Admitting: Obstetrics and Gynecology

## 2017-09-17 ENCOUNTER — Ambulatory Visit (INDEPENDENT_AMBULATORY_CARE_PROVIDER_SITE_OTHER): Payer: No Typology Code available for payment source | Admitting: Obstetrics and Gynecology

## 2017-09-17 VITALS — BP 100/70 | HR 85 | Ht 63.0 in | Wt 150.0 lb

## 2017-09-17 DIAGNOSIS — Z124 Encounter for screening for malignant neoplasm of cervix: Secondary | ICD-10-CM | POA: Diagnosis not present

## 2017-09-17 DIAGNOSIS — Z30431 Encounter for routine checking of intrauterine contraceptive device: Secondary | ICD-10-CM

## 2017-09-17 DIAGNOSIS — G43839 Menstrual migraine, intractable, without status migrainosus: Secondary | ICD-10-CM | POA: Diagnosis not present

## 2017-09-17 DIAGNOSIS — Z1151 Encounter for screening for human papillomavirus (HPV): Secondary | ICD-10-CM

## 2017-09-17 DIAGNOSIS — Z308 Encounter for other contraceptive management: Secondary | ICD-10-CM | POA: Diagnosis not present

## 2017-09-17 DIAGNOSIS — Z01419 Encounter for gynecological examination (general) (routine) without abnormal findings: Secondary | ICD-10-CM

## 2017-09-17 MED ORDER — NORETHIN-ETH ESTRAD-FE BIPHAS 1 MG-10 MCG / 10 MCG PO TABS: 1.0000 | ORAL_TABLET | Freq: Every day | ORAL | 12 refills | Status: DC

## 2017-09-17 NOTE — Progress Notes (Signed)
PCP:  Brunetta Jeans, PA-C   Chief Complaint  Patient presents with  . Gynecologic Exam     HPI:      Ms. Jacqueline Wilson is a 36 y.o. G1P1001 who LMP was No LMP recorded. Patient is not currently having periods (Reason: IUD)., presents today for her annual examination.  Her menses are absent due to IUD and OCPs. She has the IUD for long term contraception and was started on continuous dosing Lo Loestrin for migraine headaches without aura and menstrual migraines with aura by Dr. Cristino Martes. She does not have intermenstrual bleeding.  Her headaches changed last yr in that she started having "ice pick" migraines with visual changes. She saw neuro who put her on nortriptyline nightly. He is fine with her on continuous dosing of OCPs. These 2 meds control her migraines very well.   Sex activity: single partner, contraception - IUD. Mirena placed 04/18/17.  Last Pap: November 18, 2014  Results were: no abnormalities /neg HPV DNA  Hx of STDs: none  There is a FH of breast cancer in her MGM, genetic testing not indicated. There is no FH of ovarian cancer. The patient does do self-breast exams.  Tobacco use: The patient denies current or previous tobacco use. Alcohol use: none No drug use.  Exercise: very active  She does get adequate calcium and Vitamin D in her diet.   Past Medical History:  Diagnosis Date  . Internal hemorrhoids   . Migraine    without aura and menstrual migraine with aura; sees neuro  . Pre-eclampsia   . Thyroid disease    Thryoidectomy    Past Surgical History:  Procedure Laterality Date  . CESAREAN SECTION    . HEMORRHOID BANDING    . THYROID SURGERY      Family History  Problem Relation Age of Onset  . Heart disease Father   . COPD Father        smoker  . Cancer Father   . Obesity Mother   . Breast cancer Maternal Grandmother 36  . Colon cancer Neg Hx   . Esophageal cancer Neg Hx   . Migraines Neg Hx   . Ovarian cancer Neg Hx     Social  History   Social History  . Marital status: Married    Spouse name: N/A  . Number of children: 1  . Years of education: N/A   Occupational History  . Marketing    . Therapist    Social History Main Topics  . Smoking status: Never Smoker  . Smokeless tobacco: Never Used  . Alcohol use No  . Drug use: No  . Sexual activity: Not Currently    Birth control/ protection: IUD   Other Topics Concern  . Not on file   Social History Narrative  . No narrative on file    No outpatient prescriptions have been marked as taking for the 09/17/17 encounter (Office Visit) with Copland, Deirdre Evener, PA-C.     ROS:  Review of Systems  Constitutional: Negative for fatigue, fever and unexpected weight change.  Respiratory: Negative for cough, shortness of breath and wheezing.   Cardiovascular: Negative for chest pain, palpitations and leg swelling.  Gastrointestinal: Negative for blood in stool, constipation, diarrhea, nausea and vomiting.  Endocrine: Negative for cold intolerance, heat intolerance and polyuria.  Genitourinary: Negative for dyspareunia, dysuria, flank pain, frequency, genital sores, hematuria, menstrual problem, pelvic pain, urgency, vaginal bleeding, vaginal discharge and vaginal pain.  Musculoskeletal: Negative for back  pain, joint swelling and myalgias.  Skin: Negative for rash.  Neurological: Negative for dizziness, syncope, light-headedness, numbness and headaches.  Hematological: Negative for adenopathy.  Psychiatric/Behavioral: Negative for agitation, confusion, sleep disturbance and suicidal ideas. The patient is not nervous/anxious.      Objective: BP 100/70   Pulse 85   Ht 5\' 3"  (1.6 m)   Wt 150 lb (68 kg)   BMI 26.57 kg/m    Physical Exam  Constitutional: She is oriented to person, place, and time. She appears well-developed and well-nourished.  Genitourinary: Vagina normal and uterus normal. There is no rash or tenderness on the right labia. There is no  rash or tenderness on the left labia. No erythema or tenderness in the vagina. No vaginal discharge found. Right adnexum does not display mass and does not display tenderness. Left adnexum does not display mass and does not display tenderness.  Cervix exhibits visible IUD strings. Cervix does not exhibit motion tenderness or polyp. Uterus is not enlarged or tender.  Neck: Normal range of motion. No thyromegaly present.  Cardiovascular: Normal rate, regular rhythm and normal heart sounds.   No murmur heard. Pulmonary/Chest: Effort normal and breath sounds normal. Right breast exhibits no mass, no nipple discharge, no skin change and no tenderness. Left breast exhibits no mass, no nipple discharge, no skin change and no tenderness.  Abdominal: Soft. There is no tenderness. There is no guarding.  Musculoskeletal: Normal range of motion.  Neurological: She is alert and oriented to person, place, and time. No cranial nerve deficit.  Psychiatric: She has a normal mood and affect. Her behavior is normal.  Vitals reviewed.   Assessment/Plan: Encounter for annual routine gynecological examination  Cervical cancer screening - Plan: IGP, Aptima HPV  Screening for HPV (human papillomavirus) - Plan: IGP, Aptima HPV  Encounter for routine checking of intrauterine contraceptive device (IUD) - IUD in place. Due for rem 4/23.  Intractable menstrual migraine without status migrainosus - Sx controlled with cont dosing OCPs and nortriptyline. Rx RF Lo Loestrin. Neuro ok with pt on OCPs.  - Plan: Norethindrone-Ethinyl Estradiol-Fe Biphas (LO LOESTRIN FE) 1 MG-10 MCG / 10 MCG tablet  Encounter for other contraceptive management  Meds ordered this encounter  Medications  . Norethindrone-Ethinyl Estradiol-Fe Biphas (LO LOESTRIN FE) 1 MG-10 MCG / 10 MCG tablet    Sig: Take 1 tablet by mouth daily. Continuous dosing    Dispense:  28 tablet    Refill:  12             GYN counsel adequate intake of calcium and  vitamin D, diet and exercise     F/U  Return in about 1 year (around 09/17/2018).  Alicia B. Copland, PA-C 09/17/2017 9:41 AM

## 2017-09-24 ENCOUNTER — Other Ambulatory Visit: Payer: Self-pay | Admitting: Physician Assistant

## 2017-09-24 DIAGNOSIS — J45901 Unspecified asthma with (acute) exacerbation: Secondary | ICD-10-CM

## 2017-09-25 LAB — IGP, APTIMA HPV
HPV APTIMA: NEGATIVE
PAP SMEAR COMMENT: 0

## 2017-10-06 ENCOUNTER — Encounter: Payer: Self-pay | Admitting: Physician Assistant

## 2017-10-07 MED ORDER — ALPRAZOLAM 0.25 MG PO TABS: 0.2500 mg | ORAL_TABLET | Freq: Two times a day (BID) | ORAL | 0 refills | Status: DC | PRN

## 2017-11-02 ENCOUNTER — Other Ambulatory Visit: Payer: Self-pay | Admitting: Neurology

## 2017-11-26 ENCOUNTER — Telehealth: Payer: Self-pay | Admitting: *Deleted

## 2017-11-26 MED ORDER — NORTRIPTYLINE HCL 10 MG PO CAPS: 10.0000 mg | ORAL_CAPSULE | Freq: Every day | ORAL | 3 refills | Status: DC

## 2017-11-26 NOTE — Telephone Encounter (Signed)
90 day supply of Nortriptyline requested by pharmacy. Medication reordered with 3 refills per Dr. Jaynee Eagles.

## 2017-12-25 ENCOUNTER — Telehealth: Payer: Self-pay | Admitting: Physician Assistant

## 2017-12-25 DIAGNOSIS — J45909 Unspecified asthma, uncomplicated: Secondary | ICD-10-CM

## 2017-12-25 MED ORDER — MONTELUKAST SODIUM 10 MG PO TABS
10.0000 mg | ORAL_TABLET | Freq: Every day | ORAL | 0 refills | Status: DC
Start: 2017-12-25 — End: 2018-09-14

## 2017-12-25 NOTE — Telephone Encounter (Signed)
Copied from [place] [phone]. Topic: Quick Communication - See Telephone Encounter >> Dec 25, 2017  4:15 PM Vernona Rieger wrote: CRM for notification. See Telephone encounter for:   12/25/17.  Pt said requesting her SINGULAIR 10mg . She said pharmacy sent a request four days ago. Pharmacy is [facility] on [address] She said if she needs to come in she will, but she is OUT and is miserable. Please call her if she needs to sch an appt

## 2018-02-03 ENCOUNTER — Encounter: Payer: Self-pay | Admitting: Physician Assistant

## 2018-02-03 ENCOUNTER — Other Ambulatory Visit: Payer: Self-pay

## 2018-02-03 ENCOUNTER — Ambulatory Visit: Payer: No Typology Code available for payment source | Admitting: Physician Assistant

## 2018-02-03 VITALS — BP 100/70 | HR 84 | Temp 98.7°F | Resp 14 | Ht 63.0 in | Wt 150.0 lb

## 2018-02-03 DIAGNOSIS — E669 Obesity, unspecified: Secondary | ICD-10-CM | POA: Diagnosis not present

## 2018-02-03 DIAGNOSIS — Z6832 Body mass index (BMI) 32.0-32.9, adult: Secondary | ICD-10-CM

## 2018-02-03 DIAGNOSIS — F5101 Primary insomnia: Secondary | ICD-10-CM | POA: Diagnosis not present

## 2018-02-03 DIAGNOSIS — J019 Acute sinusitis, unspecified: Secondary | ICD-10-CM

## 2018-02-03 DIAGNOSIS — E66811 Obesity, class 1: Secondary | ICD-10-CM

## 2018-02-03 DIAGNOSIS — E039 Hypothyroidism, unspecified: Secondary | ICD-10-CM

## 2018-02-03 DIAGNOSIS — B9689 Other specified bacterial agents as the cause of diseases classified elsewhere: Secondary | ICD-10-CM

## 2018-02-03 DIAGNOSIS — T7840XA Allergy, unspecified, initial encounter: Secondary | ICD-10-CM | POA: Diagnosis not present

## 2018-02-03 MED ORDER — DOXYCYCLINE HYCLATE 100 MG PO CAPS: 100.0000 mg | ORAL_CAPSULE | Freq: Two times a day (BID) | ORAL | 0 refills | Status: DC

## 2018-02-03 MED ORDER — DIPHENHYDRAMINE HCL 12.5 MG/5ML PO ELIX
50.0000 mg | ORAL_SOLUTION | Freq: Once | ORAL | Status: AC
  Administered 2018-02-03: 50 mg via ORAL

## 2018-02-03 MED ORDER — SUVOREXANT 10 MG PO TABS: 10.0000 mg | ORAL_TABLET | Freq: Every evening | ORAL | 0 refills | Status: DC | PRN

## 2018-02-03 MED ORDER — FLUTICASONE PROPIONATE 50 MCG/ACT NA SUSP: 2.0000 | Freq: Every day | NASAL | 6 refills | Status: DC

## 2018-02-03 NOTE — Assessment & Plan Note (Signed)
Rx Doxycycline.  Increase fluids.  Rest.  Saline nasal spray.  Probiotic.  Mucinex as directed.  Humidifier in bedroom. Restart Flonase.  Call or return to clinic if symptoms are not improving.

## 2018-02-03 NOTE — Addendum Note (Signed)
Addended by: Leonidas Romberg on: 02/03/2018 12:27 PM   Modules accepted: Orders

## 2018-02-03 NOTE — Assessment & Plan Note (Signed)
Body mass index is 26.57 kg/m. Continue working on diet and exercise. Very impressed with her hard work.

## 2018-02-03 NOTE — Progress Notes (Signed)
After visit today, patient went to the lab for blood draw. Small amount of alcohol applied to forearm mistakenly by lab tech in order to perform venipuncture, after asking the patient about Latex and alcohol allergies which patient did not endorse to tech. Patient watched alcohol being applied before mentioning allergy to tech. Area immediately washed with hot soapy water. Patient has reported history of "anaphylactic" allergy to inhaling fumes from rubbing and drinking alcohol. Denies history of reaction to contact with rubbing alcohol. Patient monitored for 30 minutes. No rash or irritation at site of contact. No dyspnea or tachycardia noted. Vitals remained stable on multiple checks. Patient was given 50 mg Benadryl for good measure to help calm down any potential mild reaction. Tolerated well. Vitals stable on discharge. Emergency precautions reviewed. CMA to fill out [facility].

## 2018-02-03 NOTE — Assessment & Plan Note (Signed)
Discussed options today. Will start trial of Belsomra 10 mg. Rx and voucher given. Patient to let us know how that is working. Continue sleep hygiene practices.

## 2018-02-03 NOTE — Assessment & Plan Note (Signed)
Repeat TSH

## 2018-02-03 NOTE — Patient Instructions (Signed)
Please go to the lab today for blood work.  I will call you with your results. We will alter treatment regimen(s) if indicated by your results.   Start the trial of Belsomra as directed.  Let me know how this is working.    Please take antibiotic as directed.  Increase fluid intake.  Use Saline nasal spray.  Take a daily multivitamin. Restart Flonase.  Place a humidifier in the bedroom.  Please call or return clinic if symptoms are not improving.  Sinusitis Sinusitis is redness, soreness, and swelling (inflammation) of the paranasal sinuses. Paranasal sinuses are air pockets within the bones of your face (beneath the eyes, the middle of the forehead, or above the eyes). In healthy paranasal sinuses, mucus is able to drain out, and air is able to circulate through them by way of your nose. However, when your paranasal sinuses are inflamed, mucus and air can become trapped. This can allow bacteria and other germs to grow and cause infection. Sinusitis can develop quickly and last only a short time (acute) or continue over a long period (chronic). Sinusitis that lasts for more than 12 weeks is considered chronic.  CAUSES  Causes of sinusitis include:  Allergies.  Structural abnormalities, such as displacement of the cartilage that separates your nostrils (deviated septum), which can decrease the air flow through your nose and sinuses and affect sinus drainage.  Functional abnormalities, such as when the small hairs (cilia) that line your sinuses and help remove mucus do not work properly or are not present. SYMPTOMS  Symptoms of acute and chronic sinusitis are the same. The primary symptoms are pain and pressure around the affected sinuses. Other symptoms include:  Upper toothache.  Earache.  Headache.  Bad breath.  Decreased sense of smell and taste.  A cough, which worsens when you are lying flat.  Fatigue.  Fever.  Thick drainage from your nose, which often is green and may  contain pus (purulent).  Swelling and warmth over the affected sinuses. DIAGNOSIS  Your caregiver will perform a physical exam. During the exam, your caregiver may:  Look in your nose for signs of abnormal growths in your nostrils (nasal polyps).  Tap over the affected sinus to check for signs of infection.  View the inside of your sinuses (endoscopy) with a special imaging device with a light attached (endoscope), which is inserted into your sinuses. If your caregiver suspects that you have chronic sinusitis, one or more of the following tests may be recommended:  Allergy tests.  Nasal culture A sample of mucus is taken from your nose and sent to a lab and screened for bacteria.  Nasal cytology A sample of mucus is taken from your nose and examined by your caregiver to determine if your sinusitis is related to an allergy. TREATMENT  Most cases of acute sinusitis are related to a viral infection and will resolve on their own within 10 days. Sometimes medicines are prescribed to help relieve symptoms (pain medicine, decongestants, nasal steroid sprays, or saline sprays).  However, for sinusitis related to a bacterial infection, your caregiver will prescribe antibiotic medicines. These are medicines that will help kill the bacteria causing the infection.  Rarely, sinusitis is caused by a fungal infection. In theses cases, your caregiver will prescribe antifungal medicine. For some cases of chronic sinusitis, surgery is needed. Generally, these are cases in which sinusitis recurs more than 3 times per year, despite other treatments. HOME CARE INSTRUCTIONS   Drink plenty of water. Water  helps thin the mucus so your sinuses can drain more easily.  Use a humidifier.  Inhale steam 3 to 4 times a day (for example, sit in the bathroom with the shower running).  Apply a warm, moist washcloth to your face 3 to 4 times a day, or as directed by your caregiver.  Use saline nasal sprays to help  moisten and clean your sinuses.  Take over-the-counter or prescription medicines for pain, discomfort, or fever only as directed by your caregiver. SEEK IMMEDIATE MEDICAL CARE IF:  You have increasing pain or severe headaches.  You have nausea, vomiting, or drowsiness.  You have swelling around your face.  You have vision problems.  You have a stiff neck.  You have difficulty breathing. MAKE SURE YOU:   Understand these instructions.  Will watch your condition.  Will get help right away if you are not doing well or get worse. Document Released: 12/16/2005 Document Revised: 03/09/2012 Document Reviewed: 12/31/2011 [facility] Patient Information 2014 [place], [place].

## 2018-02-03 NOTE — Progress Notes (Signed)
Patient presents to clinic today c/o sinus pressure, sinus pain, ear pressure and pain. Symptoms have been acutely worsening over the past 4 days. Denies chest congestion but notes a dry cough. Denies fever, chills or aches. Denies recent travel or sick contact.  Has taken OTC cough medication and Afrin to help with symptoms. Is also taking Ibuprofen.   Patient also would like to discuss other options for treatment of Insomnia. Is taking Ambien on PRN basis but stopped due to parasomnias. Is taking medications OTC with no relief. Was previously on Trazodone but could not tolerate due to side effect.   Patient also requesting repeat TSH levels today. Is taking medicaton as directe.d   Past Medical History:  Diagnosis Date  . Internal hemorrhoids   . Migraine    without aura and menstrual migraine with aura; sees neuro  . Pre-eclampsia   . Thyroid disease    Thryoidectomy    Current Outpatient Medications on File Prior to Visit  Medication Sig Dispense Refill  . ALPRAZolam (XANAX) 0.25 MG tablet Take 1 tablet (0.25 mg total) by mouth 2 (two) times daily as needed for anxiety. 20 tablet 0  . EPINEPHrine 0.3 mg/0.3 mL IJ SOAJ injection Inject 0.3 mLs (0.3 mg total) into the muscle once. 1 Device 0  . ibuprofen (ADVIL,MOTRIN) 200 MG tablet Take 400 mg by mouth as needed.    Marland Kitchen levonorgestrel (MIRENA) 20 MCG/24HR IUD 1 Intra Uterine Device (1 each total) by Intrauterine route once. Placed 04/09/12 1 each 0  . montelukast (SINGULAIR) 10 MG tablet Take 1 tablet (10 mg total) by mouth at bedtime. 90 tablet 0  . Norethindrone-Ethinyl Estradiol-Fe Biphas (LO LOESTRIN FE) 1 MG-10 MCG / 10 MCG tablet Take 1 tablet by mouth daily. Continuous dosing 28 tablet 12  . nortriptyline (PAMELOR) 10 MG capsule Take 1 capsule (10 mg total) by mouth at bedtime. 90 capsule 3  . SYNTHROID 175 MCG tablet TAKE 1 TABLET (175 MCG TOTAL) BY MOUTH DAILY. 30 tablet 5  . albuterol (PROVENTIL HFA;VENTOLIN HFA) 108 (90 Base)  MCG/ACT inhaler Inhale 2 puffs into the lungs every 6 (six) hours as needed for wheezing or shortness of breath. (Patient not taking: Reported on 02/03/2018) 1 Inhaler 0  . albuterol (PROVENTIL) (2.5 MG/3ML) 0.083% nebulizer solution USE 1 VIAL VIA NEBULIZER EVERY 6 HOURS AS NEEDED FOR WHEEZING OR SHORTNESS OF BREATH (Patient not taking: Reported on 02/03/2018) 150 mL 0   No current facility-administered medications on file prior to visit.     Allergies  Allergen Reactions  . Latex Anaphylaxis  . Rubbing Alcohol [Alcohol] Anaphylaxis  . Topamax [Topiramate] Other (See Comments)    HI/SI  . Bean Pod Extract Swelling    Butter Bean, Baked Bean: Facial Swelling  . Hydrocodone     Suicidal thoughts with medication  . Penicillins   . Cephalexin Rash    Family History  Problem Relation Age of Onset  . Heart disease Father   . COPD Father        smoker  . Cancer Father   . Obesity Mother   . Breast cancer Maternal Grandmother 56  . Colon cancer Neg Hx   . Esophageal cancer Neg Hx   . Migraines Neg Hx   . Ovarian cancer Neg Hx     Social History   Socioeconomic History  . Marital status: Married    Spouse name: None  . Number of children: 1  . Years of education: None  . Highest  education level: None  Social Needs  . Financial resource strain: None  . Food insecurity - worry: None  . Food insecurity - inability: None  . Transportation needs - medical: None  . Transportation needs - non-medical: None  Occupational History  . Occupation: Pharmacologist   . Occupation: Therapist  Tobacco Use  . Smoking status: Never Smoker  . Smokeless tobacco: Never Used  Substance and Sexual Activity  . Alcohol use: No    Alcohol/week: 0.0 oz  . Drug use: No  . Sexual activity: Not Currently    Birth control/protection: IUD  Other Topics Concern  . None  Social History Narrative  . None   Review of Systems - See HPI.  All other ROS are negative.  BP 100/70   Pulse 84   Temp 98.7 F  (37.1 C) (Oral)   Resp 14   Ht 5\' 3"  (1.6 m)   Wt 150 lb (68 kg)   SpO2 98%   BMI 26.57 kg/m   Physical Exam  Constitutional: She is oriented to person, place, and time and well-developed, well-nourished, and in no distress.  HENT:  Head: Normocephalic and atraumatic.  Eyes: Conjunctivae are normal.  Neck: Neck supple.  Cardiovascular: Normal rate, regular rhythm, normal heart sounds and intact distal pulses.  Pulmonary/Chest: Effort normal and breath sounds normal. No respiratory distress. She has no wheezes. She has no rales. She exhibits no tenderness.  Neurological: She is alert and oriented to person, place, and time.  Skin: Skin is warm and dry. No rash noted.  Psychiatric: Affect normal.  Vitals reviewed.  Assessment/Plan: Acute bacterial sinusitis Rx Doxycycline.  Increase fluids.  Rest.  Saline nasal spray.  Probiotic.  Mucinex as directed.  Humidifier in bedroom. Restart Flonase.  Call or return to clinic if symptoms are not improving.   Hypothyroidism Repeat TSH.  Insomnia Discussed options today. Will start trial of Belsomra 10 mg. Rx and voucher given. Patient to let us know how that is working. Continue sleep hygiene practices.   Class 1 obesity without serious comorbidity with body mass index (BMI) of 32.0 to 32.9 in adult Body mass index is 26.57 kg/m. Continue working on diet and exercise. Very impressed with her hard work.     Leeanne Rio, PA-C

## 2018-02-10 ENCOUNTER — Other Ambulatory Visit: Payer: Self-pay

## 2018-02-10 ENCOUNTER — Encounter: Payer: Self-pay | Admitting: Physician Assistant

## 2018-02-10 ENCOUNTER — Ambulatory Visit: Payer: No Typology Code available for payment source | Admitting: Physician Assistant

## 2018-02-10 ENCOUNTER — Telehealth: Payer: Self-pay | Admitting: Physician Assistant

## 2018-02-10 VITALS — BP 98/60 | HR 74 | Temp 98.2°F | Resp 16 | Ht 63.0 in | Wt 149.0 lb

## 2018-02-10 DIAGNOSIS — J019 Acute sinusitis, unspecified: Secondary | ICD-10-CM | POA: Diagnosis not present

## 2018-02-10 DIAGNOSIS — B9689 Other specified bacterial agents as the cause of diseases classified elsewhere: Secondary | ICD-10-CM

## 2018-02-10 NOTE — Progress Notes (Signed)
Patient presents to clinic today c/o worsening nasal congestion, sinus pressure, sinus pain and facial pain despite treatment for sinusitis with Doxycycline. Denies fever, chills, malaise. Denies SOB. Is using Afrin to help with nasal congestion.   Past Medical History:  Diagnosis Date  . Internal hemorrhoids   . Migraine    without aura and menstrual migraine with aura; sees neuro  . Pre-eclampsia   . Thyroid disease    Thryoidectomy    Current Outpatient Medications on File Prior to Visit  Medication Sig Dispense Refill  . ALPRAZolam (XANAX) 0.25 MG tablet Take 1 tablet (0.25 mg total) by mouth 2 (two) times daily as needed for anxiety. 20 tablet 0  . doxycycline (VIBRAMYCIN) 100 MG capsule Take 1 capsule (100 mg total) by mouth 2 (two) times daily. 20 capsule 0  . EPINEPHrine 0.3 mg/0.3 mL IJ SOAJ injection Inject 0.3 mLs (0.3 mg total) into the muscle once. 1 Device 0  . fluticasone (FLONASE) 50 MCG/ACT nasal spray Place 2 sprays into both nostrils daily. 16 g 6  . ibuprofen (ADVIL,MOTRIN) 200 MG tablet Take 400 mg by mouth as needed.    . montelukast (SINGULAIR) 10 MG tablet Take 1 tablet (10 mg total) by mouth at bedtime. 90 tablet 0  . Norethindrone-Ethinyl Estradiol-Fe Biphas (LO LOESTRIN FE) 1 MG-10 MCG / 10 MCG tablet Take 1 tablet by mouth daily. Continuous dosing 28 tablet 12  . nortriptyline (PAMELOR) 10 MG capsule Take 1 capsule (10 mg total) by mouth at bedtime. 90 capsule 3  . Suvorexant (BELSOMRA) 10 MG TABS Take 10 mg by mouth at bedtime as needed. 10 tablet 0  . SYNTHROID 175 MCG tablet TAKE 1 TABLET (175 MCG TOTAL) BY MOUTH DAILY. 30 tablet 5  . levonorgestrel (MIRENA) 20 MCG/24HR IUD 1 Intra Uterine Device (1 each total) by Intrauterine route once. Placed 04/09/12 1 each 0   No current facility-administered medications on file prior to visit.     Allergies  Allergen Reactions  . Latex Anaphylaxis  . Rubbing Alcohol [Alcohol] Anaphylaxis  . Topamax [Topiramate]  Other (See Comments)    HI/SI  . Bean Pod Extract Swelling    Butter Bean, Baked Bean: Facial Swelling  . Hydrocodone     Suicidal thoughts with medication  . Penicillins   . Cephalexin Rash    Family History  Problem Relation Age of Onset  . Heart disease Father   . COPD Father        smoker  . Cancer Father   . Obesity Mother   . Breast cancer Maternal Grandmother 57  . Colon cancer Neg Hx   . Esophageal cancer Neg Hx   . Migraines Neg Hx   . Ovarian cancer Neg Hx     Social History   Socioeconomic History  . Marital status: Married    Spouse name: None  . Number of children: 1  . Years of education: None  . Highest education level: None  Social Needs  . Financial resource strain: None  . Food insecurity - worry: None  . Food insecurity - inability: None  . Transportation needs - medical: None  . Transportation needs - non-medical: None  Occupational History  . Occupation: Pharmacologist   . Occupation: Therapist  Tobacco Use  . Smoking status: Never Smoker  . Smokeless tobacco: Never Used  Substance and Sexual Activity  . Alcohol use: No    Alcohol/week: 0.0 oz  . Drug use: No  . Sexual activity: Not Currently  Birth control/protection: IUD  Other Topics Concern  . None  Social History Narrative  . None   Review of Systems - See HPI.  All other ROS are negative.  BP 98/60   Pulse 74   Temp 98.2 F (36.8 C) (Oral)   Resp 16   Ht 5\' 3"  (1.6 m)   Wt 149 lb (67.6 kg)   SpO2 99%   BMI 26.39 kg/m   Physical Exam  Constitutional: She is oriented to person, place, and time and well-developed, well-nourished, and in no distress.  HENT:  Head: Normocephalic and atraumatic.  Right Ear: Tympanic membrane normal.  Left Ear: Tympanic membrane normal.  Nose: Right sinus exhibits maxillary sinus tenderness and frontal sinus tenderness. Left sinus exhibits maxillary sinus tenderness and frontal sinus tenderness.  Mouth/Throat: Uvula is midline, oropharynx is  clear and moist and mucous membranes are normal.  Eyes: Conjunctivae are normal.  Neck: Neck supple.  Cardiovascular: Normal rate, regular rhythm, normal heart sounds and intact distal pulses.  Pulmonary/Chest: Effort normal and breath sounds normal. No respiratory distress. She has no wheezes. She has no rales. She exhibits no tenderness.  Neurological: She is alert and oriented to person, place, and time.  Skin: Skin is warm and dry. No rash noted.  Psychiatric: Affect normal.  Vitals reviewed.  Assessment/Plan: 1. Acute bacterial sinusitis Stop Doxycycline Rx Levaquin 500 mg x 7 days.  Increase fluids.  Rest.  Saline nasal spray.  Probiotic.  Mucinex as directed.  Humidifier in bedroom..  Call or return to clinic if symptoms are not improving.    Leeanne Rio, PA-C

## 2018-02-10 NOTE — Telephone Encounter (Signed)
Copied from [place]. Topic: Quick Communication - Rx Refill/Question >> Feb 10, 2018  3:16 PM Arletha Grippe wrote: Medication: levaquin and nasonex    Has the patient contacted their pharmacy? Yes.     (Agent: If no, request that the patient contact the pharmacy for the refill.)   Preferred Pharmacy (with phone number or street name): [facility] [address] - pt says pharm does not have rx's called in.  Pt is asking for meds to be called in - per pt, they were prescribed from todays visit     Agent: Please be advised that RX refills may take up to 3 business days. We ask that you follow-up with your pharmacy.

## 2018-02-10 NOTE — Patient Instructions (Addendum)
Please stop the Doxycycline. Start the Levaquin as directed.   Increase fluid intake.  Use Saline nasal spray.  Take a daily multivitamin. Stop the Afrin. Start Nasonex with the Flonase.  If you can tolerate Claritin-D please take once daily. Place a humidifier in the bedroom.  Please call or return clinic if symptoms are not improving.  Call me and let me know how the Belsomra is working after you get over the cold! We will make sure we do not have to tweak the dose.   Sinusitis Sinusitis is redness, soreness, and swelling (inflammation) of the paranasal sinuses. Paranasal sinuses are air pockets within the bones of your face (beneath the eyes, the middle of the forehead, or above the eyes). In healthy paranasal sinuses, mucus is able to drain out, and air is able to circulate through them by way of your nose. However, when your paranasal sinuses are inflamed, mucus and air can become trapped. This can allow bacteria and other germs to grow and cause infection. Sinusitis can develop quickly and last only a short time (acute) or continue over a long period (chronic). Sinusitis that lasts for more than 12 weeks is considered chronic.  CAUSES  Causes of sinusitis include:  Allergies.  Structural abnormalities, such as displacement of the cartilage that separates your nostrils (deviated septum), which can decrease the air flow through your nose and sinuses and affect sinus drainage.  Functional abnormalities, such as when the small hairs (cilia) that line your sinuses and help remove mucus do not work properly or are not present. SYMPTOMS  Symptoms of acute and chronic sinusitis are the same. The primary symptoms are pain and pressure around the affected sinuses. Other symptoms include:  Upper toothache.  Earache.  Headache.  Bad breath.  Decreased sense of smell and taste.  A cough, which worsens when you are lying flat.  Fatigue.  Fever.  Thick drainage from your nose, which often  is green and may contain pus (purulent).  Swelling and warmth over the affected sinuses. DIAGNOSIS  Your caregiver will perform a physical exam. During the exam, your caregiver may:  Look in your nose for signs of abnormal growths in your nostrils (nasal polyps).  Tap over the affected sinus to check for signs of infection.  View the inside of your sinuses (endoscopy) with a special imaging device with a light attached (endoscope), which is inserted into your sinuses. If your caregiver suspects that you have chronic sinusitis, one or more of the following tests may be recommended:  Allergy tests.  Nasal culture A sample of mucus is taken from your nose and sent to a lab and screened for bacteria.  Nasal cytology A sample of mucus is taken from your nose and examined by your caregiver to determine if your sinusitis is related to an allergy. TREATMENT  Most cases of acute sinusitis are related to a viral infection and will resolve on their own within 10 days. Sometimes medicines are prescribed to help relieve symptoms (pain medicine, decongestants, nasal steroid sprays, or saline sprays).  However, for sinusitis related to a bacterial infection, your caregiver will prescribe antibiotic medicines. These are medicines that will help kill the bacteria causing the infection.  Rarely, sinusitis is caused by a fungal infection. In theses cases, your caregiver will prescribe antifungal medicine. For some cases of chronic sinusitis, surgery is needed. Generally, these are cases in which sinusitis recurs more than 3 times per year, despite other treatments. HOME CARE INSTRUCTIONS   Drink  plenty of water. Water helps thin the mucus so your sinuses can drain more easily.  Use a humidifier.  Inhale steam 3 to 4 times a day (for example, sit in the bathroom with the shower running).  Apply a warm, moist washcloth to your face 3 to 4 times a day, or as directed by your caregiver.  Use saline nasal  sprays to help moisten and clean your sinuses.  Take over-the-counter or prescription medicines for pain, discomfort, or fever only as directed by your caregiver. SEEK IMMEDIATE MEDICAL CARE IF:  You have increasing pain or severe headaches.  You have nausea, vomiting, or drowsiness.  You have swelling around your face.  You have vision problems.  You have a stiff neck.  You have difficulty breathing. MAKE SURE YOU:   Understand these instructions.  Will watch your condition.  Will get help right away if you are not doing well or get worse. Document Released: 12/16/2005 Document Revised: 03/09/2012 Document Reviewed: 12/31/2011 [facility] Patient Information 2014 [place], [place].

## 2018-02-11 MED ORDER — SULFAMETHOXAZOLE-TRIMETHOPRIM 800-160 MG PO TABS: 1.0000 | ORAL_TABLET | Freq: Two times a day (BID) | ORAL | 0 refills | Status: DC

## 2018-02-11 NOTE — Telephone Encounter (Signed)
I am not sure what the issue was.  Nasonex is over the counter.  ABX changed to Bactrim since she is taking Nortriptyline daily and was sent but resent.

## 2018-02-11 NOTE — Telephone Encounter (Signed)
Advised patient that prescription was resent to the pharmacy. Nasonex is over the counter and changed from Levaquin to Bactrim. Patient is agreeable.

## 2018-02-11 NOTE — Telephone Encounter (Signed)
Pt requesting meds she states were prescribed from office visit yesterday,  Levaquin and nasonex. Please advise. Pharmacy: [phone]  [address] [place]

## 2018-02-24 ENCOUNTER — Encounter: Payer: Self-pay | Admitting: Emergency Medicine

## 2018-02-25 ENCOUNTER — Other Ambulatory Visit: Payer: Self-pay | Admitting: Emergency Medicine

## 2018-02-25 DIAGNOSIS — E039 Hypothyroidism, unspecified: Secondary | ICD-10-CM

## 2018-02-25 MED ORDER — SYNTHROID 175 MCG PO TABS: 175.0000 ug | ORAL_TABLET | Freq: Every day | ORAL | 0 refills | Status: DC

## 2018-03-01 ENCOUNTER — Other Ambulatory Visit: Payer: Self-pay | Admitting: Physician Assistant

## 2018-03-01 DIAGNOSIS — E039 Hypothyroidism, unspecified: Secondary | ICD-10-CM

## 2018-05-21 ENCOUNTER — Other Ambulatory Visit: Payer: Self-pay | Admitting: Physician Assistant

## 2018-05-21 ENCOUNTER — Encounter: Payer: Self-pay | Admitting: Emergency Medicine

## 2018-05-21 DIAGNOSIS — E039 Hypothyroidism, unspecified: Secondary | ICD-10-CM

## 2018-05-28 ENCOUNTER — Ambulatory Visit: Payer: No Typology Code available for payment source | Admitting: Physician Assistant

## 2018-05-28 ENCOUNTER — Other Ambulatory Visit: Payer: Self-pay

## 2018-05-28 ENCOUNTER — Encounter: Payer: Self-pay | Admitting: Physician Assistant

## 2018-05-28 VITALS — BP 98/60 | HR 77 | Temp 98.3°F | Resp 14 | Ht 63.0 in | Wt 149.0 lb

## 2018-05-28 DIAGNOSIS — Z299 Encounter for prophylactic measures, unspecified: Secondary | ICD-10-CM | POA: Diagnosis not present

## 2018-05-28 DIAGNOSIS — E039 Hypothyroidism, unspecified: Secondary | ICD-10-CM | POA: Diagnosis not present

## 2018-05-28 DIAGNOSIS — Z0184 Encounter for antibody response examination: Secondary | ICD-10-CM

## 2018-05-28 DIAGNOSIS — F5101 Primary insomnia: Secondary | ICD-10-CM

## 2018-05-28 NOTE — Progress Notes (Signed)
Patient presents to clinic today for follow-up of hypothyroidism and insomnia.   Hypothyroidism -- Patient is currently on a regimen of Synthroid 175 mcg. Endorses taking medications as directed. Notes doing very well. Denies change in energy, mood, bowel habits or skin. Is due for repeat TSH levels.   Insomnia -- Patient with long-standing history of insomnia, better now than in prior years. Is still having 5+ days per month where she cannot fall or remain asleep. Was previously on Belsomra with no improvement. Xanax helped but made her feel very irritable the day after use. Was previously on Ambien 10 mg which worked very well but noted parasomnias with this dose. She notes that recently she took an old tablet and cut it in 1/4 and took this with good success and no negative effects. Would like to discuss Rx.  Past Medical History:  Diagnosis Date  . Internal hemorrhoids   . Migraine    without aura and menstrual migraine with aura; sees neuro  . Pre-eclampsia   . Thyroid disease    Thryoidectomy    Current Outpatient Medications on File Prior to Visit  Medication Sig Dispense Refill  . ALPRAZolam (XANAX) 0.25 MG tablet Take 1 tablet (0.25 mg total) by mouth 2 (two) times daily as needed for anxiety. 20 tablet 0  . EPINEPHrine 0.3 mg/0.3 mL IJ SOAJ injection Inject 0.3 mLs (0.3 mg total) into the muscle once. 1 Device 0  . fluticasone (FLONASE) 50 MCG/ACT nasal spray Place 2 sprays into both nostrils daily. 16 g 6  . ibuprofen (ADVIL,MOTRIN) 200 MG tablet Take 400 mg by mouth as needed.    Marland Kitchen levonorgestrel (MIRENA) 20 MCG/24HR IUD 1 Intra Uterine Device (1 each total) by Intrauterine route once. Placed 04/09/12 1 each 0  . montelukast (SINGULAIR) 10 MG tablet Take 1 tablet (10 mg total) by mouth at bedtime. 90 tablet 0  . Norethindrone-Ethinyl Estradiol-Fe Biphas (LO LOESTRIN FE) 1 MG-10 MCG / 10 MCG tablet Take 1 tablet by mouth daily. Continuous dosing 28 tablet 12  . nortriptyline  (PAMELOR) 10 MG capsule Take 1 capsule (10 mg total) by mouth at bedtime. 90 capsule 3  . SYNTHROID 175 MCG tablet TAKE 1 TABLET BY MOUTH EVERY DAY 30 tablet 0   No current facility-administered medications on file prior to visit.     Allergies  Allergen Reactions  . Latex Anaphylaxis  . Rubbing Alcohol [Alcohol] Anaphylaxis  . Topamax [Topiramate] Other (See Comments)    HI/SI  . Bean Pod Extract Swelling    Butter Bean, Baked Bean: Facial Swelling  . Hydrocodone     Suicidal thoughts with medication  . Penicillins   . Cephalexin Rash    Family History  Problem Relation Age of Onset  . Heart disease Father   . COPD Father        smoker  . Cancer Father   . Obesity Mother   . Breast cancer Maternal Grandmother 42  . Colon cancer Neg Hx   . Esophageal cancer Neg Hx   . Migraines Neg Hx   . Ovarian cancer Neg Hx     Social History   Socioeconomic History  . Marital status: Married    Spouse name: Not on file  . Number of children: 1  . Years of education: Not on file  . Highest education level: Not on file  Occupational History  . Occupation: Pharmacologist   . Occupation: Transport planner  Social Needs  . Financial resource strain: Not  on file  . Food insecurity:    Worry: Not on file    Inability: Not on file  . Transportation needs:    Medical: Not on file    Non-medical: Not on file  Tobacco Use  . Smoking status: Never Smoker  . Smokeless tobacco: Never Used  Substance and Sexual Activity  . Alcohol use: No    Alcohol/week: 0.0 oz  . Drug use: No  . Sexual activity: Not Currently    Birth control/protection: IUD  Lifestyle  . Physical activity:    Days per week: Not on file    Minutes per session: Not on file  . Stress: Not on file  Relationships  . Social connections:    Talks on phone: Not on file    Gets together: Not on file    Attends religious service: Not on file    Active member of club or organization: Not on file    Attends meetings of clubs  or organizations: Not on file    Relationship status: Not on file  Other Topics Concern  . Not on file  Social History Narrative  . Not on file   Review of Systems - See HPI.  All other ROS are negative.  BP 98/60   Pulse 77   Temp 98.3 F (36.8 C) (Oral)   Resp 14   Ht 5\' 3"  (1.6 m)   Wt 149 lb (67.6 kg)   SpO2 99%   BMI 26.39 kg/m   Physical Exam  Constitutional: She appears well-developed and well-nourished.  HENT:  Head: Normocephalic and atraumatic.  Neck: Neck supple. No thyromegaly present.  Cardiovascular: Normal rate, regular rhythm and normal heart sounds.  Pulmonary/Chest: Effort normal and breath sounds normal. No stridor. No respiratory distress. She has no wheezes.  Lymphadenopathy:    She has no cervical adenopathy.  Psychiatric: She has a normal mood and affect.  Vitals reviewed.  Assessment/Plan: Hypothyroidism Repeat TSH levels today. Will alter synthroid dose if indicated by results.   Preventive measure Will check lipids and CMP today.  Insomnia Will send in Rx Ambien 5 mg to take 1/2 tablet (2.5 mg) at night as needed for sleep. Will monitor.     Leeanne Rio, PA-C

## 2018-05-28 NOTE — Patient Instructions (Addendum)
Please go to the lab today for blood work.  I will call you with your results. We will alter treatment regimen(s) if indicated by your results.   Please continue medications as directed.  I have sent in refills.

## 2018-05-29 ENCOUNTER — Other Ambulatory Visit: Payer: Self-pay | Admitting: Physician Assistant

## 2018-05-29 ENCOUNTER — Telehealth: Payer: Self-pay | Admitting: Radiology

## 2018-05-29 ENCOUNTER — Other Ambulatory Visit (INDEPENDENT_AMBULATORY_CARE_PROVIDER_SITE_OTHER): Payer: No Typology Code available for payment source

## 2018-05-29 DIAGNOSIS — Z299 Encounter for prophylactic measures, unspecified: Secondary | ICD-10-CM

## 2018-05-29 DIAGNOSIS — Z3009 Encounter for other general counseling and advice on contraception: Secondary | ICD-10-CM | POA: Insufficient documentation

## 2018-05-29 NOTE — Telephone Encounter (Signed)
Pt came in for labs from outside provider. Orders were not placed correctly. Orders were placed as active and not future. Lab staff at [facility] were unable to release labs correctly. Printed requisition for each lab that was ordered and cut name and dob label and tapped on tube. Pt stated she was allergic to the alocohol wipes used for cleaning the vein. A betadine swap was used to clean pt vein off. Pt stated during blood draw, "Yesterday the lady that took my blood drew two tubes." Advised pt that the labs that were ordered by her provider only required one tube. Pt stated she was allergic to the adhesive on the band-aids. Coband wrap was used to wrap pt after successful blood draw.

## 2018-05-29 NOTE — Assessment & Plan Note (Signed)
Repeat TSH levels today. Will alter synthroid dose if indicated by results.

## 2018-05-29 NOTE — Assessment & Plan Note (Signed)
Will send in Rx Ambien 5 mg to take 1/2 tablet (2.5 mg) at night as needed for sleep. Will monitor.

## 2018-05-29 NOTE — Assessment & Plan Note (Signed)
Will check lipids and CMP today.

## 2018-06-01 LAB — COMPREHENSIVE METABOLIC PANEL
AG Ratio: 1.8 (calc) (ref 1.0–2.5)
ALT: 13 U/L (ref 6–29)
AST: 15 U/L (ref 10–30)
Albumin: 4.2 g/dL (ref 3.6–5.1)
Alkaline phosphatase (APISO): 43 U/L (ref 33–115)
BUN: 21 mg/dL (ref 7–25)
CO2: 20 mmol/L (ref 20–32)
CREATININE: 0.84 mg/dL (ref 0.50–1.10)
Calcium: 9.4 mg/dL (ref 8.6–10.2)
Chloride: 104 mmol/L (ref 98–110)
GLUCOSE: 87 mg/dL (ref 65–99)
Globulin: 2.4 g/dL (calc) (ref 1.9–3.7)
Potassium: 4.9 mmol/L (ref 3.5–5.3)
SODIUM: 138 mmol/L (ref 135–146)
TOTAL PROTEIN: 6.6 g/dL (ref 6.1–8.1)
Total Bilirubin: 0.4 mg/dL (ref 0.2–1.2)

## 2018-06-01 LAB — TSH: TSH: 0.16 mIU/L — ABNORMAL LOW

## 2018-06-01 LAB — MEASLES/MUMPS/RUBELLA IMMUNITY
Mumps IgG: >300 AU/mL
Rubella: 3.58 index
Rubeola IgG: 163 AU/mL

## 2018-06-01 LAB — LIPID PANEL
Cholesterol: 154 mg/dL (ref <200)
HDL: 54 mg/dL (ref >50)
LDL CHOLESTEROL (CALC): 77 mg/dL
NON-HDL CHOLESTEROL (CALC): 100 mg/dL (ref <130)
TRIGLYCERIDES: 126 mg/dL (ref <150)
Total CHOL/HDL Ratio: 2.9 (calc) (ref <5.0)

## 2018-06-02 ENCOUNTER — Ambulatory Visit: Payer: No Typology Code available for payment source | Admitting: Physician Assistant

## 2018-06-02 ENCOUNTER — Other Ambulatory Visit: Payer: Self-pay

## 2018-06-02 ENCOUNTER — Encounter: Payer: Self-pay | Admitting: Physician Assistant

## 2018-06-02 VITALS — BP 92/60 | HR 74 | Temp 97.5°F | Resp 18 | Ht 63.0 in | Wt 151.6 lb

## 2018-06-02 DIAGNOSIS — R42 Dizziness and giddiness: Secondary | ICD-10-CM | POA: Diagnosis not present

## 2018-06-02 DIAGNOSIS — E039 Hypothyroidism, unspecified: Secondary | ICD-10-CM | POA: Diagnosis not present

## 2018-06-02 MED ORDER — LEVOTHYROXINE SODIUM 150 MCG PO TABS: 150.0000 ug | ORAL_TABLET | Freq: Every day | ORAL | 1 refills | Status: DC

## 2018-06-02 NOTE — Patient Instructions (Signed)
Please go to the lab today for blood work.  I will call you with your results. We will alter treatment regimen(s) if indicated by your results.   Please start the new dose of the Synthroid daily. I have sent in a 30-day supply with 1 refill. We will recheck TSH levels in 4-6 weeks. If stable, I will send in a 90-day supply. I am hopeful that getting your thyroid back in a normal level will help with symptoms. We are checking a blood count today though to assess for anemia that could be contributing.   Keep well-hydrated and keep up with your well-balanced diet.  Keep a symptom journal to help [place] see if there are any common variables present.   If you have any severe dizziness, or note any racing heart or shortness of breath, please go to the ER.

## 2018-06-02 NOTE — Progress Notes (Signed)
Patient presents to clinic today c/o dizzy spells occurring over the past 6 weeks. Notes she is having about 2 episodes per week. Episodes lasting about 1 hour. She describes the episodes as motion sickness with nausea. Has had an episode of non-bloody emesis. Some occasional lightheadedness. Denies headaches, photophobia, phonophobia. Denies ear pressure, nasal congestion or sinus pressure. Denies fevers or sweats. Was seen at Urgent Care yesterday with normal assessment, BMP and negative Urine pregnancy test.   Past Medical History:  Diagnosis Date  . Internal hemorrhoids   . Migraine    without aura and menstrual migraine with aura; sees neuro  . Pre-eclampsia   . Thyroid disease    Thryoidectomy    Current Outpatient Medications on File Prior to Visit  Medication Sig Dispense Refill  . EPINEPHrine 0.3 mg/0.3 mL IJ SOAJ injection Inject 0.3 mLs (0.3 mg total) into the muscle once. 1 Device 0  . fluticasone (FLONASE) 50 MCG/ACT nasal spray Place 2 sprays into both nostrils daily. 16 g 6  . ibuprofen (ADVIL,MOTRIN) 200 MG tablet Take 400 mg by mouth as needed.    Marland Kitchen levonorgestrel (MIRENA) 20 MCG/24HR IUD 1 Intra Uterine Device (1 each total) by Intrauterine route once. Placed 04/09/12 1 each 0  . montelukast (SINGULAIR) 10 MG tablet Take 1 tablet (10 mg total) by mouth at bedtime. 90 tablet 0  . Norethindrone-Ethinyl Estradiol-Fe Biphas (LO LOESTRIN FE) 1 MG-10 MCG / 10 MCG tablet Take 1 tablet by mouth daily. Continuous dosing 28 tablet 12  . nortriptyline (PAMELOR) 10 MG capsule Take 1 capsule (10 mg total) by mouth at bedtime. 90 capsule 3  . SYNTHROID 175 MCG tablet TAKE 1 TABLET BY MOUTH EVERY DAY 30 tablet 0  . ALPRAZolam (XANAX) 0.25 MG tablet Take 1 tablet (0.25 mg total) by mouth 2 (two) times daily as needed for anxiety. (Patient not taking: Reported on 06/02/2018) 20 tablet 0   No current facility-administered medications on file prior to visit.     Allergies  Allergen  Reactions  . Latex Anaphylaxis  . Rubbing Alcohol [Alcohol] Anaphylaxis  . Topamax [Topiramate] Other (See Comments)    HI/SI  . Bean Pod Extract Swelling    Butter Bean, Baked Bean: Facial Swelling  . Hydrocodone     Suicidal thoughts with medication  . Penicillins   . Cephalexin Rash    Family History  Problem Relation Age of Onset  . Heart disease Father   . COPD Father        smoker  . Cancer Father   . Obesity Mother   . Breast cancer Maternal Grandmother 71  . Colon cancer Neg Hx   . Esophageal cancer Neg Hx   . Migraines Neg Hx   . Ovarian cancer Neg Hx     Social History   Socioeconomic History  . Marital status: Married    Spouse name: Not on file  . Number of children: 1  . Years of education: Not on file  . Highest education level: Not on file  Occupational History  . Occupation: Pharmacologist   . Occupation: Transport planner  Social Needs  . Financial resource strain: Not on file  . Food insecurity:    Worry: Not on file    Inability: Not on file  . Transportation needs:    Medical: Not on file    Non-medical: Not on file  Tobacco Use  . Smoking status: Never Smoker  . Smokeless tobacco: Never Used  Substance and Sexual Activity  .  Alcohol use: No    Alcohol/week: 0.0 oz  . Drug use: No  . Sexual activity: Not Currently    Birth control/protection: IUD  Lifestyle  . Physical activity:    Days per week: Not on file    Minutes per session: Not on file  . Stress: Not on file  Relationships  . Social connections:    Talks on phone: Not on file    Gets together: Not on file    Attends religious service: Not on file    Active member of club or organization: Not on file    Attends meetings of clubs or organizations: Not on file    Relationship status: Not on file  Other Topics Concern  . Not on file  Social History Narrative  . Not on file   Review of Systems - See HPI.  All other ROS are negative.  BP 92/60   Pulse 74   Temp (!) 97.5 F (36.4  C)   Resp 18   Ht 5\' 3"  (1.6 m)   Wt 151 lb 9.6 oz (68.8 kg)   SpO2 98%   BMI 26.85 kg/m   Physical Exam  Constitutional: She is oriented to person, place, and time. She appears well-developed and well-nourished.  HENT:  Head: Normocephalic and atraumatic.  Right Ear: External ear normal.  Left Ear: External ear normal.  Mouth/Throat: Oropharynx is clear and moist.  Eyes: Pupils are equal, round, and reactive to light. Conjunctivae are normal.  Cardiovascular: Normal rate and regular rhythm.  Pulmonary/Chest: Effort normal and breath sounds normal. No stridor. No respiratory distress. She has no wheezes. She has no rales. She exhibits no tenderness.  Neurological: She is alert and oriented to person, place, and time. No cranial nerve deficit. Coordination normal.  Skin: No rash noted.  Psychiatric: She has a normal mood and affect.  Vitals reviewed.  Recent Results (from the past 2160 hour(s))  Measles/Mumps/Rubella Immunity     Status: None   Collection Time: 05/29/18 11:22 AM  Result Value Ref Range   Rubeola IgG 163.00 AU/mL    Comment: AU/mL            Interpretation -----            -------------- <25.00           Negative 25.00-29.99      Equivocal >29.99           Positive . A positive result indicates that the patient has antibody to measles virus. It does not differentiate  between an active or past infection. The clinical  diagnosis must be interpreted in conjunction with  clinical signs and symptoms of the patient.    Mumps IgG >300.00 AU/mL    Comment:  AU/mL           Interpretation -------         ---------------- <9.00             Negative 9.00-10.99        Equivocal >10.99            Positive A positive result indicates that the patient has  antibody to mumps virus. It does not differentiate between an  active or past infection. The clinical diagnosis must be interpreted in conjunction with clinical signs and symptoms of the patient. .    Rubella  3.58 index    Comment:     Index            Interpretation     -----            --------------       <  0.90            Not consistent with Immunity     0.90-0.99        Equivocal     > or = 1.00      Consistent with Immunity  . The presence of rubella IgG antibody suggests  immunization or past or current infection with rubella virus.   Lipid panel     Status: None   Collection Time: 05/29/18 11:22 AM  Result Value Ref Range   Cholesterol 154 <200 mg/dL   HDL 54 >50 mg/dL   Triglycerides 126 <150 mg/dL   LDL Cholesterol (Calc) 77 mg/dL (calc)    Comment: Reference range: <100 . Desirable range <100 mg/dL for primary prevention;   <70 mg/dL for patients with CHD or diabetic patients  with > or = 2 CHD risk factors. Marland Kitchen LDL-C is now calculated using the Sarahjane Matherly-Hopkins  calculation, which is a validated novel method providing  better accuracy than the Friedewald equation in the  estimation of LDL-C.  Cresenciano Genre et al. Annamaria Helling. 1610;960(45): 2061-2068  (http://education.[facility].com/faq/FAQ164)    Total CHOL/HDL Ratio 2.9 <5.0 (calc)   Non-HDL Cholesterol (Calc) 100 <130 mg/dL (calc)    Comment: For patients with diabetes plus 1 major ASCVD risk  factor, treating to a non-HDL-C goal of <100 mg/dL  (LDL-C of <70 mg/dL) is considered a therapeutic  option.   Comprehensive metabolic panel     Status: None   Collection Time: 05/29/18 11:22 AM  Result Value Ref Range   Glucose, Bld 87 65 - 99 mg/dL    Comment: .            Fasting reference interval .    BUN 21 7 - 25 mg/dL   Creat 0.84 0.50 - 1.10 mg/dL   BUN/Creatinine Ratio NOT APPLICABLE 6 - 22 (calc)   Sodium 138 135 - 146 mmol/L   Potassium 4.9 3.5 - 5.3 mmol/L   Chloride 104 98 - 110 mmol/L   CO2 20 20 - 32 mmol/L   Calcium 9.4 8.6 - 10.2 mg/dL   Total Protein 6.6 6.1 - 8.1 g/dL   Albumin 4.2 3.6 - 5.1 g/dL   Globulin 2.4 1.9 - 3.7 g/dL (calc)   AG Ratio 1.8 1.0 - 2.5 (calc)   Total Bilirubin 0.4 0.2 - 1.2  mg/dL   Alkaline phosphatase (APISO) 43 33 - 115 U/L   AST 15 10 - 30 U/L   ALT 13 6 - 29 U/L  TSH     Status: Abnormal   Collection Time: 05/29/18 11:22 AM  Result Value Ref Range   TSH 0.16 (L) mIU/L    Comment:           Reference Range .           > or = 20 Years  0.40-4.50 .                Pregnancy Ranges           First trimester    0.26-2.66           Second trimester   0.55-2.73           Third trimester    0.43-2.91     Assessment/Plan: 1. Acquired hypothyroidism Recent TSH is low. Concern hyperthyroid state is contributing to current symptoms. Decrease Synthroid from 175 mcg daily to 150 mcg daily. Repeat TSH in 4-6 weeks.   2. Dizziness OVS negative. Exam unremarkable. Negative BMP and  UPT at Urgent care yesterday. Concern that thyroid is contributing (see above plan). Also with low resting BP. Will check CBC w diff today. Patient encouraged to keep a symptom journal. Increase fluids and salt intake. Thyroid medication as directed. Will alter regimen according to results and response.   Leeanne Rio, PA-C

## 2018-06-03 ENCOUNTER — Telehealth: Payer: Self-pay | Admitting: Physician Assistant

## 2018-06-03 LAB — CBC WITH DIFFERENTIAL/PLATELET
BASOS PCT: 0.4 %
Basophils Absolute: 31 cells/uL (ref 0–200)
EOS ABS: 148 {cells}/uL (ref 15–500)
Eosinophils Relative: 1.9 %
HEMATOCRIT: 44.2 % (ref 35.0–45.0)
Hemoglobin: 14.8 g/dL (ref 11.7–15.5)
LYMPHS ABS: 1895 {cells}/uL (ref 850–3900)
MCH: 31 pg (ref 27.0–33.0)
MCHC: 33.5 g/dL (ref 32.0–36.0)
MCV: 92.5 fL (ref 80.0–100.0)
MPV: 10.9 fL (ref 7.5–12.5)
Monocytes Relative: 4.8 %
NEUTROS PCT: 68.6 %
Neutro Abs: 5351 cells/uL (ref 1500–7800)
PLATELETS: 245 10*3/uL (ref 140–400)
RBC: 4.78 10*6/uL (ref 3.80–5.10)
RDW: 12.9 % (ref 11.0–15.0)
TOTAL LYMPHOCYTE: 24.3 %
WBC: 7.8 10*3/uL (ref 3.8–10.8)
WBCMIX: 374 {cells}/uL (ref 200–950)

## 2018-06-03 MED ORDER — ZOLPIDEM TARTRATE 5 MG PO TABS: 2.5000 mg | ORAL_TABLET | Freq: Every evening | ORAL | 1 refills | Status: DC | PRN

## 2018-06-03 NOTE — Telephone Encounter (Signed)
Patient notified of lab results- she did mention that she is awaiting a refill on  Ambien. She is not completely out- but will need it very soon. Told patient I would let provider know.

## 2018-06-03 NOTE — Telephone Encounter (Signed)
Copied from [place] [id number]. Topic: Quick Communication - Lab Results >> Jun 03, 2018 11:38 AM Sigurd Sos, LPN wrote: Called patient to inform them of 06/03/2018 lab results. When patient returns call, triage nurse may disclose results. >> Jun 03, 2018 12:05 PM Oneta Rack wrote: Relation to pt: self Call back number:[phone]   Reason for call:  Patient returning call regarding lab results, please advise

## 2018-06-03 NOTE — Telephone Encounter (Signed)
Rx has been resubmitted.

## 2018-06-12 ENCOUNTER — Other Ambulatory Visit: Payer: Self-pay | Admitting: Physician Assistant

## 2018-06-12 DIAGNOSIS — E039 Hypothyroidism, unspecified: Secondary | ICD-10-CM

## 2018-06-24 ENCOUNTER — Other Ambulatory Visit: Payer: Self-pay | Admitting: Physician Assistant

## 2018-07-15 ENCOUNTER — Other Ambulatory Visit: Payer: Self-pay

## 2018-07-15 ENCOUNTER — Encounter: Payer: Self-pay | Admitting: Physician Assistant

## 2018-07-15 ENCOUNTER — Ambulatory Visit: Payer: No Typology Code available for payment source | Admitting: Physician Assistant

## 2018-07-15 VITALS — BP 96/70 | HR 87 | Temp 97.5°F | Resp 14 | Ht 63.0 in | Wt 156.0 lb

## 2018-07-15 DIAGNOSIS — J019 Acute sinusitis, unspecified: Secondary | ICD-10-CM | POA: Diagnosis not present

## 2018-07-15 DIAGNOSIS — E039 Hypothyroidism, unspecified: Secondary | ICD-10-CM

## 2018-07-15 DIAGNOSIS — B9689 Other specified bacterial agents as the cause of diseases classified elsewhere: Secondary | ICD-10-CM

## 2018-07-15 MED ORDER — DOXYCYCLINE HYCLATE 100 MG PO CAPS: 100.0000 mg | ORAL_CAPSULE | Freq: Two times a day (BID) | ORAL | 0 refills | Status: DC

## 2018-07-15 NOTE — Progress Notes (Signed)
Patient presents to clinic today c/o > 1 week of sinus pressure, sinus pain with nasal congestion. Notes note with some chest congestion with mild cough, worse at night and keeping her from sleep. Endorses ear pressure and pain with decrease hearing. Denies fever, chills, chest pain.. Notes symptoms are worsening gradually. Has taken OTC medications with only some relief of symptoms.   Past Medical History:  Diagnosis Date  . Internal hemorrhoids   . Migraine    without aura and menstrual migraine with aura; sees neuro  . Pre-eclampsia   . Thyroid disease    Thryoidectomy    Current Outpatient Medications on File Prior to Visit  Medication Sig Dispense Refill  . ALPRAZolam (XANAX) 0.25 MG tablet Take 1 tablet (0.25 mg total) by mouth 2 (two) times daily as needed for anxiety. 20 tablet 0  . EPINEPHrine 0.3 mg/0.3 mL IJ SOAJ injection Inject 0.3 mLs (0.3 mg total) into the muscle once. 1 Device 0  . fluticasone (FLONASE) 50 MCG/ACT nasal spray Place 2 sprays into both nostrils daily. 16 g 6  . ibuprofen (ADVIL,MOTRIN) 200 MG tablet Take 400 mg by mouth as needed.    Marland Kitchen levonorgestrel (MIRENA) 20 MCG/24HR IUD 1 Intra Uterine Device (1 each total) by Intrauterine route once. Placed 04/09/12 1 each 0  . montelukast (SINGULAIR) 10 MG tablet Take 1 tablet (10 mg total) by mouth at bedtime. 90 tablet 0  . Norethindrone-Ethinyl Estradiol-Fe Biphas (LO LOESTRIN FE) 1 MG-10 MCG / 10 MCG tablet Take 1 tablet by mouth daily. Continuous dosing 28 tablet 12  . nortriptyline (PAMELOR) 10 MG capsule Take 1 capsule (10 mg total) by mouth at bedtime. 90 capsule 3  . SYNTHROID 150 MCG tablet TAKE 1 TABLET (150 MCG TOTAL) BY MOUTH DAILY BEFORE BREAKFAST. 90 tablet 1  . zolpidem (AMBIEN) 5 MG tablet Take 0.5 tablets (2.5 mg total) by mouth at bedtime as needed for sleep. 15 tablet 1   No current facility-administered medications on file prior to visit.     Allergies  Allergen Reactions  . Latex  Anaphylaxis  . Rubbing Alcohol [Alcohol] Anaphylaxis  . Topamax [Topiramate] Other (See Comments)    HI/SI  . Bean Pod Extract Swelling    Butter Bean, Baked Bean: Facial Swelling  . Hydrocodone     Suicidal thoughts with medication  . Penicillins   . Cephalexin Rash    Family History  Problem Relation Age of Onset  . Heart disease Father   . COPD Father        smoker  . Cancer Father   . Obesity Mother   . Breast cancer Maternal Grandmother 80  . Colon cancer Neg Hx   . Esophageal cancer Neg Hx   . Migraines Neg Hx   . Ovarian cancer Neg Hx     Social History   Socioeconomic History  . Marital status: Married    Spouse name: Not on file  . Number of children: 1  . Years of education: Not on file  . Highest education level: Not on file  Occupational History  . Occupation: Pharmacologist   . Occupation: Transport planner  Social Needs  . Financial resource strain: Not on file  . Food insecurity:    Worry: Not on file    Inability: Not on file  . Transportation needs:    Medical: Not on file    Non-medical: Not on file  Tobacco Use  . Smoking status: Never Smoker  . Smokeless tobacco: Never Used  Substance and Sexual Activity  . Alcohol use: No    Alcohol/week: 0.0 oz  . Drug use: No  . Sexual activity: Not Currently    Birth control/protection: IUD  Lifestyle  . Physical activity:    Days per week: Not on file    Minutes per session: Not on file  . Stress: Not on file  Relationships  . Social connections:    Talks on phone: Not on file    Gets together: Not on file    Attends religious service: Not on file    Active member of club or organization: Not on file    Attends meetings of clubs or organizations: Not on file    Relationship status: Not on file  Other Topics Concern  . Not on file  Social History Narrative  . Not on file    Review of Systems - See HPI.  All other ROS are negative.  BP 96/70   Pulse 87   Temp (!) 97.5 F (36.4 C) (Oral)   Resp  14   Ht 5\' 3"  (1.6 m)   Wt 156 lb (70.8 kg)   SpO2 99%   BMI 27.63 kg/m   Physical Exam  Constitutional: She appears well-developed and well-nourished.  HENT:  Head: Normocephalic and atraumatic.  Right Ear: External ear normal.  Left Ear: External ear normal.  Nose: Mucosal edema and rhinorrhea present. Right sinus exhibits frontal sinus tenderness. Left sinus exhibits frontal sinus tenderness.  Mouth/Throat: Uvula is midline and mucous membranes are normal. No posterior oropharyngeal erythema.  Eyes: Conjunctivae and EOM are normal.  Neck: Neck supple.  Cardiovascular: Normal rate, regular rhythm and normal heart sounds.  Pulmonary/Chest: Effort normal and breath sounds normal.  Lymphadenopathy:    She has no cervical adenopathy.  Psychiatric: She has a normal mood and affect.  Vitals reviewed.   Recent Results (from the past 2160 hour(s))  Measles/Mumps/Rubella Immunity     Status: None   Collection Time: 05/29/18 11:22 AM  Result Value Ref Range   Rubeola IgG 163.00 AU/mL    Comment: AU/mL            Interpretation -----            -------------- <25.00           Negative 25.00-29.99      Equivocal >29.99           Positive . A positive result indicates that the patient has antibody to measles virus. It does not differentiate  between an active or past infection. The clinical  diagnosis must be interpreted in conjunction with  clinical signs and symptoms of the patient.    Mumps IgG >300.00 AU/mL    Comment:  AU/mL           Interpretation -------         ---------------- <9.00             Negative 9.00-10.99        Equivocal >10.99            Positive A positive result indicates that the patient has  antibody to mumps virus. It does not differentiate between an  active or past infection. The clinical diagnosis must be interpreted in conjunction with clinical signs and symptoms of the patient. .    Rubella 3.58 index    Comment:     Index             Interpretation     -----            --------------       <  0.90            Not consistent with Immunity     0.90-0.99        Equivocal     > or = 1.00      Consistent with Immunity  . The presence of rubella IgG antibody suggests  immunization or past or current infection with rubella virus.   Lipid panel     Status: None   Collection Time: 05/29/18 11:22 AM  Result Value Ref Range   Cholesterol 154 <200 mg/dL   HDL 54 >50 mg/dL   Triglycerides 126 <150 mg/dL   LDL Cholesterol (Calc) 77 mg/dL (calc)    Comment: Reference range: <100 . Desirable range <100 mg/dL for primary prevention;   <70 mg/dL for patients with CHD or diabetic patients  with > or = 2 CHD risk factors. Marland Kitchen LDL-C is now calculated using the Camrie Stock-Hopkins  calculation, which is a validated novel method providing  better accuracy than the Friedewald equation in the  estimation of LDL-C.  Cresenciano Genre et al. Annamaria Helling. 8341;962(22): 2061-2068  (http://education.[facility].com/faq/FAQ164)    Total CHOL/HDL Ratio 2.9 <5.0 (calc)   Non-HDL Cholesterol (Calc) 100 <130 mg/dL (calc)    Comment: For patients with diabetes plus 1 major ASCVD risk  factor, treating to a non-HDL-C goal of <100 mg/dL  (LDL-C of <70 mg/dL) is considered a therapeutic  option.   Comprehensive metabolic panel     Status: None   Collection Time: 05/29/18 11:22 AM  Result Value Ref Range   Glucose, Bld 87 65 - 99 mg/dL    Comment: .            Fasting reference interval .    BUN 21 7 - 25 mg/dL   Creat 0.84 0.50 - 1.10 mg/dL   BUN/Creatinine Ratio NOT APPLICABLE 6 - 22 (calc)   Sodium 138 135 - 146 mmol/L   Potassium 4.9 3.5 - 5.3 mmol/L   Chloride 104 98 - 110 mmol/L   CO2 20 20 - 32 mmol/L   Calcium 9.4 8.6 - 10.2 mg/dL   Total Protein 6.6 6.1 - 8.1 g/dL   Albumin 4.2 3.6 - 5.1 g/dL   Globulin 2.4 1.9 - 3.7 g/dL (calc)   AG Ratio 1.8 1.0 - 2.5 (calc)   Total Bilirubin 0.4 0.2 - 1.2 mg/dL   Alkaline phosphatase (APISO) 43 33 -  115 U/L   AST 15 10 - 30 U/L   ALT 13 6 - 29 U/L  TSH     Status: Abnormal   Collection Time: 05/29/18 11:22 AM  Result Value Ref Range   TSH 0.16 (L) mIU/L    Comment:           Reference Range .           > or = 20 Years  0.40-4.50 .                Pregnancy Ranges           First trimester    0.26-2.66           Second trimester   0.55-2.73           Third trimester    0.43-2.91   CBC w/Diff     Status: None   Collection Time: 06/02/18  9:23 AM  Result Value Ref Range   WBC 7.8 3.8 - 10.8 Thousand/uL   RBC 4.78 3.80 - 5.10 Million/uL   Hemoglobin 14.8 11.7 - 15.5  g/dL   HCT 44.2 35.0 - 45.0 %   MCV 92.5 80.0 - 100.0 fL   MCH 31.0 27.0 - 33.0 pg   MCHC 33.5 32.0 - 36.0 g/dL   RDW 12.9 11.0 - 15.0 %   Platelets 245 140 - 400 Thousand/uL   MPV 10.9 7.5 - 12.5 fL   Neutro Abs 5,351 1,500 - 7,800 cells/uL   Lymphs Abs 1,895 850 - 3,900 cells/uL   WBC mixed population 374 200 - 950 cells/uL   Eosinophils Absolute 148 15 - 500 cells/uL   Basophils Absolute 31 0 - 200 cells/uL   Neutrophils Relative % 68.6 %   Total Lymphocyte 24.3 %   Monocytes Relative 4.8 %   Eosinophils Relative 1.9 %   Basophils Relative 0.4 %    Assessment/Plan: 1. Acute bacterial sinusitis Rx doxycycline.  Increase fluids.  Rest.  Saline nasal spray.  Probiotic.  Delsym for cough. Continue allergy medications. Humidifier in bedroom.  Call or return to clinic if symptoms are not improving.  - doxycycline (VIBRAMYCIN) 100 MG capsule; Take 1 capsule (100 mg total) by mouth 2 (two) times daily.  Dispense: 20 capsule; Refill: 0  2. Hypothyroidism, unspecified type Due for repeat TSH. Orders placed. - TSH   Leeanne Rio, PA-C

## 2018-07-15 NOTE — Patient Instructions (Addendum)
Please take antibiotic as directed.  Increase fluid intake.  Use Saline nasal spray.  Take a daily multivitamin. Start Delsym over-the-counter for cough.  We are very limited in Rx cough medicines due to allergies and that you do not find the Tessalon effective. Place a humidifier in the bedroom.  Please call or return clinic if symptoms are not improving.  Sinusitis Sinusitis is redness, soreness, and swelling (inflammation) of the paranasal sinuses. Paranasal sinuses are air pockets within the bones of your face (beneath the eyes, the middle of the forehead, or above the eyes). In healthy paranasal sinuses, mucus is able to drain out, and air is able to circulate through them by way of your nose. However, when your paranasal sinuses are inflamed, mucus and air can become trapped. This can allow bacteria and other germs to grow and cause infection. Sinusitis can develop quickly and last only a short time (acute) or continue over a long period (chronic). Sinusitis that lasts for more than 12 weeks is considered chronic.  CAUSES  Causes of sinusitis include:  Allergies.  Structural abnormalities, such as displacement of the cartilage that separates your nostrils (deviated septum), which can decrease the air flow through your nose and sinuses and affect sinus drainage.  Functional abnormalities, such as when the small hairs (cilia) that line your sinuses and help remove mucus do not work properly or are not present. SYMPTOMS  Symptoms of acute and chronic sinusitis are the same. The primary symptoms are pain and pressure around the affected sinuses. Other symptoms include:  Upper toothache.  Earache.  Headache.  Bad breath.  Decreased sense of smell and taste.  A cough, which worsens when you are lying flat.  Fatigue.  Fever.  Thick drainage from your nose, which often is green and may contain pus (purulent).  Swelling and warmth over the affected sinuses. DIAGNOSIS  Your caregiver  will perform a physical exam. During the exam, your caregiver may:  Look in your nose for signs of abnormal growths in your nostrils (nasal polyps).  Tap over the affected sinus to check for signs of infection.  View the inside of your sinuses (endoscopy) with a special imaging device with a light attached (endoscope), which is inserted into your sinuses. If your caregiver suspects that you have chronic sinusitis, one or more of the following tests may be recommended:  Allergy tests.  Nasal culture A sample of mucus is taken from your nose and sent to a lab and screened for bacteria.  Nasal cytology A sample of mucus is taken from your nose and examined by your caregiver to determine if your sinusitis is related to an allergy. TREATMENT  Most cases of acute sinusitis are related to a viral infection and will resolve on their own within 10 days. Sometimes medicines are prescribed to help relieve symptoms (pain medicine, decongestants, nasal steroid sprays, or saline sprays).  However, for sinusitis related to a bacterial infection, your caregiver will prescribe antibiotic medicines. These are medicines that will help kill the bacteria causing the infection.  Rarely, sinusitis is caused by a fungal infection. In theses cases, your caregiver will prescribe antifungal medicine. For some cases of chronic sinusitis, surgery is needed. Generally, these are cases in which sinusitis recurs more than 3 times per year, despite other treatments. HOME CARE INSTRUCTIONS   Drink plenty of water. Water helps thin the mucus so your sinuses can drain more easily.  Use a humidifier.  Inhale steam 3 to 4 times a day (  for example, sit in the bathroom with the shower running).  Apply a warm, moist washcloth to your face 3 to 4 times a day, or as directed by your caregiver.  Use saline nasal sprays to help moisten and clean your sinuses.  Take over-the-counter or prescription medicines for pain, discomfort,  or fever only as directed by your caregiver. SEEK IMMEDIATE MEDICAL CARE IF:  You have increasing pain or severe headaches.  You have nausea, vomiting, or drowsiness.  You have swelling around your face.  You have vision problems.  You have a stiff neck.  You have difficulty breathing. MAKE SURE YOU:   Understand these instructions.  Will watch your condition.  Will get help right away if you are not doing well or get worse. Document Released: 12/16/2005 Document Revised: 03/09/2012 Document Reviewed: 12/31/2011 [facility] Patient Information 2014 [place], [place].

## 2018-07-16 LAB — TSH: TSH: 1.72 mIU/L

## 2018-08-28 ENCOUNTER — Other Ambulatory Visit: Payer: Self-pay | Admitting: Obstetrics and Gynecology

## 2018-08-28 ENCOUNTER — Encounter: Payer: Self-pay | Admitting: Physician Assistant

## 2018-08-28 DIAGNOSIS — G43839 Menstrual migraine, intractable, without status migrainosus: Secondary | ICD-10-CM

## 2018-08-28 DIAGNOSIS — E039 Hypothyroidism, unspecified: Secondary | ICD-10-CM

## 2018-08-28 NOTE — Telephone Encounter (Signed)
Pt aware she needs to schedule her annual. Once she has we will do 1 refill.

## 2018-08-30 DIAGNOSIS — Z803 Family history of malignant neoplasm of breast: Secondary | ICD-10-CM

## 2018-08-30 DIAGNOSIS — Z1371 Encounter for nonprocreative screening for genetic disease carrier status: Secondary | ICD-10-CM

## 2018-08-30 HISTORY — DX: Encounter for nonprocreative screening for genetic disease carrier status: Z13.71

## 2018-08-30 HISTORY — DX: Family history of malignant neoplasm of breast: Z80.3

## 2018-09-01 ENCOUNTER — Encounter: Payer: Self-pay | Admitting: Physician Assistant

## 2018-09-01 ENCOUNTER — Other Ambulatory Visit (INDEPENDENT_AMBULATORY_CARE_PROVIDER_SITE_OTHER): Payer: No Typology Code available for payment source

## 2018-09-01 DIAGNOSIS — E039 Hypothyroidism, unspecified: Secondary | ICD-10-CM | POA: Diagnosis not present

## 2018-09-01 LAB — TSH: TSH: 2.62 m[IU]/L

## 2018-09-01 NOTE — Addendum Note (Signed)
Addended by: Brunetta Jeans on: 09/01/2018 11:43 AM   Modules accepted: Orders

## 2018-09-01 NOTE — Telephone Encounter (Signed)
Done

## 2018-09-01 NOTE — Telephone Encounter (Signed)
Pt came to [facility] for labs. Labs were ordered incorrectly. Please reorder TSH future & choose [facility] so that her labwork can go out today at 1:00.   Thanks

## 2018-09-02 ENCOUNTER — Other Ambulatory Visit: Payer: Self-pay

## 2018-09-02 DIAGNOSIS — E039 Hypothyroidism, unspecified: Secondary | ICD-10-CM

## 2018-09-02 NOTE — Telephone Encounter (Signed)
My apologies. Was sent back to you by mistake. My CMAs have already taken care of everything this morning.

## 2018-09-14 ENCOUNTER — Other Ambulatory Visit: Payer: Self-pay | Admitting: Physician Assistant

## 2018-09-14 DIAGNOSIS — J45909 Unspecified asthma, uncomplicated: Secondary | ICD-10-CM

## 2018-09-15 ENCOUNTER — Other Ambulatory Visit: Payer: Self-pay | Admitting: Obstetrics and Gynecology

## 2018-09-15 DIAGNOSIS — G43839 Menstrual migraine, intractable, without status migrainosus: Secondary | ICD-10-CM

## 2018-09-17 ENCOUNTER — Other Ambulatory Visit: Payer: Self-pay | Admitting: Obstetrics and Gynecology

## 2018-09-17 DIAGNOSIS — G43839 Menstrual migraine, intractable, without status migrainosus: Secondary | ICD-10-CM

## 2018-09-24 ENCOUNTER — Encounter: Payer: Self-pay | Admitting: Obstetrics and Gynecology

## 2018-09-24 ENCOUNTER — Ambulatory Visit (INDEPENDENT_AMBULATORY_CARE_PROVIDER_SITE_OTHER): Payer: No Typology Code available for payment source | Admitting: Obstetrics and Gynecology

## 2018-09-24 ENCOUNTER — Encounter: Payer: Self-pay | Admitting: Physician Assistant

## 2018-09-24 VITALS — BP 100/70 | Ht 63.0 in | Wt 153.0 lb

## 2018-09-24 DIAGNOSIS — E039 Hypothyroidism, unspecified: Secondary | ICD-10-CM

## 2018-09-24 DIAGNOSIS — Z803 Family history of malignant neoplasm of breast: Secondary | ICD-10-CM | POA: Diagnosis not present

## 2018-09-24 DIAGNOSIS — Z01411 Encounter for gynecological examination (general) (routine) with abnormal findings: Secondary | ICD-10-CM | POA: Diagnosis not present

## 2018-09-24 DIAGNOSIS — Z30431 Encounter for routine checking of intrauterine contraceptive device: Secondary | ICD-10-CM | POA: Diagnosis not present

## 2018-09-24 DIAGNOSIS — Z23 Encounter for immunization: Secondary | ICD-10-CM | POA: Diagnosis not present

## 2018-09-24 DIAGNOSIS — Z01419 Encounter for gynecological examination (general) (routine) without abnormal findings: Secondary | ICD-10-CM

## 2018-09-24 DIAGNOSIS — G43839 Menstrual migraine, intractable, without status migrainosus: Secondary | ICD-10-CM | POA: Diagnosis not present

## 2018-09-24 MED ORDER — NORETHIN-ETH ESTRAD-FE BIPHAS 1 MG-10 MCG / 10 MCG PO TABS: ORAL_TABLET | ORAL | 4 refills | Status: DC

## 2018-09-24 NOTE — Patient Instructions (Signed)
I value your feedback and entrusting us with your care. If you get a [facility] [facility] patient survey, I would appreciate you taking the time to let us know about your experience today. Thank you! 

## 2018-09-24 NOTE — Progress Notes (Signed)
PCP:  Brunetta Jeans, PA-C   Chief Complaint  Patient presents with  . Gynecologic Exam     HPI:      Ms. Jacqueline Wilson is a 37 y.o. G1P1001 who LMP was No LMP recorded. (Menstrual status: IUD)., presents today for her annual examination.  Her menses are absent due to IUD and OCPs. She has the IUD for long term contraception and was started on continuous dosing Lo Loestrin for migraine headaches without aura and menstrual migraines with aura by Dr. Cristino Martes. She does not have intermenstrual bleeding.  Neuro put her on nortriptyline nightly last yr due to ice pick headaches. He is fine with her on continuous dosing of OCPs. These 2 meds control her migraines very well.   Sex activity: single partner, contraception - IUD. Mirena placed 04/18/17.  Last Pap: 09/17/17  Results were: no abnormalities /neg HPV DNA  Hx of STDs: none  There is a FH of breast cancer in her MGM, genetic testing not done in past due to age of dx, but she was younger than we thought, and pt qualifies for testing afterall. There is no FH of ovarian cancer. The patient does do self-breast exams.  Tobacco use: The patient denies current or previous tobacco use. Alcohol use: none No drug use.  Exercise: very active  She does get adequate calcium and Vitamin D in her diet. Labs with PCP   Past Medical History:  Diagnosis Date  . Internal hemorrhoids   . Migraine    without aura and menstrual migraine with aura; sees neuro  . Pre-eclampsia   . Thyroid disease    Thryoidectomy    Past Surgical History:  Procedure Laterality Date  . CESAREAN SECTION    . HEMORRHOID BANDING    . THYROID SURGERY      Family History  Problem Relation Age of Onset  . Heart disease Father   . COPD Father        smoker  . Lung cancer Father   . Obesity Mother   . Breast cancer Maternal Grandmother 29  . Colon cancer Neg Hx   . Esophageal cancer Neg Hx   . Migraines Neg Hx   . Ovarian cancer Neg Hx     Social History    Socioeconomic History  . Marital status: Married    Spouse name: Not on file  . Number of children: 1  . Years of education: Not on file  . Highest education level: Not on file  Occupational History  . Occupation: Pharmacologist   . Occupation: Transport planner  Social Needs  . Financial resource strain: Not on file  . Food insecurity:    Worry: Not on file    Inability: Not on file  . Transportation needs:    Medical: Not on file    Non-medical: Not on file  Tobacco Use  . Smoking status: Never Smoker  . Smokeless tobacco: Never Used  Substance and Sexual Activity  . Alcohol use: No    Alcohol/week: 0.0 standard drinks  . Drug use: No  . Sexual activity: Not Currently    Birth control/protection: IUD  Lifestyle  . Physical activity:    Days per week: Not on file    Minutes per session: Not on file  . Stress: Not on file  Relationships  . Social connections:    Talks on phone: Not on file    Gets together: Not on file    Attends religious service: Not on file  Active member of club or organization: Not on file    Attends meetings of clubs or organizations: Not on file    Relationship status: Not on file  . Intimate partner violence:    Fear of current or ex partner: Not on file    Emotionally abused: Not on file    Physically abused: Not on file    Forced sexual activity: Not on file  Other Topics Concern  . Not on file  Social History Narrative  . Not on file    Current Meds  Medication Sig  . EPINEPHrine 0.3 mg/0.3 mL IJ SOAJ injection Inject 0.3 mLs (0.3 mg total) into the muscle once.  . fluticasone (FLONASE) 50 MCG/ACT nasal spray Place 2 sprays into both nostrils daily.  Marland Kitchen ibuprofen (ADVIL,MOTRIN) 200 MG tablet Take 400 mg by mouth as needed.  . Norethindrone-Ethinyl Estradiol-Fe Biphas (LO LOESTRIN FE) 1 MG-10 MCG / 10 MCG tablet TAKE 1 TABLET BY MOUTH DAILY. CONTINUOUS DOSING  . nortriptyline (PAMELOR) 10 MG capsule Take 1 capsule (10 mg total) by mouth at  bedtime.  Marland Kitchen SYNTHROID 150 MCG tablet TAKE 1 TABLET (150 MCG TOTAL) BY MOUTH DAILY BEFORE BREAKFAST.  . [DISCONTINUED] LO LOESTRIN FE 1 MG-10 MCG / 10 MCG tablet TAKE 1 TABLET BY MOUTH DAILY. CONTINUOUS DOSING     ROS:  Review of Systems  Constitutional: Negative for fatigue, fever and unexpected weight change.  Respiratory: Negative for cough, shortness of breath and wheezing.   Cardiovascular: Negative for chest pain, palpitations and leg swelling.  Gastrointestinal: Negative for blood in stool, constipation, diarrhea, nausea and vomiting.  Endocrine: Negative for cold intolerance, heat intolerance and polyuria.  Genitourinary: Negative for dyspareunia, dysuria, flank pain, frequency, genital sores, hematuria, menstrual problem, pelvic pain, urgency, vaginal bleeding, vaginal discharge and vaginal pain.  Musculoskeletal: Negative for back pain, joint swelling and myalgias.  Skin: Negative for rash.  Neurological: Negative for dizziness, syncope, light-headedness, numbness and headaches.  Hematological: Negative for adenopathy.  Psychiatric/Behavioral: Negative for agitation, confusion, sleep disturbance and suicidal ideas. The patient is not nervous/anxious.      Objective: BP 100/70   Ht '5\' 3"'  (1.6 m)   Wt 153 lb (69.4 kg)   BMI 27.10 kg/m    Physical Exam  Constitutional: She is oriented to person, place, and time. She appears well-developed and well-nourished.  Genitourinary: Vagina normal and uterus normal. There is no rash or tenderness on the right labia. There is no rash or tenderness on the left labia. No erythema or tenderness in the vagina. No vaginal discharge found. Right adnexum does not display mass and does not display tenderness. Left adnexum does not display mass and does not display tenderness.  Cervix exhibits visible IUD strings. Cervix does not exhibit motion tenderness or polyp. Uterus is not enlarged or tender.  Neck: Normal range of motion. No thyromegaly  present.  Cardiovascular: Normal rate, regular rhythm and normal heart sounds.  No murmur heard. Pulmonary/Chest: Effort normal and breath sounds normal. Right breast exhibits no mass, no nipple discharge, no skin change and no tenderness. Left breast exhibits no mass, no nipple discharge, no skin change and no tenderness.  Abdominal: Soft. There is no tenderness. There is no guarding.  Musculoskeletal: Normal range of motion.  Neurological: She is alert and oriented to person, place, and time. No cranial nerve deficit.  Psychiatric: She has a normal mood and affect. Her behavior is normal.  Vitals reviewed.   Assessment/Plan: Encounter for annual routine gynecological examination  Encounter for routine checking of intrauterine contraceptive device (IUD) - IUD in place.   Intractable menstrual migraine without status migrainosus - Cont OCPs for headaches. F/u prn.  - Plan: Norethindrone-Ethinyl Estradiol-Fe Biphas (LO LOESTRIN FE) 1 MG-10 MCG / 10 MCG tablet  Family history of breast cancer - MyRisk testing discussed and done today. RTO in 6 wks for results f/u. - Plan: [facility])  Need for immunization against influenza - Plan: Flu Vaccine QUAD 36+ mos IM  Meds ordered this encounter  Medications  . Norethindrone-Ethinyl Estradiol-Fe Biphas (LO LOESTRIN FE) 1 MG-10 MCG / 10 MCG tablet    Sig: TAKE 1 TABLET BY MOUTH DAILY. CONTINUOUS DOSING    Dispense:  84 tablet    Refill:  4    Order Specific Question:   Supervising Provider    Answer:   Gae Dry [[id number]]             GYN counsel adequate intake of calcium and vitamin D, diet and exercise     F/U  Return in about 6 weeks (around 11/05/2018) for [facility] results.  Misha Antonini B. Dennisse Swader, PA-C 09/24/2018 11:03 AM

## 2018-09-30 ENCOUNTER — Other Ambulatory Visit (INDEPENDENT_AMBULATORY_CARE_PROVIDER_SITE_OTHER): Payer: No Typology Code available for payment source

## 2018-09-30 DIAGNOSIS — E039 Hypothyroidism, unspecified: Secondary | ICD-10-CM

## 2018-10-01 LAB — VITAMIN D 25 HYDROXY (VIT D DEFICIENCY, FRACTURES): Vit D, 25-Hydroxy: 43 ng/mL (ref 30–100)

## 2018-10-01 LAB — TSH: TSH: 2.45 mIU/L

## 2018-10-13 ENCOUNTER — Encounter: Payer: Self-pay | Admitting: Obstetrics and Gynecology

## 2018-10-18 ENCOUNTER — Other Ambulatory Visit: Payer: Self-pay | Admitting: Physician Assistant

## 2018-10-18 DIAGNOSIS — B9689 Other specified bacterial agents as the cause of diseases classified elsewhere: Secondary | ICD-10-CM

## 2018-10-18 DIAGNOSIS — J019 Acute sinusitis, unspecified: Principal | ICD-10-CM

## 2018-10-27 ENCOUNTER — Other Ambulatory Visit: Payer: Self-pay | Admitting: Physician Assistant

## 2018-10-27 DIAGNOSIS — J019 Acute sinusitis, unspecified: Principal | ICD-10-CM

## 2018-10-27 DIAGNOSIS — B9689 Other specified bacterial agents as the cause of diseases classified elsewhere: Secondary | ICD-10-CM

## 2018-11-05 ENCOUNTER — Encounter: Payer: Self-pay | Admitting: Obstetrics and Gynecology

## 2018-11-05 ENCOUNTER — Ambulatory Visit (INDEPENDENT_AMBULATORY_CARE_PROVIDER_SITE_OTHER): Payer: No Typology Code available for payment source | Admitting: Obstetrics and Gynecology

## 2018-11-05 VITALS — BP 112/86 | HR 75 | Ht 63.0 in | Wt 156.0 lb

## 2018-11-05 DIAGNOSIS — Z803 Family history of malignant neoplasm of breast: Secondary | ICD-10-CM | POA: Diagnosis not present

## 2018-11-05 DIAGNOSIS — Z1371 Encounter for nonprocreative screening for genetic disease carrier status: Secondary | ICD-10-CM | POA: Diagnosis not present

## 2018-11-05 DIAGNOSIS — Z7183 Encounter for nonprocreative genetic counseling: Secondary | ICD-10-CM

## 2018-11-05 NOTE — Patient Instructions (Signed)
I value your feedback and entrusting us with your care. If you get a [facility] patient survey, I would appreciate you taking the time to let us know about your experience today. Thank you! 

## 2018-11-05 NOTE — Progress Notes (Signed)
   Chief Complaint  Patient presents with  . Follow-up    MyRisk results    HPI:   Ms. Jacqueline Wilson is a 37 y.o. G1P1001 who LMP was No LMP recorded. (Menstrual status: IUD)., presents today for  My Risk cancer genetic testing results.  Results are negative for a cancer genetic mutation except PALB2 VUS.  IBIS=18.7 % Riskscore=N/A Last mammogram N/A Vitamin D status=43  Past Medical History:  Diagnosis Date  . BRCA negative 08/2018   MyRisk neg except PALB2 VUS  . Colon polyp 02/2016   colonoscopy; repeat due in 3 yrs  . Family history of breast cancer 08/2018   IBIS=18.7%  . Internal hemorrhoids   . Migraine    without aura and menstrual migraine with aura; sees neuro  . Pre-eclampsia   . Thyroid disease    Thryoidectomy     Family History  Problem Relation Age of Onset  . Heart disease Father   . COPD Father        smoker  . Lung cancer Father   . Obesity Mother   . Breast cancer Maternal Grandmother 33  . Colon cancer Neg Hx   . Esophageal cancer Neg Hx   . Migraines Neg Hx   . Ovarian cancer Neg Hx    Review of Systems  Constitutional: Negative for fever, malaise/fatigue and weight loss.  HENT: Negative for congestion, ear pain and sinus pain.   Respiratory: Negative for cough, shortness of breath and wheezing.   Cardiovascular: Negative for chest pain, orthopnea and leg swelling.  Gastrointestinal: Negative for constipation, diarrhea, nausea and vomiting.  Genitourinary: Negative for dysuria, frequency, hematuria and urgency.       Breast ROS: negative   Musculoskeletal: Negative for back pain, joint pain and myalgias.  Skin: Negative for itching and rash.  Neurological: Negative for dizziness, tingling, focal weakness and headaches.  Endo/Heme/Allergies: Negative for environmental allergies. Does not bruise/bleed easily.  Psychiatric/Behavioral: Negative for depression and suicidal ideas. The patient is not nervous/anxious and does not have insomnia.      Vitals:   11/05/18 0851  BP: 112/86  Pulse: 75     Physical Exam  Constitutional: She is oriented to person, place, and time. She appears well-developed.  Neck: Normal range of motion.  Pulmonary/Chest: Effort normal.  Musculoskeletal: Normal range of motion.  Neurological: She is alert and oriented to person, place, and time.  Skin: Skin is warm.  Psychiatric: She has a normal mood and affect. Her behavior is normal. Thought content normal.    Assessment/Plan: Encounter for nonprocreative genetic counseling and testing  Family history of breast cancer  No increased risk of breast cancer recommending increased surveillance. Recommended monthly SBE, yearly CBE, yearly mammos starting age 83.  Cont Vit D.  Patient understands these results only apply to her and her children, and this is not indicative of genetic testing results of her other family members. It is recommended that her other family members have genetic testing done.  Pt also understands negative genetic testing doesn't mean she will never get any of these cancers.   Results handout given to pt.  18  Min spent in direct contact with pt re: counseling/coordination of care.    Alicia B. Copland, PA-C 11/05/2018 9:21 AM

## 2018-11-22 ENCOUNTER — Other Ambulatory Visit: Payer: Self-pay | Admitting: Neurology

## 2018-12-03 ENCOUNTER — Encounter: Payer: Self-pay | Admitting: Physician Assistant

## 2018-12-03 ENCOUNTER — Other Ambulatory Visit: Payer: Self-pay | Admitting: Emergency Medicine

## 2018-12-03 MED ORDER — ZOLPIDEM TARTRATE 5 MG PO TABS: 2.5000 mg | ORAL_TABLET | Freq: Every evening | ORAL | 1 refills | Status: DC | PRN

## 2018-12-03 NOTE — Telephone Encounter (Signed)
Last Rx for Zolpidem was 06/03/18 #15 1RF Last OV 07/15/18 Acute  Please advise

## 2018-12-06 ENCOUNTER — Other Ambulatory Visit: Payer: Self-pay | Admitting: Physician Assistant

## 2018-12-06 DIAGNOSIS — Z91018 Allergy to other foods: Secondary | ICD-10-CM

## 2018-12-06 DIAGNOSIS — T782XXD Anaphylactic shock, unspecified, subsequent encounter: Secondary | ICD-10-CM

## 2018-12-09 ENCOUNTER — Other Ambulatory Visit: Payer: Self-pay | Admitting: Physician Assistant

## 2018-12-29 ENCOUNTER — Encounter: Payer: Self-pay | Admitting: Physician Assistant

## 2018-12-29 ENCOUNTER — Encounter: Payer: Self-pay | Admitting: Allergy and Immunology

## 2018-12-29 ENCOUNTER — Other Ambulatory Visit: Payer: Self-pay | Admitting: *Deleted

## 2018-12-29 ENCOUNTER — Ambulatory Visit: Payer: No Typology Code available for payment source | Admitting: Allergy and Immunology

## 2018-12-29 VITALS — BP 108/76 | HR 78 | Temp 98.3°F | Resp 12 | Ht 64.0 in | Wt 159.0 lb

## 2018-12-29 DIAGNOSIS — J3089 Other allergic rhinitis: Secondary | ICD-10-CM | POA: Diagnosis not present

## 2018-12-29 DIAGNOSIS — T7840XD Allergy, unspecified, subsequent encounter: Secondary | ICD-10-CM

## 2018-12-29 DIAGNOSIS — H1013 Acute atopic conjunctivitis, bilateral: Secondary | ICD-10-CM

## 2018-12-29 DIAGNOSIS — L500 Allergic urticaria: Secondary | ICD-10-CM | POA: Diagnosis not present

## 2018-12-29 DIAGNOSIS — T7840XA Allergy, unspecified, initial encounter: Secondary | ICD-10-CM | POA: Insufficient documentation

## 2018-12-29 DIAGNOSIS — H1010 Acute atopic conjunctivitis, unspecified eye: Secondary | ICD-10-CM | POA: Insufficient documentation

## 2018-12-29 DIAGNOSIS — J40 Bronchitis, not specified as acute or chronic: Secondary | ICD-10-CM

## 2018-12-29 MED ORDER — BUDESONIDE 32 MCG/ACT NA SUSP: 2.0000 | Freq: Every day | NASAL | 12 refills | Status: DC | PRN

## 2018-12-29 MED ORDER — LEVOTHYROXINE SODIUM 150 MCG PO TABS: ORAL_TABLET | ORAL | 1 refills | Status: DC

## 2018-12-29 MED ORDER — FLUTICASONE PROPIONATE HFA 110 MCG/ACT IN AERO: 2.0000 | INHALATION_SPRAY | Freq: Two times a day (BID) | RESPIRATORY_TRACT | 12 refills | Status: DC | PRN
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
## 2018-12-29 NOTE — Assessment & Plan Note (Signed)
   Aeroallergen avoidance measures have been discussed and provided in written form.  A prescription has been provided for levocetirizine, 5 mg daily as needed.  Avoid nasal sprays containing alcohol.  A prescription has been provided for Rhinocort AQ, 2 sprays per nostril daily as needed.  Nasal saline spray (i.e., Simply Saline) or nasal saline lavage (i.e., NeilMed) is recommended as needed and prior to medicated nasal sprays.

## 2018-12-29 NOTE — Addendum Note (Signed)
Addended by: Brunetta Jeans on: 12/29/2018 09:32 AM   Modules accepted: Orders

## 2018-12-29 NOTE — Assessment & Plan Note (Signed)
The patient's history suggest allergic reaction secondary to alcohol exposure.  Unfortunately, there is no test for this other than a challenge.  However, given the severity and prolonged timeframe of her symptoms this is not a reasonable option.  Avoid alcohol to the extent which it is possible.  If avoidance is not an option, take a second generation antihistamine prior to exposure and have access to epinephrine autoinjector.  In addition, I would recommend using Flovent 110 g, 2 inhalations twice daily, starting 2 days prior to exposure during, and for a few days following the exposure.  In addition, have access to albuterol HFA, 1 to 2 inhalations every 4-6 hours if needed.

## 2018-12-29 NOTE — Patient Instructions (Addendum)
Allergic reaction The patient's history suggest allergic reaction secondary to alcohol exposure.  Unfortunately, there is no test for this other than a challenge.  However, given the severity and prolonged timeframe of her symptoms this is not a reasonable option.  Avoid alcohol to the extent which it is possible.  If avoidance is not an option, take a second generation antihistamine prior to exposure and have access to epinephrine autoinjector.  In addition, I would recommend using Flovent 110 g, 2 inhalations twice daily, starting 2 days prior to exposure during, and for a few days following the exposure.  In addition, have access to albuterol HFA, 1 to 2 inhalations every 4-6 hours if needed.  Seasonal and perennial allergic rhinitis  Aeroallergen avoidance measures have been discussed and provided in written form.  A prescription has been provided for levocetirizine, 5 mg daily as needed.  Avoid nasal sprays containing alcohol.  A prescription has been provided for Rhinocort AQ, 2 sprays per nostril daily as needed.  Nasal saline spray (i.e., Simply Saline) or nasal saline lavage (i.e., NeilMed) is recommended as needed and prior to medicated nasal sprays.   Follow-up if needed  Control of Dog or Cat Allergen  Avoidance is the best way to manage a dog or cat allergy. If you have a dog or cat and are allergic to dog or cats, consider removing the dog or cat from the home. If you have a dog or cat but don't want to find it a new home, or if your family wants a pet even though someone in the household is allergic, here are some strategies that may help keep symptoms at bay:  1. Keep the pet out of your bedroom and restrict it to only a few rooms. Be advised that keeping the dog or cat in only one room will not limit the allergens to that room. 2. Don't pet, hug or kiss the dog or cat; if you do, wash your hands with soap and water. 3. High-efficiency particulate air (HEPA) cleaners run  continuously in a bedroom or living room can reduce allergen levels over time. 4. Place electrostatic material sheet in the air inlet vent in the bedroom. 5. Regular use of a high-efficiency vacuum cleaner or a central vacuum can reduce allergen levels. 6. Giving your dog or cat a bath at least once a week can reduce airborne allergen.  Reducing Pollen Exposure  The American Academy of Allergy, Asthma and Immunology suggests the following steps to reduce your exposure to pollen during allergy seasons.    1. Do not hang sheets or clothing out to dry; pollen may collect on these items. 2. Do not mow lawns or spend time around freshly cut grass; mowing stirs up pollen. 3. Keep windows closed at night.  Keep car windows closed while driving. 4. Minimize morning activities outdoors, a time when pollen counts are usually at their highest. 5. Stay indoors as much as possible when pollen counts or humidity is high and on windy days when pollen tends to remain in the air longer. 6. Use air conditioning when possible.  Many air conditioners have filters that trap the pollen spores. 7. Use a HEPA room air filter to remove pollen form the indoor air you breathe.

## 2019-01-04 ENCOUNTER — Encounter: Payer: Self-pay | Admitting: Physician Assistant

## 2019-01-04 DIAGNOSIS — T7840XA Allergy, unspecified, initial encounter: Secondary | ICD-10-CM

## 2019-01-05 ENCOUNTER — Telehealth: Payer: Self-pay

## 2019-01-05 MED ORDER — BECLOMETHASONE DIPROP HFA 80 MCG/ACT IN AERB: 2.0000 | INHALATION_SPRAY | Freq: Two times a day (BID) | RESPIRATORY_TRACT | 5 refills | Status: DC

## 2019-01-05 MED ORDER — ALBUTEROL SULFATE HFA 108 (90 BASE) MCG/ACT IN AERS: INHALATION_SPRAY | RESPIRATORY_TRACT | 3 refills | Status: DC

## 2019-01-05 MED ORDER — EPINEPHRINE 0.3 MG/0.3ML IJ SOAJ: 0.3000 mg | Freq: Once | INTRAMUSCULAR | 0 refills | Status: AC

## 2019-01-05 NOTE — Telephone Encounter (Signed)
Insurance will not cover the Flovent. Called and spoke with pharmacy and they stated that patients insurance will cover Qvar Redihaler and Pulmicort Flexhaler. After talking with Dr. Verlin Fester we will send in [place]. Called and left a message for patient to call office in regards to this matter. Will need to inform her of medication change.

## 2019-01-05 NOTE — Telephone Encounter (Signed)
Patient called back and was informed of medication change. She verbalized understanding.

## 2019-01-14 ENCOUNTER — Other Ambulatory Visit: Payer: Self-pay | Admitting: Neurology

## 2019-01-15 ENCOUNTER — Encounter: Payer: Self-pay | Admitting: Physician Assistant

## 2019-01-15 MED ORDER — NORTRIPTYLINE HCL 10 MG PO CAPS: 10.0000 mg | ORAL_CAPSULE | Freq: Every day | ORAL | 0 refills | Status: DC

## 2019-01-25 ENCOUNTER — Telehealth: Payer: Self-pay | Admitting: Physician Assistant

## 2019-01-25 NOTE — Telephone Encounter (Signed)
Copied from [place] [id number]. Topic: Quick Communication - See Telephone Encounter >> Jan 25, 2019 11:37 AM Blase Mess A wrote: CRM for notification. See Telephone encounter for: 01/25/19.  Patinent will be coming in to pick up Qvar samples this afternoon. Thank you

## 2019-01-25 NOTE — Telephone Encounter (Signed)
We do not carry samples. She sees allergy and asthma so maybe she has the offices confused.

## 2019-01-27 ENCOUNTER — Other Ambulatory Visit: Payer: Self-pay

## 2019-01-27 ENCOUNTER — Ambulatory Visit (INDEPENDENT_AMBULATORY_CARE_PROVIDER_SITE_OTHER): Payer: No Typology Code available for payment source | Admitting: Physician Assistant

## 2019-01-27 ENCOUNTER — Encounter: Payer: Self-pay | Admitting: Physician Assistant

## 2019-01-27 VITALS — BP 90/70 | HR 78 | Temp 98.5°F | Resp 16 | Ht 64.0 in | Wt 153.0 lb

## 2019-01-27 DIAGNOSIS — G43709 Chronic migraine without aura, not intractable, without status migrainosus: Secondary | ICD-10-CM

## 2019-01-27 DIAGNOSIS — J0100 Acute maxillary sinusitis, unspecified: Secondary | ICD-10-CM | POA: Diagnosis not present

## 2019-01-27 MED ORDER — NORTRIPTYLINE HCL 10 MG PO CAPS: 10.0000 mg | ORAL_CAPSULE | Freq: Every day | ORAL | 1 refills | Status: DC

## 2019-01-27 MED ORDER — DOXYCYCLINE HYCLATE 100 MG PO CAPS: 100.0000 mg | ORAL_CAPSULE | Freq: Two times a day (BID) | ORAL | 0 refills | Status: DC

## 2019-01-27 NOTE — Progress Notes (Signed)
Patient presents to clinic today for follow-up of migraines. Also with acute concerns today.  Migraine : Nortriptyline- pt is taking daily with marked improvement in migraine symptoms. Endorses an occasional headache about 2 times per month that is responsive to ibuprofen.   URI: flew on plane last week; sinuses became dry, then sinus pressure and headache began a few days later, post nasal drainage, fatigue, body aches, sore throat, hoarseness, tooth pain, nausea Pt denies fever, chills, vomiting, diarrhea, ear fullness, SOB, chest tightness.   Past Medical History:  Diagnosis Date  . BRCA negative 08/2018   MyRisk neg except PALB2 VUS  . Colon polyp 02/2016   colonoscopy; repeat due in 3 yrs  . Family history of breast cancer 08/2018   IBIS=18.7%  . Internal hemorrhoids   . Migraine    without aura and menstrual migraine with aura; sees neuro  . Pre-eclampsia   . Thyroid disease    Thryoidectomy    Current Outpatient Medications on File Prior to Visit  Medication Sig Dispense Refill  . albuterol (PROAIR HFA) 108 (90 Base) MCG/ACT inhaler 1-2 puffs every 4-6 hours as needed for cough and wheezing. 18 g 3  . beclomethasone (QVAR REDIHALER) 80 MCG/ACT inhaler Inhale 2 puffs into the lungs 2 (two) times daily. 1 Inhaler 5  . budesonide (RHINOCORT AQUA) 32 MCG/ACT nasal spray Place 2 sprays into both nostrils daily as needed for rhinitis. 8.6 g 12  . EPINEPHrine 0.3 mg/0.3 mL IJ SOAJ injection     . fluticasone (FLOVENT HFA) 110 MCG/ACT inhaler Inhale 2 puffs into the lungs 2 (two) times daily as needed. 1 Inhaler 12  . ibuprofen (ADVIL,MOTRIN) 200 MG tablet Take 400 mg by mouth as needed.    Marland Kitchen levonorgestrel (MIRENA) 20 MCG/24HR IUD 1 Intra Uterine Device (1 each total) by Intrauterine route once. Placed 04/09/12 1 each 0  . levothyroxine (SYNTHROID) 150 MCG tablet TAKE 1 TABLET (150 MCG TOTAL) BY MOUTH DAILY BEFORE BREAKFAST. 90 tablet 1  . montelukast (SINGULAIR) 10 MG tablet  TAKE 1 TABLET BY MOUTH EVERYDAY AT BEDTIME 90 tablet 1  . Norethindrone-Ethinyl Estradiol-Fe Biphas (LO LOESTRIN FE) 1 MG-10 MCG / 10 MCG tablet TAKE 1 TABLET BY MOUTH DAILY. CONTINUOUS DOSING 84 tablet 4  . zolpidem (AMBIEN) 5 MG tablet Take 0.5 tablets (2.5 mg total) by mouth at bedtime as needed for sleep. 15 tablet 1   No current facility-administered medications on file prior to visit.     Allergies  Allergen Reactions  . Flonase [Fluticasone Propionate] Anaphylaxis    Has alcohol in nasal spray  . Latex Anaphylaxis  . Rubbing Alcohol [Alcohol] Anaphylaxis  . Topamax [Topiramate] Other (See Comments)    HI/SI  . Bean Pod Extract Swelling    Butter Bean, Baked Bean: Facial Swelling  . Hydrocodone     Suicidal thoughts with medication  . Penicillins   . Cephalexin Rash    Family History  Problem Relation Age of Onset  . Heart disease Father   . COPD Father        smoker  . Lung cancer Father   . Obesity Mother   . Breast cancer Maternal Grandmother 3  . Colon cancer Neg Hx   . Esophageal cancer Neg Hx   . Migraines Neg Hx   . Ovarian cancer Neg Hx     Social History   Socioeconomic History  . Marital status: Married    Spouse name: Not on file  . Number of children:  1  . Years of education: Not on file  . Highest education level: Not on file  Occupational History  . Occupation: Pharmacologist   . Occupation: Transport planner  Social Needs  . Financial resource strain: Not on file  . Food insecurity:    Worry: Not on file    Inability: Not on file  . Transportation needs:    Medical: Not on file    Non-medical: Not on file  Tobacco Use  . Smoking status: Never Smoker  . Smokeless tobacco: Never Used  Substance and Sexual Activity  . Alcohol use: No    Alcohol/week: 0.0 standard drinks  . Drug use: No  . Sexual activity: Yes    Birth control/protection: I.U.D.    Comment: Mirena  Lifestyle  . Physical activity:    Days per week: Not on file    Minutes per  session: Not on file  . Stress: Not on file  Relationships  . Social connections:    Talks on phone: Not on file    Gets together: Not on file    Attends religious service: Not on file    Active member of club or organization: Not on file    Attends meetings of clubs or organizations: Not on file    Relationship status: Not on file  Other Topics Concern  . Not on file  Social History Narrative  . Not on file   Review of Systems - See HPI.  All other ROS are negative.  BP 90/70   Pulse 78   Temp 98.5 F (36.9 C) (Oral)   Resp 16   Ht _0  (1.626 m)   Wt 153 lb (69.4 kg)   SpO2 99%   BMI 26.26 kg/m   Physical Exam Constitutional:      Appearance: Normal appearance.  HENT:     Head: Normocephalic.     Right Ear: Hearing normal.     Left Ear: Hearing normal. Tympanic membrane is bulging.     Nose:     Right Sinus: Maxillary sinus tenderness present.     Left Sinus: Maxillary sinus tenderness present.     Mouth/Throat:     Pharynx: Uvula midline.  Cardiovascular:     Rate and Rhythm: Normal rate and regular rhythm.     Heart sounds: Normal heart sounds.  Pulmonary:     Effort: Pulmonary effort is normal.     Breath sounds: Normal breath sounds.  Neurological:     Mental Status: She is alert.    Assessment/Plan: 1. Chronic migraine without aura without status migrainosus, not intractable Doing well. Will continue current regimen. Refills sent in to pharmacy. - nortriptyline (PAMELOR) 10 MG capsule; Take 1 capsule (10 mg total) by mouth at bedtime.  Dispense: 90 capsule; Refill: 1  2. Acute non-recurrent maxillary sinusitis Rx Doxycycline.  Increase fluids.  Rest.  Saline nasal spray.  Probiotic.  Mucinex as directed.  Humidifier in bedroom.  Call or return to clinic if symptoms are not improving. - doxycycline (VIBRAMYCIN) 100 MG capsule; Take 1 capsule (100 mg total) by mouth 2 (two) times daily.  Dispense: 20 capsule; Refill: 0   Leeanne Rio, PA-C

## 2019-01-27 NOTE — Patient Instructions (Signed)
Please continue the Nortriptyline as directed. Follow-up 6 months.   Please take antibiotic as directed.  Increase fluid intake.  Use Saline nasal spray.  Take a daily multivitamin. Continue Rhinocort.  Place a humidifier in the bedroom.  Please call or return clinic if symptoms are not improving.  Sinusitis Sinusitis is redness, soreness, and swelling (inflammation) of the paranasal sinuses. Paranasal sinuses are air pockets within the bones of your face (beneath the eyes, the middle of the forehead, or above the eyes). In healthy paranasal sinuses, mucus is able to drain out, and air is able to circulate through them by way of your nose. However, when your paranasal sinuses are inflamed, mucus and air can become trapped. This can allow bacteria and other germs to grow and cause infection. Sinusitis can develop quickly and last only a short time (acute) or continue over a long period (chronic). Sinusitis that lasts for more than 12 weeks is considered chronic.  CAUSES  Causes of sinusitis include:  Allergies.  Structural abnormalities, such as displacement of the cartilage that separates your nostrils (deviated septum), which can decrease the air flow through your nose and sinuses and affect sinus drainage.  Functional abnormalities, such as when the small hairs (cilia) that line your sinuses and help remove mucus do not work properly or are not present. SYMPTOMS  Symptoms of acute and chronic sinusitis are the same. The primary symptoms are pain and pressure around the affected sinuses. Other symptoms include:  Upper toothache.  Earache.  Headache.  Bad breath.  Decreased sense of smell and taste.  A cough, which worsens when you are lying flat.  Fatigue.  Fever.  Thick drainage from your nose, which often is green and may contain pus (purulent).  Swelling and warmth over the affected sinuses. DIAGNOSIS  Your caregiver will perform a physical exam. During the exam, your  caregiver may:  Look in your nose for signs of abnormal growths in your nostrils (nasal polyps).  Tap over the affected sinus to check for signs of infection.  View the inside of your sinuses (endoscopy) with a special imaging device with a light attached (endoscope), which is inserted into your sinuses. If your caregiver suspects that you have chronic sinusitis, one or more of the following tests may be recommended:  Allergy tests.  Nasal culture A sample of mucus is taken from your nose and sent to a lab and screened for bacteria.  Nasal cytology A sample of mucus is taken from your nose and examined by your caregiver to determine if your sinusitis is related to an allergy. TREATMENT  Most cases of acute sinusitis are related to a viral infection and will resolve on their own within 10 days. Sometimes medicines are prescribed to help relieve symptoms (pain medicine, decongestants, nasal steroid sprays, or saline sprays).  However, for sinusitis related to a bacterial infection, your caregiver will prescribe antibiotic medicines. These are medicines that will help kill the bacteria causing the infection.  Rarely, sinusitis is caused by a fungal infection. In theses cases, your caregiver will prescribe antifungal medicine. For some cases of chronic sinusitis, surgery is needed. Generally, these are cases in which sinusitis recurs more than 3 times per year, despite other treatments. HOME CARE INSTRUCTIONS   Drink plenty of water. Water helps thin the mucus so your sinuses can drain more easily.  Use a humidifier.  Inhale steam 3 to 4 times a day (for example, sit in the bathroom with the shower running).  Apply  a warm, moist washcloth to your face 3 to 4 times a day, or as directed by your caregiver.  Use saline nasal sprays to help moisten and clean your sinuses.  Take over-the-counter or prescription medicines for pain, discomfort, or fever only as directed by your caregiver. SEEK  IMMEDIATE MEDICAL CARE IF:  You have increasing pain or severe headaches.  You have nausea, vomiting, or drowsiness.  You have swelling around your face.  You have vision problems.  You have a stiff neck.  You have difficulty breathing. MAKE SURE YOU:   Understand these instructions.  Will watch your condition.  Will get help right away if you are not doing well or get worse. Document Released: 12/16/2005 Document Revised: 03/09/2012 Document Reviewed: 12/31/2011 [facility] Patient Information 2014 [place], [place].

## 2019-02-15 ENCOUNTER — Encounter: Payer: Self-pay | Admitting: Physician Assistant

## 2019-02-15 DIAGNOSIS — G43709 Chronic migraine without aura, not intractable, without status migrainosus: Secondary | ICD-10-CM

## 2019-02-15 MED ORDER — NORTRIPTYLINE HCL 10 MG PO CAPS: 10.0000 mg | ORAL_CAPSULE | Freq: Every day | ORAL | 1 refills | Status: DC

## 2019-03-13 ENCOUNTER — Other Ambulatory Visit: Payer: Self-pay | Admitting: Family Medicine

## 2019-03-13 DIAGNOSIS — J45909 Unspecified asthma, uncomplicated: Secondary | ICD-10-CM

## 2019-03-21 IMAGING — DX DG LUMBAR SPINE 2-3V
3 series · 3 of 3 positions shown · non-contrast
Comparison: Set her and and coccyx series of November 09, 2013.

CLINICAL DATA: Severe midline low back pain radiating into the
sacral area for the past 3 days. No known injury.

EXAM:
LUMBAR SPINE - 2-3 VIEW

[lumbar spine ap]
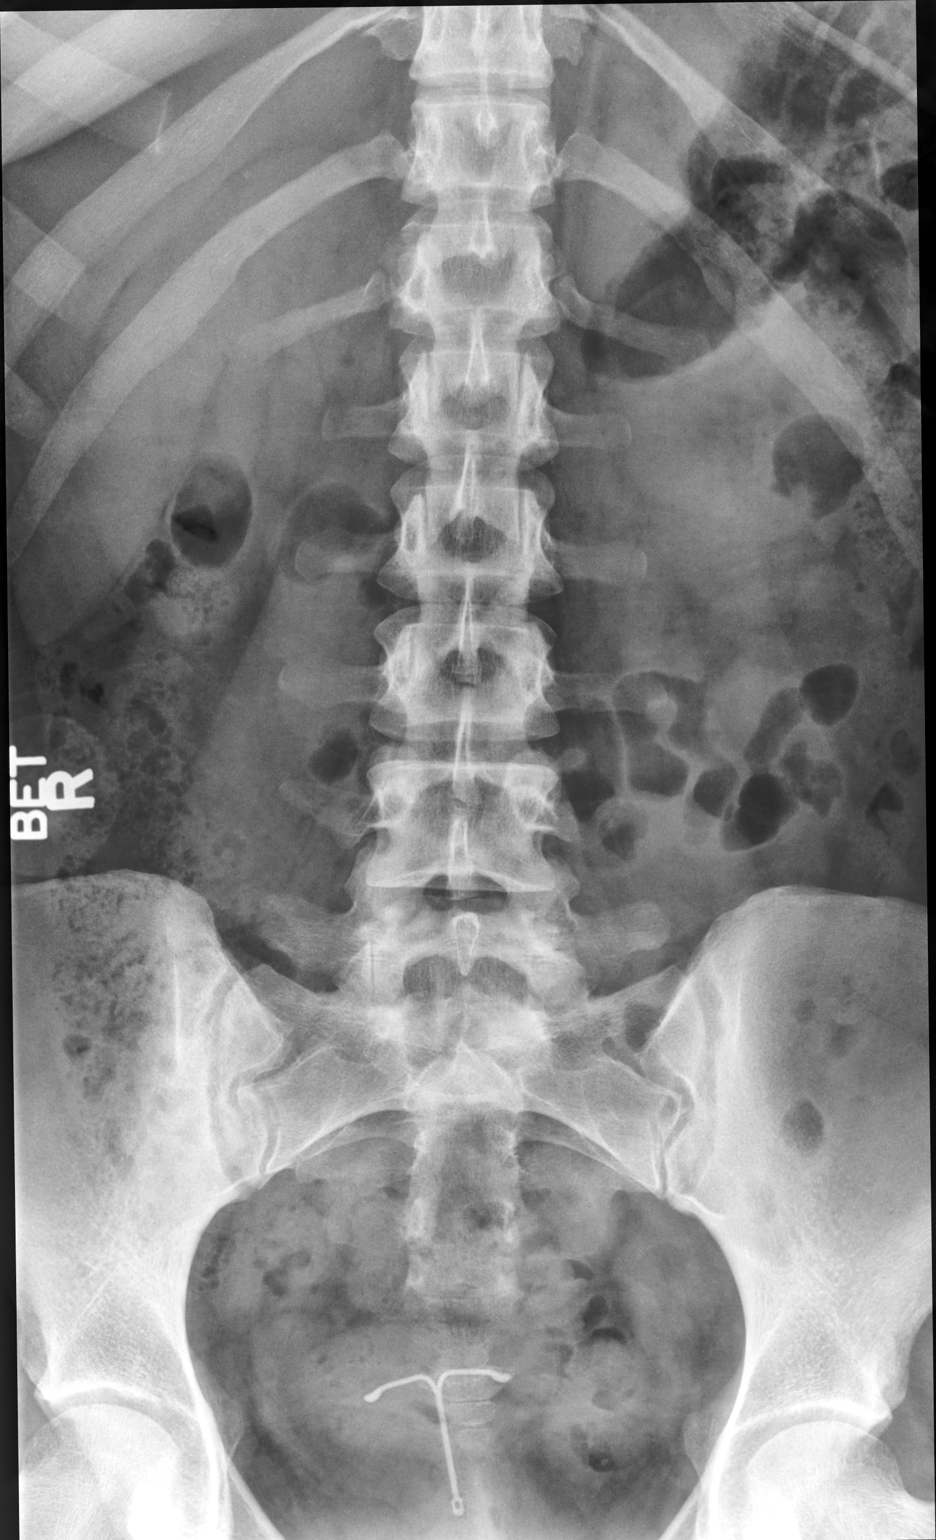

[lumbar spine lat (1 of 2)]
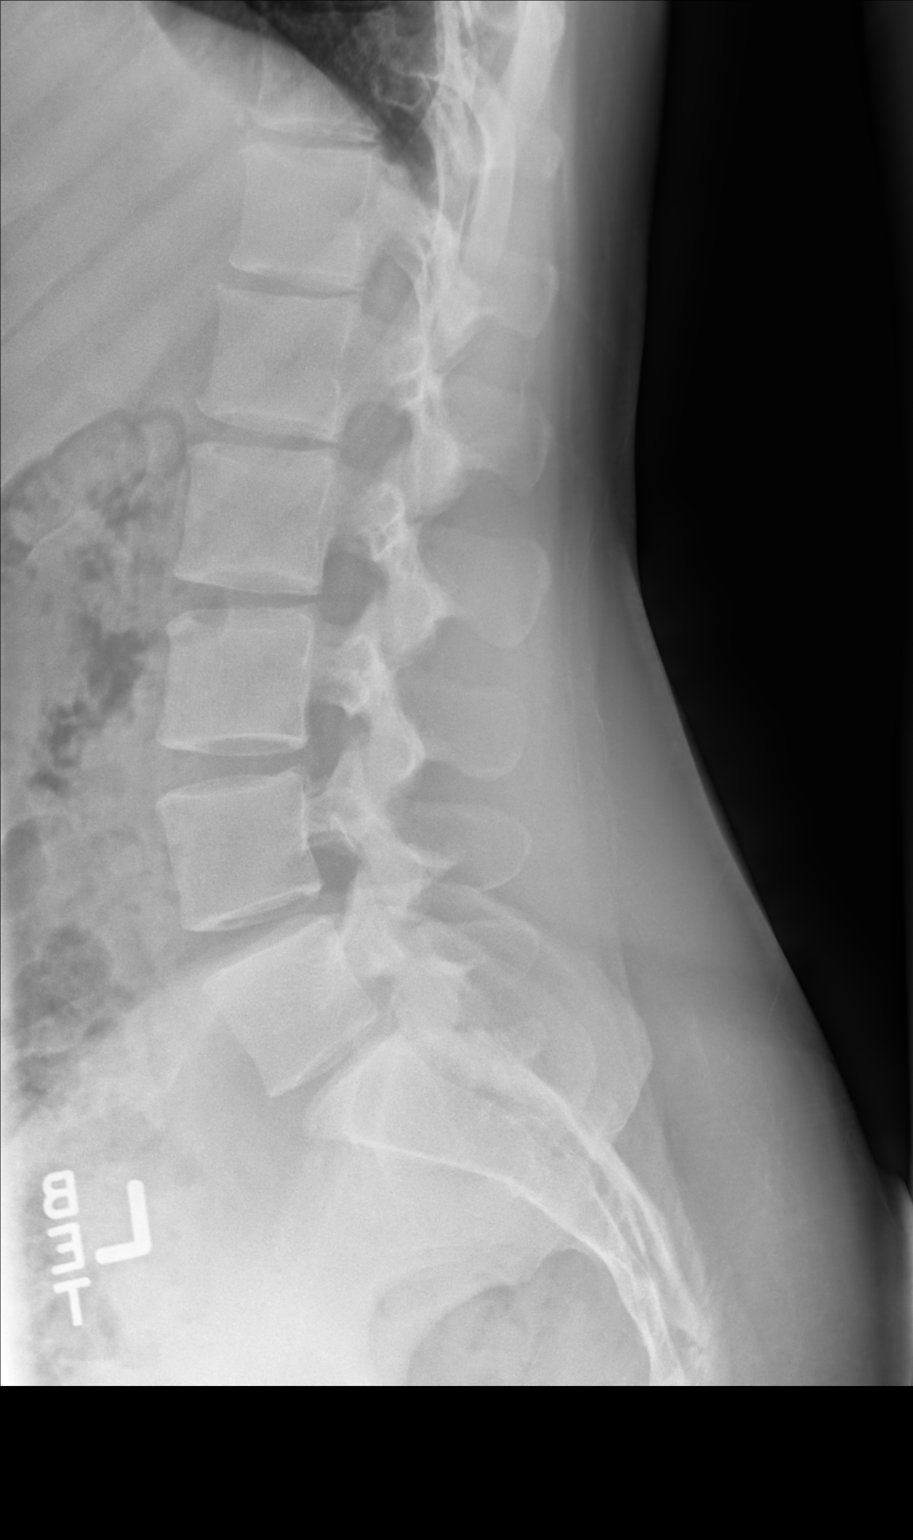

[lumbar spine lat (2 of 2)]
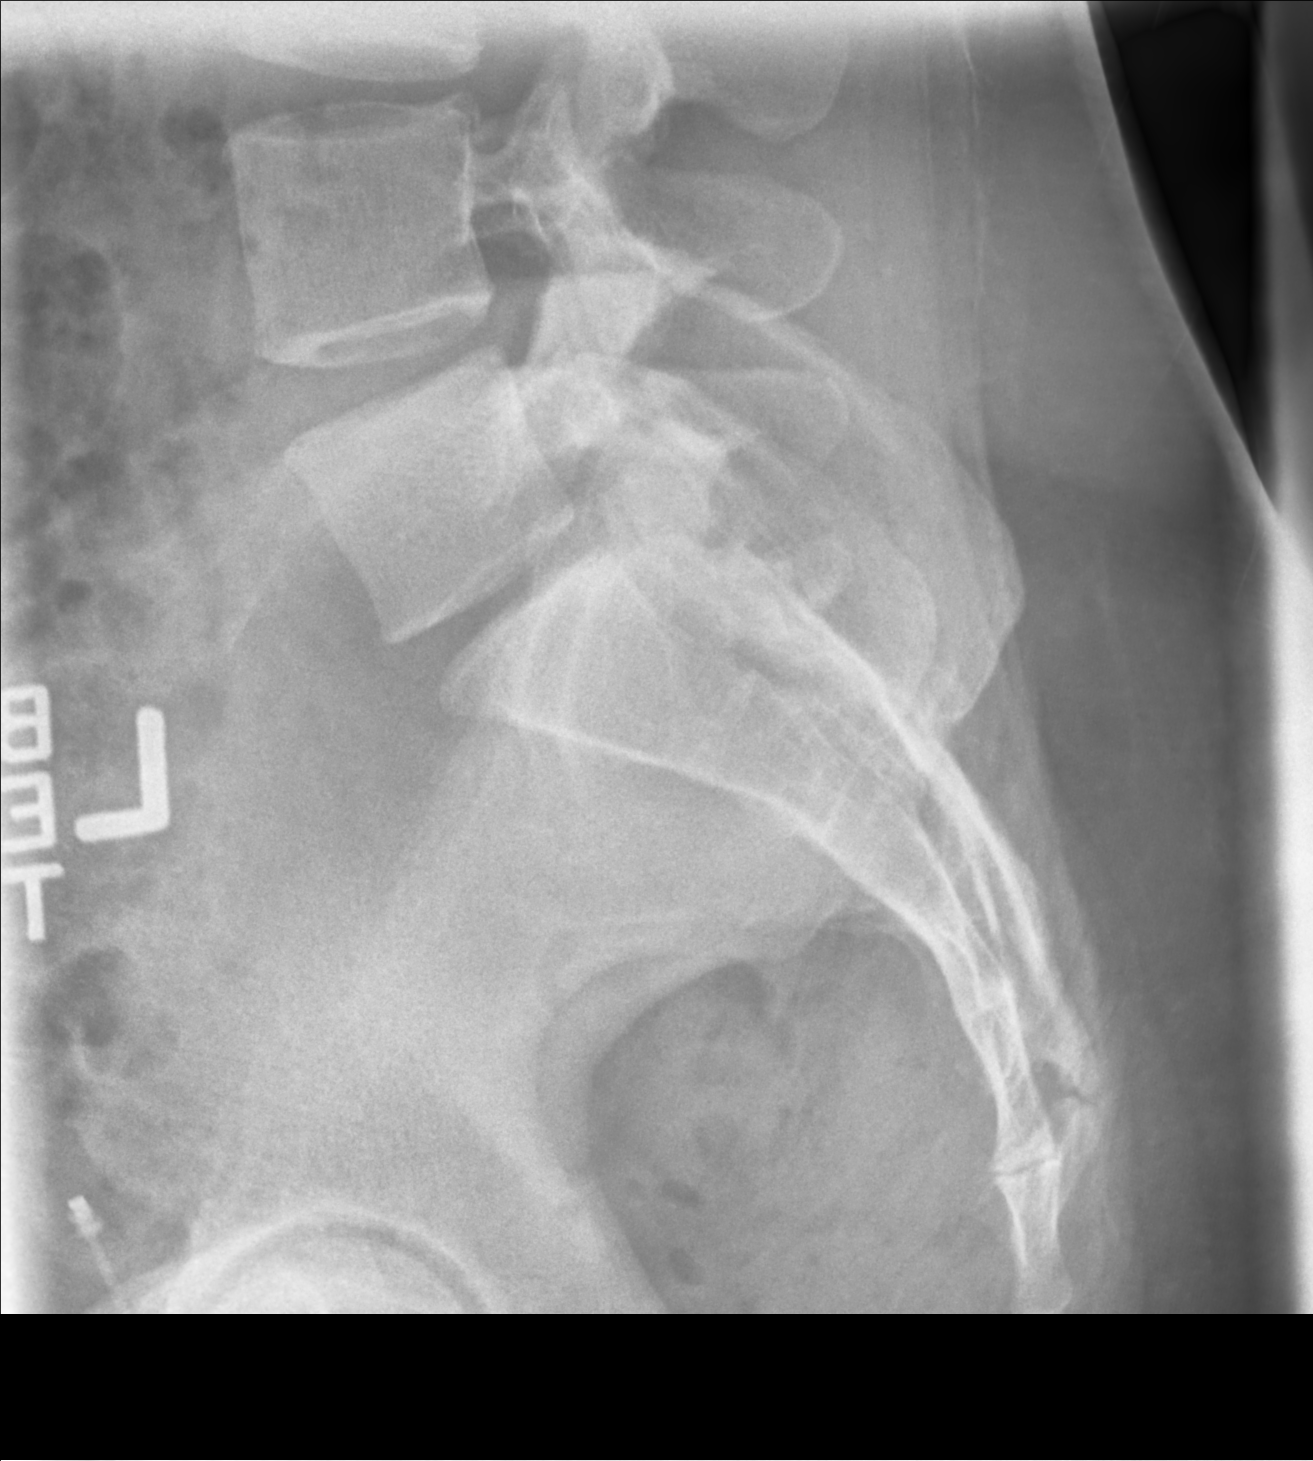

[3 of 3 positions shown; findings below may reference images not displayed]

FINDINGS: The lumbar vertebral bodies are preserved in height. The twelfth
ribs are small. The lumbar disc space heights are well maintained.
There is no spondylolisthesis. There is no perched facet. The
presacral soft tissues are normal. This sacrum itself exhibits no
acute abnormality. An IUD is present.
IMPRESSION: There is no acute or significant chronic bony abnormality of the
lumbar spine.

## 2019-04-02 ENCOUNTER — Encounter: Payer: Self-pay | Admitting: Gastroenterology

## 2019-04-04 ENCOUNTER — Other Ambulatory Visit: Payer: Self-pay | Admitting: Physician Assistant

## 2019-05-06 ENCOUNTER — Ambulatory Visit: Payer: Self-pay

## 2019-05-06 NOTE — Telephone Encounter (Signed)
Pt. called to report onset of multiple symptoms, since last Wednesday, 4/29.  C/o occasional dry cough, extreme fatigue, headache, body aches, ear pressure, and post nasal drip.  Denied shortness of breath, chest tightness, or fever.  Stated her worst symptom is the extreme fatigue.  Having difficulty staying awake, and reported no improvement after she sleeps.  Stated temp. 98-99 degrees.  Reported her husband is a Therapist, art at [employer], and although he has not had any symptoms, has been exposed to his partner, and several inmates, that were positive for COVID 19; reported over 200 cases.  She compared her level of exhaustion, to when her thyroid is off, but stated does not have the mental symptoms she typically has, with irregular thyroid.  Also reported she has an "anaphylactic level of allergic reaction to rubbing alcohol", and noted that she has had exposure to this product more, since the COVID 19 Pandemic outbreak.  Called FC; transferred pt. to the office for an appt. to be scheduled.  Pt. agrees with plan.    Reason for Disposition . [1] COVID-19 infection diagnosed or suspected AND [2] mild symptoms (fever, cough) AND [3] no trouble breathing or other complications    C/o extreme fatigue, occas. Cough, headache, body aches, pressure in ears, and post nasal drip x 1 week.  Denied fever (98-99 degrees), shortness of breath, or chest discomfort.  Husband works in [employer], as a Therapist, art, and has not been ill, but has had exposure to multiple prisoners with COVID 19. (over 200 cases)  Answer Assessment - Initial Assessment Questions 1. COVID-19 DIAGNOSIS: "Who made your Coronavirus (COVID-19) diagnosis?" "Was it confirmed by a positive lab test?" If not diagnosed by a HCP, ask "Are there lots of cases (community spread) where you live?" (See public health department website, if unsure)   * MAJOR community spread: high number of cases; numbers of cases  are increasing; many people hospitalized.   * MINOR community spread: low number of cases; not increasing; few or no people hospitalized     Present in the community 2. ONSET: "When did the COVID-19 symptoms start?"      Last Wednesday; worsened on Sunday 3. WORST SYMPTOM: "What is your worst symptom?" (e.g., cough, fever, shortness of breath, muscle aches)     Extreme fatigue, but also c/o cough, headache, body aches, earache, PND; no runny nose 4. COUGH: "Do you have a cough?" If so, ask: "How bad is the cough?"       Occasional dry cough 5. FEVER: "Do you have a fever?" If so, ask: "What is your temperature, how was it measured, and when did it start?"     Monitoring temp.; 98-99 degrees 6. RESPIRATORY STATUS: "Describe your breathing?" (e.g., shortness of breath, wheezing, unable to speak)      Denied shortness of breath, wheezing or chest tightness 7. BETTER-SAME-WORSE: "Are you getting better, staying the same or getting worse compared to yesterday?"  If getting worse, ask, "In what way?"     Progressively worse slowly, and steadily  8. HIGH RISK DISEASE: "Do you have any chronic medical problems?" (e.g., asthma, heart or lung disease, weak immune system, etc.)     Has an "anaphalactic level" of allergy to rubbing alcohol; feels like her allergies have been flared up with all the rubbing alcohol use  9. PREGNANCY: "Is there any chance you are pregnant?" "When was your last menstrual period?"     LMP: hasn't had menstrual cycle in  7 yrs.; has an IUD and on oral BC for her migraines  10. OTHER SYMPTOMS: "Do you have any other symptoms?"  (e.g., runny nose, headache, sore throat, loss of smell)       Headache, body aches, pressure in ears, cough, fatigue, post nasal drip; no stuffy nose  Protocols used: CORONAVIRUS (COVID-19) DIAGNOSED OR SUSPECTED-A-AH

## 2019-05-07 ENCOUNTER — Encounter: Payer: Self-pay | Admitting: Physician Assistant

## 2019-05-07 ENCOUNTER — Other Ambulatory Visit: Payer: Self-pay

## 2019-05-07 ENCOUNTER — Ambulatory Visit (INDEPENDENT_AMBULATORY_CARE_PROVIDER_SITE_OTHER): Payer: No Typology Code available for payment source | Admitting: Physician Assistant

## 2019-05-07 VITALS — Temp 99.1°F | Ht 64.0 in | Wt 151.4 lb

## 2019-05-07 DIAGNOSIS — R6889 Other general symptoms and signs: Secondary | ICD-10-CM | POA: Diagnosis not present

## 2019-05-07 DIAGNOSIS — Z20822 Contact with and (suspected) exposure to covid-19: Secondary | ICD-10-CM

## 2019-05-07 NOTE — Progress Notes (Signed)
I have discussed the procedure for the virtual visit with the patient who has given consent to proceed with assessment and treatment.   Rafeal Skibicki S Maison Kestenbaum, CMA     

## 2019-05-07 NOTE — Patient Instructions (Signed)
Instructions sent to MyChart.    Infection Prevention Recommendations for Individuals Confirmed to have, or Being Evaluated for, 2019 Novel Coronavirus (COVID-19) Infection Who Receive Care at Home  Individuals who are confirmed to have, or are being evaluated for, COVID-19 should follow the prevention steps below until a healthcare provider or local or state health department says they can return to normal activities.  Stay home except to get medical care You should restrict activities outside your home, except for getting medical care. Do not go to work, school, or public areas, and do not use public transportation or taxis.  Call ahead before visiting your doctor Before your medical appointment, call the healthcare provider and tell them that you have, or are being evaluated for, COVID-19 infection. This will help the healthcare provider's office take steps to keep other people from getting infected. Ask your healthcare provider to call the local or state health department.  Monitor your symptoms Seek prompt medical attention if your illness is worsening (e.g., difficulty breathing). Before going to your medical appointment, call the healthcare provider and tell them that you have, or are being evaluated for, COVID-19 infection. Ask your healthcare provider to call the local or state health department.  Wear a facemask You should wear a facemask that covers your nose and mouth when you are in the same room with other people and when you visit a healthcare provider. People who live with or visit you should also wear a facemask while they are in the same room with you.  Separate yourself from other people in your home As much as possible, you should stay in a different room from other people in your home. Also, you should use a separate bathroom, if available.  Avoid sharing household items You should not share dishes, drinking glasses, cups, eating utensils, towels, bedding, or  other items with other people in your home. After using these items, you should wash them thoroughly with soap and water.  Cover your coughs and sneezes Cover your mouth and nose with a tissue when you cough or sneeze, or you can cough or sneeze into your sleeve. Throw used tissues in a lined trash can, and immediately wash your hands with soap and water for at least 20 seconds or use an alcohol-based hand rub.  Wash your [facility] your hands often and thoroughly with soap and water for at least 20 seconds. You can use an alcohol-based hand sanitizer if soap and water are not available and if your hands are not visibly dirty. Avoid touching your eyes, nose, and mouth with unwashed hands.   Prevention Steps for Caregivers and Household Members of Individuals Confirmed to have, or Being Evaluated for, COVID-19 Infection Being Cared for in the Home  If you live with, or provide care at home for, a person confirmed to have, or being evaluated for, COVID-19 infection please follow these guidelines to prevent infection:  Follow healthcare provider's instructions Make sure that you understand and can help the patient follow any healthcare provider instructions for all care.  Provide for the patient's basic needs You should help the patient with basic needs in the home and provide support for getting groceries, prescriptions, and other personal needs.  Monitor the patient's symptoms If they are getting sicker, call his or her medical provider and tell them that the patient has, or is being evaluated for, COVID-19 infection. This will help the healthcare provider's office take steps to keep other people from getting infected. Ask the healthcare  provider to call the local or state health department.  Limit the number of people who have contact with the patient  If possible, have only one caregiver for the patient.  Other household members should stay in another home or place of residence.  If this is not possible, they should stay  in another room, or be separated from the patient as much as possible. Use a separate bathroom, if available.  Restrict visitors who do not have an essential need to be in the home.  Keep older adults, very young children, and other sick people away from the patient Keep older adults, very young children, and those who have compromised immune systems or chronic health conditions away from the patient. This includes people with chronic heart, lung, or kidney conditions, diabetes, and cancer.  Ensure good ventilation Make sure that shared spaces in the home have good air flow, such as from an air conditioner or an opened window, weather permitting.  Wash your hands often  Wash your hands often and thoroughly with soap and water for at least 20 seconds. You can use an alcohol based hand sanitizer if soap and water are not available and if your hands are not visibly dirty.  Avoid touching your eyes, nose, and mouth with unwashed hands.  Use disposable paper towels to dry your hands. If not available, use dedicated cloth towels and replace them when they become wet.  Wear a facemask and gloves  Wear a disposable facemask at all times in the room and gloves when you touch or have contact with the patient's blood, body fluids, and/or secretions or excretions, such as sweat, saliva, sputum, nasal mucus, vomit, urine, or feces.  Ensure the mask fits over your nose and mouth tightly, and do not touch it during use.  Throw out disposable facemasks and gloves after using them. Do not reuse.  Wash your hands immediately after removing your facemask and gloves.  If your personal clothing becomes contaminated, carefully remove clothing and launder. Wash your hands after handling contaminated clothing.  Place all used disposable facemasks, gloves, and other waste in a lined container before disposing them with other household waste.  Remove gloves and wash  your hands immediately after handling these items.  Do not share dishes, glasses, or other household items with the patient  Avoid sharing household items. You should not share dishes, drinking glasses, cups, eating utensils, towels, bedding, or other items with a patient who is confirmed to have, or being evaluated for, COVID-19 infection.  After the person uses these items, you should wash them thoroughly with soap and water.  Wash laundry thoroughly  Immediately remove and wash clothes or bedding that have blood, body fluids, and/or secretions or excretions, such as sweat, saliva, sputum, nasal mucus, vomit, urine, or feces, on them.  Wear gloves when handling laundry from the patient.  Read and follow directions on labels of laundry or clothing items and detergent. In general, wash and dry with the warmest temperatures recommended on the label.  Clean all areas the individual has used often  Clean all touchable surfaces, such as counters, tabletops, doorknobs, bathroom fixtures, toilets, phones, keyboards, tablets, and bedside tables, every day. Also, clean any surfaces that may have blood, body fluids, and/or secretions or excretions on them.  Wear gloves when cleaning surfaces the patient has come in contact with.  Use a diluted bleach solution (e.g., dilute bleach with 1 part bleach and 10 parts water) or a household disinfectant with a label  that says EPA-registered for coronaviruses. To make a bleach solution at home, add 1 tablespoon of bleach to 1 quart (4 cups) of water. For a larger supply, add  cup of bleach to 1 gallon (16 cups) of water.  Read labels of cleaning products and follow recommendations provided on product labels. Labels contain instructions for safe and effective use of the cleaning product including precautions you should take when applying the product, such as wearing gloves or eye protection and making sure you have good ventilation during use of the  product.  Remove gloves and wash hands immediately after cleaning.  Monitor yourself for signs and symptoms of illness Caregivers and household members are considered close contacts, should monitor their health, and will be asked to limit movement outside of the home to the extent possible. Follow the monitoring steps for close contacts listed on the symptom monitoring form.   ? If you have additional questions, contact your local health department or call the epidemiologist on call at [phone] (available 24/7). ? This guidance is subject to change. For the most up-to-date guidance from [facility], please refer to their website: [email or web address]

## 2019-05-07 NOTE — Progress Notes (Signed)
Virtual Visit via Video   I connected with patient on 05/07/19 at  9:20 AM EDT by a video enabled telemedicine application and verified that I am speaking with the correct person using two identifiers.  Location patient: Home Location provider: [facility] Persons participating in the virtual visit: Patient, Provider, [facility] (Patina Moore)  I discussed the limitations of evaluation and management by telemedicine and the availability of in person appointments. The patient expressed understanding and agreed to proceed.  Subjective:   HPI:   Patient presents via [facility] c/o 4.5 days of fatigue, headache, chills, aches and some occasional lightheadedness with exertion. Denies chest pain, chest congestion. Has dry cough and some occasional tightness but this is mild. Husband has been around multiple + prisoners but asymptomatic at present. Denies recent travel. Is working from home.   ROS:   See pertinent positives and negatives per HPI.  Patient Active Problem List   Diagnosis Date Noted  . Allergic reaction 12/29/2018  . Seasonal and perennial allergic rhinitis 12/29/2018  . Allergic conjunctivitis 12/29/2018  . Preventive measure 05/29/2018  . Chronic migraine without aura 04/16/2017  . Hypothyroidism 03/06/2017  . Class 1 obesity without serious comorbidity with body mass index (BMI) of 32.0 to 32.9 in adult 03/06/2017  . Insomnia 09/06/2016    Social History   Tobacco Use  . Smoking status: Never Smoker  . Smokeless tobacco: Never Used  Substance Use Topics  . Alcohol use: No    Alcohol/week: 0.0 standard drinks    Current Outpatient Medications:  .  albuterol (PROAIR HFA) 108 (90 Base) MCG/ACT inhaler, 1-2 puffs every 4-6 hours as needed for cough and wheezing., Disp: 18 g, Rfl: 3 .  budesonide (RHINOCORT AQUA) 32 MCG/ACT nasal spray, Place 2 sprays into both nostrils daily as needed for rhinitis., Disp: 8.6 g, Rfl: 12 .  EPINEPHrine 0.3 mg/0.3 mL IJ  SOAJ injection, , Disp: , Rfl:  .  fluticasone (FLOVENT HFA) 110 MCG/ACT inhaler, Inhale 2 puffs into the lungs 2 (two) times daily as needed., Disp: 1 Inhaler, Rfl: 12 .  ibuprofen (ADVIL,MOTRIN) 200 MG tablet, Take 400 mg by mouth as needed., Disp: , Rfl:  .  levothyroxine (SYNTHROID) 150 MCG tablet, TAKE 1 TABLET (150 MCG TOTAL) BY MOUTH DAILY BEFORE BREAKFAST., Disp: 90 tablet, Rfl: 1 .  montelukast (SINGULAIR) 10 MG tablet, TAKE 1 TABLET BY MOUTH EVERYDAY AT BEDTIME, Disp: 90 tablet, Rfl: 1 .  Norethindrone-Ethinyl Estradiol-Fe Biphas (LO LOESTRIN FE) 1 MG-10 MCG / 10 MCG tablet, TAKE 1 TABLET BY MOUTH DAILY. CONTINUOUS DOSING, Disp: 84 tablet, Rfl: 4 .  nortriptyline (PAMELOR) 10 MG capsule, Take 1 capsule (10 mg total) by mouth at bedtime., Disp: 90 capsule, Rfl: 1 .  zolpidem (AMBIEN) 5 MG tablet, TAKE 1/2 OF A TABLET (2.5 MG TOTAL) BY MOUTH AT BEDTIME AS NEEDED FOR SLEEP., Disp: 15 tablet, Rfl: 1 .  beclomethasone (QVAR REDIHALER) 80 MCG/ACT inhaler, Inhale 2 puffs into the lungs 2 (two) times daily. (Patient not taking: Reported on 05/07/2019), Disp: 1 Inhaler, Rfl: 5 .  levonorgestrel (MIRENA) 20 MCG/24HR IUD, 1 Intra Uterine Device (1 each total) by Intrauterine route once. Placed 04/09/12, Disp: 1 each, Rfl: 0  Allergies  Allergen Reactions  . Flonase [Fluticasone Propionate] Anaphylaxis    Has alcohol in nasal spray  . Latex Anaphylaxis  . Rubbing Alcohol [Alcohol] Anaphylaxis  . Topamax [Topiramate] Other (See Comments)    HI/SI  . Bean Pod Extract Swelling    Butter  Bean, Baked Bean: Facial Swelling  . Hydrocodone     Suicidal thoughts with medication  . Penicillins   . Cephalexin Rash    Objective:   Temp 99.1 F (37.3 C) (Oral)   Ht 5\' 4"  (1.626 m)   Wt 151 lb 6.4 oz (68.7 kg)   BMI 25.99 kg/m   Patient is well-developed, well-nourished in no acute distress.  Resting comfortably in chair at home.  Head is normocephalic, atraumatic.  No labored breathing.  Speech  is clear and coherent with logical contest.  Patient is alert and oriented at baseline.    Assessment and Plan:   1. Suspected Covid-19 Virus Infection Mild without respiratory distress but with + exposure. Patient placed in [place] monitoring program so she can be assessed daily. Supportive measures and OTC medications reviewed. She is to quarantine as directed. ER precautions reviewed with patient.  Leeanne Rio, PA-C 05/07/2019

## 2019-08-05 ENCOUNTER — Other Ambulatory Visit: Payer: Self-pay | Admitting: Physician Assistant

## 2019-08-05 DIAGNOSIS — G43709 Chronic migraine without aura, not intractable, without status migrainosus: Secondary | ICD-10-CM

## 2019-09-10 ENCOUNTER — Other Ambulatory Visit: Payer: Self-pay | Admitting: Family Medicine

## 2019-09-10 DIAGNOSIS — J45909 Unspecified asthma, uncomplicated: Secondary | ICD-10-CM

## 2019-09-24 ENCOUNTER — Other Ambulatory Visit: Payer: Self-pay | Admitting: Physician Assistant

## 2019-10-26 ENCOUNTER — Other Ambulatory Visit: Payer: Self-pay

## 2019-10-26 ENCOUNTER — Other Ambulatory Visit: Payer: Self-pay | Admitting: Obstetrics and Gynecology

## 2019-10-26 ENCOUNTER — Other Ambulatory Visit: Payer: Self-pay | Admitting: Physician Assistant

## 2019-10-26 DIAGNOSIS — G43839 Menstrual migraine, intractable, without status migrainosus: Secondary | ICD-10-CM

## 2019-10-26 MED ORDER — LO LOESTRIN FE 1 MG-10 MCG / 10 MCG PO TABS: ORAL_TABLET | ORAL | 0 refills | Status: DC

## 2019-10-26 NOTE — Telephone Encounter (Signed)
Last refill: 4.6.20 #15, 1 Last OV: 5.8.20 dx. Suspected COVID

## 2019-11-17 ENCOUNTER — Encounter: Payer: Self-pay | Admitting: Obstetrics and Gynecology

## 2019-11-17 ENCOUNTER — Ambulatory Visit (INDEPENDENT_AMBULATORY_CARE_PROVIDER_SITE_OTHER): Payer: No Typology Code available for payment source | Admitting: Obstetrics and Gynecology

## 2019-11-17 ENCOUNTER — Other Ambulatory Visit: Payer: Self-pay

## 2019-11-17 VITALS — BP 100/80 | Ht 63.0 in | Wt 160.0 lb

## 2019-11-17 DIAGNOSIS — G43839 Menstrual migraine, intractable, without status migrainosus: Secondary | ICD-10-CM

## 2019-11-17 DIAGNOSIS — Z23 Encounter for immunization: Secondary | ICD-10-CM | POA: Diagnosis not present

## 2019-11-17 DIAGNOSIS — Z01419 Encounter for gynecological examination (general) (routine) without abnormal findings: Secondary | ICD-10-CM

## 2019-11-17 DIAGNOSIS — Z803 Family history of malignant neoplasm of breast: Secondary | ICD-10-CM

## 2019-11-17 DIAGNOSIS — Z30431 Encounter for routine checking of intrauterine contraceptive device: Secondary | ICD-10-CM

## 2019-11-17 MED ORDER — LO LOESTRIN FE 1 MG-10 MCG / 10 MCG PO TABS: ORAL_TABLET | ORAL | 4 refills | Status: DC

## 2019-11-17 NOTE — Patient Instructions (Signed)
I value your feedback and entrusting us with your care. If you get a [facility] patient survey, I would appreciate you taking the time to let us know about your experience today. Thank you! 

## 2019-11-17 NOTE — Progress Notes (Signed)
PCP:  Brunetta Jeans, PA-C   Chief Complaint  Patient presents with  . Gynecologic Exam  . IUD check     HPI:      Ms. Jacqueline Wilson is a 38 y.o. G1P1001 who LMP was No LMP recorded. (Menstrual status: IUD)., presents today for her annual examination.  Her menses are absent due to IUD and OCPs. She has the IUD for long term contraception and was started on continuous dosing Lo Loestrin for migraine headaches without aura and menstrual migraines with aura by Dr. Cristino Martes. She does not have intermenstrual bleeding.  Neuro put her on nortriptyline nightly last yr due to ice pick headaches. He is fine with her on continuous dosing of OCPs. These 2 meds control her migraines very well.   Sex activity: single partner, contraception - IUD. Mirena placed 04/18/17.  Last Pap: 09/17/17  Results were: no abnormalities /neg HPV DNA  Hx of STDs: none  There is a FH of breast cancer in her MGM. There is no FH of ovarian cancer. Pt is MyRisk neg except PALB2 VUS 2019, IBIS=18.7%.The patient does do self-breast exams.  Tobacco use: The patient denies current or previous tobacco use. Alcohol use: none No drug use.  Exercise: very active  She does get adequate calcium and Vitamin D in her diet. Labs with PCP   Past Medical History:  Diagnosis Date  . BRCA negative 08/2018   MyRisk neg except PALB2 VUS  . Colon polyp 02/2016   colonoscopy; repeat due in 3 yrs  . Family history of breast cancer 08/2018   IBIS=18.7%  . Internal hemorrhoids   . Migraine    without aura and menstrual migraine with aura; sees neuro  . Pre-eclampsia   . Thyroid disease    Thryoidectomy    Past Surgical History:  Procedure Laterality Date  . CESAREAN SECTION    . COLONOSCOPY  03/27/2016   polyp, internal hemorrhoid; repeat in 3 yrs.  Marland Kitchen HEMORRHOID BANDING    . THYROID SURGERY      Family History  Problem Relation Age of Onset  . Heart disease Father   . COPD Father        smoker  . Lung cancer  Father   . Obesity Mother   . Breast cancer Maternal Grandmother 5  . Colon cancer Neg Hx   . Esophageal cancer Neg Hx   . Migraines Neg Hx   . Ovarian cancer Neg Hx     Social History   Socioeconomic History  . Marital status: Married    Spouse name: Not on file  . Number of children: 1  . Years of education: Not on file  . Highest education level: Not on file  Occupational History  . Occupation: Pharmacologist   . Occupation: Transport planner  Social Needs  . Financial resource strain: Not on file  . Food insecurity    Worry: Not on file    Inability: Not on file  . Transportation needs    Medical: Not on file    Non-medical: Not on file  Tobacco Use  . Smoking status: Never Smoker  . Smokeless tobacco: Never Used  Substance and Sexual Activity  . Alcohol use: No    Alcohol/week: 0.0 standard drinks  . Drug use: No  . Sexual activity: Yes    Birth control/protection: I.U.D.    Comment: Mirena  Lifestyle  . Physical activity    Days per week: Not on file    Minutes per session:  Not on file  . Stress: Not on file  Relationships  . Social Herbalist on phone: Not on file    Gets together: Not on file    Attends religious service: Not on file    Active member of club or organization: Not on file    Attends meetings of clubs or organizations: Not on file    Relationship status: Not on file  . Intimate partner violence    Fear of current or ex partner: Not on file    Emotionally abused: Not on file    Physically abused: Not on file    Forced sexual activity: Not on file  Other Topics Concern  . Not on file  Social History Narrative  . Not on file    Current Meds  Medication Sig  . albuterol (PROAIR HFA) 108 (90 Base) MCG/ACT inhaler 1-2 puffs every 4-6 hours as needed for cough and wheezing.  . budesonide (RHINOCORT AQUA) 32 MCG/ACT nasal spray Place 2 sprays into both nostrils daily as needed for rhinitis.  Marland Kitchen EPINEPHrine 0.3 mg/0.3 mL IJ SOAJ injection    . fluticasone (FLOVENT HFA) 110 MCG/ACT inhaler Inhale 2 puffs into the lungs 2 (two) times daily as needed.  Marland Kitchen ibuprofen (ADVIL,MOTRIN) 200 MG tablet Take 400 mg by mouth as needed.  Marland Kitchen levothyroxine (SYNTHROID) 150 MCG tablet TAKE 1 TABLET (150 MCG TOTAL) BY MOUTH DAILY BEFORE BREAKFAST.  . montelukast (SINGULAIR) 10 MG tablet TAKE 1 TABLET BY MOUTH EVERYDAY AT BEDTIME  . Norethindrone-Ethinyl Estradiol-Fe Biphas (LO LOESTRIN FE) 1 MG-10 MCG / 10 MCG tablet TAKE 1 TABLET BY MOUTH DAILY. CONTINUOUS DOSING  . nortriptyline (PAMELOR) 10 MG capsule TAKE 1 CAPSULE (10 MG TOTAL) BY MOUTH AT BEDTIME.  Marland Kitchen zolpidem (AMBIEN) 5 MG tablet TAKE 1/2 OF A TABLET (2.5 MG TOTAL) BY MOUTH AT BEDTIME AS NEEDED FOR SLEEP.  . [DISCONTINUED] beclomethasone (QVAR REDIHALER) 80 MCG/ACT inhaler Inhale 2 puffs into the lungs 2 (two) times daily.  . [DISCONTINUED] Norethindrone-Ethinyl Estradiol-Fe Biphas (LO LOESTRIN FE) 1 MG-10 MCG / 10 MCG tablet TAKE 1 TABLET BY MOUTH DAILY. CONTINUOUS DOSING     ROS:  Review of Systems  Constitutional: Negative for fatigue, fever and unexpected weight change.  Respiratory: Negative for cough, shortness of breath and wheezing.   Cardiovascular: Negative for chest pain, palpitations and leg swelling.  Gastrointestinal: Negative for blood in stool, constipation, diarrhea, nausea and vomiting.  Endocrine: Negative for cold intolerance, heat intolerance and polyuria.  Genitourinary: Negative for dyspareunia, dysuria, flank pain, frequency, genital sores, hematuria, menstrual problem, pelvic pain, urgency, vaginal bleeding, vaginal discharge and vaginal pain.  Musculoskeletal: Negative for back pain, joint swelling and myalgias.  Skin: Negative for rash.  Neurological: Negative for dizziness, syncope, light-headedness, numbness and headaches.  Hematological: Negative for adenopathy.  Psychiatric/Behavioral: Negative for agitation, confusion, sleep disturbance and suicidal ideas. The  patient is not nervous/anxious.      Objective: BP 100/80   Ht '5\' 3"'  (1.6 m)   Wt 160 lb (72.6 kg)   BMI 28.34 kg/m    Physical Exam Constitutional:      Appearance: She is well-developed.  Genitourinary:     Vulva, vagina, uterus, right adnexa and left adnexa normal.     No vulval lesion or tenderness noted.     No vaginal discharge, erythema or tenderness.     No cervical motion tenderness or polyp.     IUD strings visualized.     Uterus is not  enlarged or tender.     No right or left adnexal mass present.     Right adnexa not tender.     Left adnexa not tender.  Neck:     Musculoskeletal: Normal range of motion.     Thyroid: No thyromegaly.  Cardiovascular:     Rate and Rhythm: Normal rate and regular rhythm.     Heart sounds: Normal heart sounds. No murmur.  Pulmonary:     Effort: Pulmonary effort is normal.     Breath sounds: Normal breath sounds.  Chest:     Breasts:        Right: No mass, nipple discharge, skin change or tenderness.        Left: No mass, nipple discharge, skin change or tenderness.  Abdominal:     Palpations: Abdomen is soft.     Tenderness: There is no abdominal tenderness. There is no guarding.  Musculoskeletal: Normal range of motion.  Neurological:     General: No focal deficit present.     Mental Status: She is alert and oriented to person, place, and time.     Cranial Nerves: No cranial nerve deficit.  Skin:    General: Skin is warm and dry.  Psychiatric:        Mood and Affect: Mood normal.        Behavior: Behavior normal.        Thought Content: Thought content normal.        Judgment: Judgment normal.  Vitals signs reviewed.     Assessment/Plan: Encounter for annual routine gynecological examination  Encounter for routine checking of intrauterine contraceptive device (IUD); IUD in place. Good for 6 yrs now.  Family history of breast cancer--Pt is MyRisk neg, IBIS=18.7%. No extra screening recommended. Start mammos age 38.   Intractable menstrual migraine without status migrainosus - Cont OCPs for headaches. F/u prn.  - Plan: Norethindrone-Ethinyl Estradiol-Fe Biphas (LO LOESTRIN FE) 1 MG-10 MCG / 10 MCG tablet  Needs flu shot - Plan: Flu Vaccine QUAD 36+ mos IM (Fluarix, Quad PF)  Meds ordered this encounter  Medications  . Norethindrone-Ethinyl Estradiol-Fe Biphas (LO LOESTRIN FE) 1 MG-10 MCG / 10 MCG tablet    Sig: TAKE 1 TABLET BY MOUTH DAILY. CONTINUOUS DOSING    Dispense:  84 tablet    Refill:  4    Order Specific Question:   Supervising Provider    Answer:   Gae Dry [[id number]]             GYN counsel adequate intake of calcium and vitamin D, diet and exercise     F/U  Return in about 1 year (around 11/16/2020).  Lamon Rotundo B. Alma Muegge, PA-C 11/17/2019 3:22 PM

## 2019-12-09 ENCOUNTER — Telehealth: Payer: No Typology Code available for payment source | Admitting: Physician Assistant

## 2019-12-09 DIAGNOSIS — Z20822 Contact with and (suspected) exposure to covid-19: Secondary | ICD-10-CM

## 2019-12-09 DIAGNOSIS — Z20828 Contact with and (suspected) exposure to other viral communicable diseases: Secondary | ICD-10-CM | POA: Diagnosis not present

## 2019-12-09 DIAGNOSIS — J069 Acute upper respiratory infection, unspecified: Secondary | ICD-10-CM | POA: Diagnosis not present

## 2019-12-09 MED ORDER — ALBUTEROL SULFATE HFA 108 (90 BASE) MCG/ACT IN AERS: 2.0000 | INHALATION_SPRAY | Freq: Four times a day (QID) | RESPIRATORY_TRACT | 0 refills | Status: DC | PRN

## 2019-12-09 MED ORDER — BENZONATATE 100 MG PO CAPS: 100.0000 mg | ORAL_CAPSULE | Freq: Three times a day (TID) | ORAL | 0 refills | Status: DC | PRN

## 2019-12-09 NOTE — Progress Notes (Signed)
E-Visit for Corona Virus Screening   Your current symptoms could be consistent with the coronavirus.  Many health care providers can now test patients at their office but not all are.  [facility] has multiple testing sites. For information on our COVID testing locations and hours go to [email or web address]  Please quarantine yourself while awaiting your test results.  We are enrolling you in our [facility] for [place] . Daily you will receive a questionnaire within the [facility] website. Our COVID 19 response team willl be monitoriing your responses daily. Please continue good preventive care measures, including:  frequent hand-washing, avoid touching your face, cover coughs/sneezes, stay out of crowds and keep a 6 foot distance from others.    COVID-19 is a respiratory illness with symptoms that are similar to the flu. Symptoms are typically mild to moderate, but there have been cases of severe illness and death due to the virus. The following symptoms may appear 2-14 days after exposure: . Fever . Cough . Shortness of breath or difficulty breathing . Chills . Repeated shaking with chills . Muscle pain . Headache . Sore throat . New loss of taste or smell . Fatigue . Congestion or runny nose . Nausea or vomiting . Diarrhea  If you develop fever/cough/breathlessness, please stay home for 10 days with improving symptoms and until you have had 24 hours of no fever (without taking a fever reducer).  Go to the nearest hospital ED for assessment if fever/cough/breathlessness are severe or illness seems like a threat to life.  It is vitally important that if you feel that you have an infection such as this virus or any other virus that you stay home and away from places where you may spread it to others.  You should avoid contact with people age 65 and older.   You should wear a mask or cloth face covering over your nose and mouth if you must be around other  people or animals, including pets (even at home). Try to stay at least 6 feet away from other people. This will protect the people around you.  You can use medication such as A prescription cough medication called Tessalon Perles 100 mg. You may take 1-2 capsules every 8 hours as needed for cough and A prescription inhaler called Albuterol MDI 90 mcg /actuation 2 puffs every 4 hours as needed for shortness of breath, wheezing, cough  You may also take acetaminophen (Tylenol) as needed for fever.   Reduce your risk of any infection by using the same precautions used for avoiding the common cold or flu:  Marland Kitchen Wash your hands often with soap and warm water for at least 20 seconds.  If soap and water are not readily available, use an alcohol-based hand sanitizer with at least 60% alcohol.  . If coughing or sneezing, cover your mouth and nose by coughing or sneezing into the elbow areas of your shirt or coat, into a tissue or into your sleeve (not your hands). . Avoid shaking hands with others and consider head nods or verbal greetings only. . Avoid touching your eyes, nose, or mouth with unwashed hands.  . Avoid close contact with people who are sick. . Avoid places or events with large numbers of people in one location, like concerts or sporting events. . Carefully consider travel plans you have or are making. . If you are planning any travel outside or inside the [place], visit the CDC's Travelers' Health webpage for the latest health notices. . If  you have some symptoms but not all symptoms, continue to monitor at home and seek medical attention if your symptoms worsen. . If you are having a medical emergency, call 911.  HOME CARE . Only take medications as instructed by your medical team. . Drink plenty of fluids and get plenty of rest. . A steam or ultrasonic humidifier can help if you have congestion.   GET HELP RIGHT AWAY IF YOU HAVE EMERGENCY WARNING SIGNS** FOR COVID-19. If you or someone is  showing any of these signs seek emergency medical care immediately. Call 911 or proceed to your closest emergency facility if: . You develop worsening high fever. . Trouble breathing . Bluish lips or face . Persistent pain or pressure in the chest . New confusion . Inability to wake or stay awake . You cough up blood. . Your symptoms become more severe  **This list is not all possible symptoms. Contact your medical provider for any symptoms that are sever or concerning to you.   MAKE SURE YOU   Understand these instructions.  Will watch your condition.  Will get help right away if you are not doing well or get worse.  Your e-visit answers were reviewed by a board certified advanced clinical practitioner to complete your personal care plan.  Depending on the condition, your plan could have included both over the counter or prescription medications.  If there is a problem please reply once you have received a response from your provider.  Your safety is important to [place].  If you have drug allergies check your prescription carefully.    You can use MyChart to ask questions about today's visit, request a non-urgent call back, or ask for a work or school excuse for 24 hours related to this e-Visit. If it has been greater than 24 hours you will need to follow up with your provider, or enter a new e-Visit to address those concerns. You will get an e-mail in the next two days asking about your experience.  I hope that your e-visit has been valuable and will speed your recovery. Thank you for using e-visits.  Particia Nearing PA-C  Approximately 5 minutes was spent documenting and reviewing patient's chart.

## 2020-01-06 ENCOUNTER — Other Ambulatory Visit: Payer: Self-pay | Admitting: Physician Assistant

## 2020-01-06 DIAGNOSIS — J069 Acute upper respiratory infection, unspecified: Secondary | ICD-10-CM

## 2020-01-06 DIAGNOSIS — Z20822 Contact with and (suspected) exposure to covid-19: Secondary | ICD-10-CM

## 2020-01-06 MED ORDER — ALBUTEROL SULFATE HFA 108 (90 BASE) MCG/ACT IN AERS: 2.0000 | INHALATION_SPRAY | Freq: Four times a day (QID) | RESPIRATORY_TRACT | 0 refills | Status: DC | PRN

## 2020-02-01 ENCOUNTER — Other Ambulatory Visit: Payer: Self-pay | Admitting: Physician Assistant

## 2020-02-01 DIAGNOSIS — J069 Acute upper respiratory infection, unspecified: Secondary | ICD-10-CM

## 2020-02-01 DIAGNOSIS — Z20822 Contact with and (suspected) exposure to covid-19: Secondary | ICD-10-CM

## 2020-02-15 ENCOUNTER — Other Ambulatory Visit: Payer: Self-pay | Admitting: Physician Assistant

## 2020-02-15 DIAGNOSIS — G43709 Chronic migraine without aura, not intractable, without status migrainosus: Secondary | ICD-10-CM

## 2020-03-09 ENCOUNTER — Other Ambulatory Visit: Payer: Self-pay | Admitting: Allergy and Immunology

## 2020-03-09 ENCOUNTER — Encounter: Payer: Self-pay | Admitting: Emergency Medicine

## 2020-03-09 ENCOUNTER — Other Ambulatory Visit: Payer: Self-pay | Admitting: Physician Assistant

## 2020-03-09 DIAGNOSIS — J45909 Unspecified asthma, uncomplicated: Secondary | ICD-10-CM

## 2020-03-09 NOTE — Telephone Encounter (Signed)
My chart message sent to patient to schedule a physical

## 2020-03-13 ENCOUNTER — Ambulatory Visit (INDEPENDENT_AMBULATORY_CARE_PROVIDER_SITE_OTHER): Payer: No Typology Code available for payment source | Admitting: Physician Assistant

## 2020-03-13 ENCOUNTER — Other Ambulatory Visit: Payer: Self-pay

## 2020-03-13 ENCOUNTER — Encounter: Payer: Self-pay | Admitting: Physician Assistant

## 2020-03-13 DIAGNOSIS — E039 Hypothyroidism, unspecified: Secondary | ICD-10-CM | POA: Diagnosis not present

## 2020-03-13 DIAGNOSIS — F5101 Primary insomnia: Secondary | ICD-10-CM | POA: Diagnosis not present

## 2020-03-13 DIAGNOSIS — Z0001 Encounter for general adult medical examination with abnormal findings: Secondary | ICD-10-CM | POA: Diagnosis not present

## 2020-03-13 DIAGNOSIS — Z87898 Personal history of other specified conditions: Secondary | ICD-10-CM | POA: Diagnosis not present

## 2020-03-13 DIAGNOSIS — Z0000 Encounter for general adult medical examination without abnormal findings: Secondary | ICD-10-CM

## 2020-03-13 NOTE — Progress Notes (Signed)
I have discussed the procedure for the virtual visit with the patient who has given consent to proceed with assessment and treatment.   Chike Farrington S Saina Waage, CMA     

## 2020-03-13 NOTE — Progress Notes (Signed)
Virtual Visit via Video   I connected with patient on 03/13/20 at  2:00 PM EDT by a video enabled telemedicine application and verified that I am speaking with the correct person using two identifiers.  Location patient: Home Location provider: [facility], Office Persons participating in the virtual visit: Patient, Provider, [place] (Patina Moore)  I discussed the limitations of evaluation and management by telemedicine and the availability of in person appointments. The patient expressed understanding and agreed to proceed.  Subjective:   HPI:   Attempted to connect with patient via doxy [email or web address].  Able to visualize patient and hear her but she is unable to hear me. Completed doxy visit with use of phone for conversation.  Patient presenting today for annual examination.  Does have a couple of issues she would like to discuss.  Endorses taking all of her routine medications as directed and doing well overall.  Patient with longstanding history of insomnia but seems to come in waves.  Notes she has been doing very well cutting back on her Ambien to 2.5 mg nightly as needed in combination with good sleep hygiene practices and occasional OTC melatonin use. Over the past several weeks has noted issue with sleep again despite these measures. Notes any higher doses of the ambien make her unable to function the next morning. Would like to discuss her options.   Patient also endorses longstanding history of motion sickness since childhood. Is able to tolerate cars in most situations but planes are a big issue for her. Notes previously taking bonine otc on flights which would help significantly. The past couple of times she has flown she notes feeling more lethargic and has an issue with wordfinding and just feeling disoriented during flight. Resolves on landing. None of these symptoms at any other time. Is unsure if it is related to the Bonine or she notes she has heard about altitude sickness.    ROS:   See pertinent positives and negatives per HPI.  Patient Active Problem List   Diagnosis Date Noted  . Intractable menstrual migraine without status migrainosus 11/17/2019  . Allergic reaction 12/29/2018  . Seasonal and perennial allergic rhinitis 12/29/2018  . Allergic conjunctivitis 12/29/2018  . Preventive measure 05/29/2018  . Chronic migraine without aura 04/16/2017  . Hypothyroidism 03/06/2017  . Class 1 obesity without serious comorbidity with body mass index (BMI) of 32.0 to 32.9 in adult 03/06/2017  . Insomnia 09/06/2016    Social History   Tobacco Use  . Smoking status: Never Smoker  . Smokeless tobacco: Never Used  Substance Use Topics  . Alcohol use: No    Alcohol/week: 0.0 standard drinks    Current Outpatient Medications:  .  albuterol (VENTOLIN HFA) 108 (90 Base) MCG/ACT inhaler, TAKE 2 PUFFS BY MOUTH EVERY 6 HOURS AS NEEDED FOR WHEEZE OR SHORTNESS OF BREATH, Disp: 18 g, Rfl: 2 .  budesonide (RHINOCORT AQUA) 32 MCG/ACT nasal spray, PLACE 2 SPRAYS INTO BOTH NOSTRILS DAILY AS NEEDED FOR RHINITIS., Disp: 8.6 g, Rfl: 0 .  EPINEPHrine 0.3 mg/0.3 mL IJ SOAJ injection, , Disp: , Rfl:  .  fluticasone (FLOVENT HFA) 110 MCG/ACT inhaler, Inhale 2 puffs into the lungs 2 (two) times daily as needed., Disp: 1 Inhaler, Rfl: 12 .  ibuprofen (ADVIL,MOTRIN) 200 MG tablet, Take 400 mg by mouth as needed., Disp: , Rfl:  .  levonorgestrel (MIRENA) 20 MCG/24HR IUD, 1 Intra Uterine Device (1 each total) by Intrauterine route once. Placed 04/09/12, Disp: 1 each, Rfl: 0 .  levothyroxine (SYNTHROID) 150 MCG tablet, TAKE 1 TABLET BY MOUTH DAILY BEFORE BREAKFAST., Disp: 90 tablet, Rfl: 0 .  montelukast (SINGULAIR) 10 MG tablet, TAKE 1 TABLET BY MOUTH EVERYDAY AT BEDTIME, Disp: 90 tablet, Rfl: 0 .  Norethindrone-Ethinyl Estradiol-Fe Biphas (LO LOESTRIN FE) 1 MG-10 MCG / 10 MCG tablet, TAKE 1 TABLET BY MOUTH DAILY. CONTINUOUS DOSING, Disp: 84 tablet, Rfl: 4 .  nortriptyline (PAMELOR) 10  MG capsule, TAKE 1 CAPSULE (10 MG TOTAL) BY MOUTH AT BEDTIME., Disp: 90 capsule, Rfl: 1 .  zolpidem (AMBIEN) 5 MG tablet, TAKE 1/2 OF A TABLET (2.5 MG TOTAL) BY MOUTH AT BEDTIME AS NEEDED FOR SLEEP., Disp: 15 tablet, Rfl: 1  Allergies  Allergen Reactions  . Flonase [Fluticasone Propionate] Anaphylaxis    Has alcohol in nasal spray  . Hydrocodone-Acetaminophen Other (See Comments)    Suicidal   . Latex Anaphylaxis  . Rubbing Alcohol [Alcohol] Anaphylaxis  . Topamax [Topiramate] Other (See Comments)    HI/SI  . Bean Pod Extract Swelling    Butter Bean, Baked Bean: Facial Swelling  . Hydrocodone     Suicidal thoughts with medication  . Penicillins   . Cephalexin Rash    Objective:   There were no vitals taken for this visit.  Patient is well-developed, well-nourished in no acute distress.  Resting comfortably at home.  Head is normocephalic, atraumatic.  No labored breathing.  Speech is clear and coherent with logical content.  Patient is alert and oriented at baseline.   Assessment and Plan:    1. Visit for preventive health examination Depression screen negative. Health Maintenance reviewed. Preventive schedule discussed and handout given in AVS. Will place order for fasting labs. She would like done at [facility] if possible. Will check to see if this is an option. - CBC with Differential/Platelet; Future - Comprehensive metabolic panel; Future - Lipid panel; Future  2. Hypothyroidism, unspecified type Taking medication as directed. Repeat TSH with other CPE labs. - TSH; Future  3. Primary insomnia Discussed options. For now will attempt trial of increase in her Nortriptyline (uses primarily for hx migraines) to 20 mg nightly to see if sleep improves. If doing well will have her continue 20 mg during her insomniac spells, continuing sleep hygiene. Can reduce back to 10 mg nightly when stressors are lower.   4. Hx of motion sickness Episodes she is  describing seem more likely potentially side effect of the bonine versus something more somatoform. Discussed case with Dr. Birdie Riddle, supervising MD who agrees. We both feel that altitiude sickness should not be of concern as airplanes have oxygen pressurized cabins, which severely limit changes in pressure with ascent/descent. Will have her stop the Bonine and can consider scopolamine patch and sea bands for next flight.     Leeanne Rio, PA-C 03/13/2020

## 2020-03-14 ENCOUNTER — Encounter: Payer: Self-pay | Admitting: Physician Assistant

## 2020-03-14 NOTE — Patient Instructions (Signed)
Instructions sent to MyChart

## 2020-03-24 ENCOUNTER — Ambulatory Visit: Payer: No Typology Code available for payment source | Attending: Internal Medicine

## 2020-03-24 DIAGNOSIS — Z23 Encounter for immunization: Secondary | ICD-10-CM

## 2020-03-24 NOTE — Progress Notes (Signed)
   Covid-19 Vaccination Clinic  Name:  Jacqueline Wilson    MRN: [id number] DOB: [birth year]  03/24/2020  [name] was observed post Covid-19 immunization for 15 minutes without incident. She was provided with Vaccine Information Sheet and instruction to access the V-Safe system.   [name] was instructed to call 911 with any severe reactions post vaccine: Marland Kitchen Difficulty breathing  . Swelling of face and throat  . A fast heartbeat  . A bad rash all over body  . Dizziness and weakness   Immunizations Administered    Name Date Dose VIS Date Route   Moderna COVID-19 Vaccine 03/24/2020  8:09 AM 0.5 mL 11/30/2019 Intramuscular   Manufacturer: Moderna   Lot: [id number]   [place]:[id number]

## 2020-03-27 ENCOUNTER — Encounter: Payer: Self-pay | Admitting: Physician Assistant

## 2020-04-23 ENCOUNTER — Other Ambulatory Visit: Payer: Self-pay | Admitting: Physician Assistant

## 2020-04-24 NOTE — Telephone Encounter (Signed)
Ambien last rx 10/26/19 #15 1 RF Medication is not currently on patient medication list LOV: 03/13/20 CPE

## 2020-04-26 ENCOUNTER — Ambulatory Visit: Payer: No Typology Code available for payment source | Attending: Internal Medicine

## 2020-04-26 DIAGNOSIS — Z23 Encounter for immunization: Secondary | ICD-10-CM

## 2020-04-26 NOTE — Progress Notes (Signed)
   Covid-19 Vaccination Clinic  Name:  Jacqueline Wilson    MRN: [id number] DOB: Jul 26, 1981  04/26/2020  [name] was observed post Covid-19 immunization for 15 minutes without incident. She was provided with Vaccine Information Sheet and instruction to access the V-Safe system.   [name] was instructed to call 911 with any severe reactions post vaccine: Marland Kitchen Difficulty breathing  . Swelling of face and throat  . A fast heartbeat  . A bad rash all over body  . Dizziness and weakness   Immunizations Administered    Name Date Dose VIS Date Route   Moderna COVID-19 Vaccine 04/26/2020  8:21 AM 0.5 mL 11/2019 Intramuscular   Manufacturer: Levan Hurst   Lot: [id number]   [place]: [id number]      Covid-19 Vaccination Clinic  Name:  Jacqueline Wilson    MRN: [id number] DOB: 1981/08/21  04/26/2020  [name] was observed post Covid-19 immunization for 15 minutes without incident. She was provided with Vaccine Information Sheet and instruction to access the V-Safe system.   [name] was instructed to call 911 with any severe reactions post vaccine: Marland Kitchen Difficulty breathing  . Swelling of face and throat  . A fast heartbeat  . A bad rash all over body  . Dizziness and weakness   Immunizations Administered    Name Date Dose VIS Date Route   Moderna COVID-19 Vaccine 04/26/2020  8:21 AM 0.5 mL 11/2019 Intramuscular   Manufacturer: Moderna   Lot: [id number]   [place]:[id number]

## 2020-05-11 ENCOUNTER — Other Ambulatory Visit: Payer: Self-pay | Admitting: Physician Assistant

## 2020-05-11 DIAGNOSIS — Z20822 Contact with and (suspected) exposure to covid-19: Secondary | ICD-10-CM

## 2020-05-11 DIAGNOSIS — J069 Acute upper respiratory infection, unspecified: Secondary | ICD-10-CM

## 2020-05-15 ENCOUNTER — Encounter: Payer: Self-pay | Admitting: Physician Assistant

## 2020-05-16 MED ORDER — NAFTIFINE HCL 2 % EX CREA: TOPICAL_CREAM | CUTANEOUS | 0 refills | Status: DC

## 2020-06-03 ENCOUNTER — Other Ambulatory Visit: Payer: Self-pay | Admitting: Allergy and Immunology

## 2020-06-03 ENCOUNTER — Other Ambulatory Visit: Payer: Self-pay | Admitting: Physician Assistant

## 2020-06-05 NOTE — Telephone Encounter (Signed)
Called patient to inform her that she needed her labs completed. Patient aware and is going to schedule to have them done in [place].

## 2020-06-12 ENCOUNTER — Other Ambulatory Visit: Payer: No Typology Code available for payment source

## 2020-06-14 ENCOUNTER — Other Ambulatory Visit (INDEPENDENT_AMBULATORY_CARE_PROVIDER_SITE_OTHER): Payer: No Typology Code available for payment source

## 2020-06-14 ENCOUNTER — Other Ambulatory Visit: Payer: Self-pay

## 2020-06-14 DIAGNOSIS — E039 Hypothyroidism, unspecified: Secondary | ICD-10-CM

## 2020-06-14 DIAGNOSIS — Z0000 Encounter for general adult medical examination without abnormal findings: Secondary | ICD-10-CM

## 2020-06-15 ENCOUNTER — Encounter: Payer: Self-pay | Admitting: Physician Assistant

## 2020-06-15 LAB — CBC WITH DIFFERENTIAL/PLATELET
Absolute Monocytes: 563 cells/uL (ref 200–950)
Basophils Absolute: 26 cells/uL (ref 0–200)
Basophils Relative: 0.3 %
Eosinophils Absolute: 70 cells/uL (ref 15–500)
Eosinophils Relative: 0.8 %
HCT: 42.5 % (ref 35.0–45.0)
Hemoglobin: 14.6 g/dL (ref 11.7–15.5)
Lymphs Abs: 1874 cells/uL (ref 850–3900)
MCH: 31.1 pg (ref 27.0–33.0)
MCHC: 34.4 g/dL (ref 32.0–36.0)
MCV: 90.4 fL (ref 80.0–100.0)
MPV: 11.3 fL (ref 7.5–12.5)
Monocytes Relative: 6.4 %
Neutro Abs: 6266 cells/uL (ref 1500–7800)
Neutrophils Relative %: 71.2 %
Platelets: 281 10*3/uL (ref 140–400)
RBC: 4.7 10*6/uL (ref 3.80–5.10)
RDW: 12.3 % (ref 11.0–15.0)
Total Lymphocyte: 21.3 %
WBC: 8.8 10*3/uL (ref 3.8–10.8)

## 2020-06-15 LAB — COMPREHENSIVE METABOLIC PANEL
AG Ratio: 1.7 (calc) (ref 1.0–2.5)
ALT: 16 U/L (ref 6–29)
AST: 15 U/L (ref 10–30)
Albumin: 4.4 g/dL (ref 3.6–5.1)
Alkaline phosphatase (APISO): 52 U/L (ref 31–125)
BUN/Creatinine Ratio: 14 (calc) (ref 6–22)
BUN: 20 mg/dL (ref 7–25)
CO2: 23 mmol/L (ref 20–32)
Calcium: 9.1 mg/dL (ref 8.6–10.2)
Chloride: 102 mmol/L (ref 98–110)
Creat: 1.44 mg/dL — ABNORMAL HIGH (ref 0.50–1.10)
Globulin: 2.6 g/dL (calc) (ref 1.9–3.7)
Glucose, Bld: 97 mg/dL (ref 65–99)
Potassium: 4.3 mmol/L (ref 3.5–5.3)
Sodium: 135 mmol/L (ref 135–146)
Total Bilirubin: 0.6 mg/dL (ref 0.2–1.2)
Total Protein: 7 g/dL (ref 6.1–8.1)

## 2020-06-15 LAB — LIPID PANEL
Cholesterol: 220 mg/dL — ABNORMAL HIGH (ref <200)
HDL: 47 mg/dL — ABNORMAL LOW (ref >=50)
LDL Cholesterol (Calc): 133 mg/dL (calc) — ABNORMAL HIGH
Non-HDL Cholesterol (Calc): 173 mg/dL (calc) — ABNORMAL HIGH (ref <130)
Total CHOL/HDL Ratio: 4.7 (calc) (ref <5.0)
Triglycerides: 261 mg/dL — ABNORMAL HIGH (ref <150)

## 2020-06-15 LAB — TSH: TSH: 11.59 mIU/L — ABNORMAL HIGH

## 2020-06-16 MED ORDER — LEVOTHYROXINE SODIUM 175 MCG PO TABS: 175.0000 ug | ORAL_TABLET | Freq: Every day | ORAL | 2 refills | Status: DC

## 2020-07-02 ENCOUNTER — Other Ambulatory Visit: Payer: Self-pay | Admitting: Physician Assistant

## 2020-07-02 DIAGNOSIS — J45909 Unspecified asthma, uncomplicated: Secondary | ICD-10-CM

## 2020-07-25 ENCOUNTER — Encounter: Payer: Self-pay | Admitting: Physician Assistant

## 2020-07-25 DIAGNOSIS — E669 Obesity, unspecified: Secondary | ICD-10-CM

## 2020-07-25 DIAGNOSIS — R748 Abnormal levels of other serum enzymes: Secondary | ICD-10-CM

## 2020-07-25 DIAGNOSIS — E039 Hypothyroidism, unspecified: Secondary | ICD-10-CM

## 2020-07-26 NOTE — Telephone Encounter (Signed)
From her labs on 06/14/20, you wanted to follow up tsh in 6 weeks, recheck kidney function. I am unsure about the cholesterol panel. Levothyroxine was increased to 175 mcg per last labs  Please advise. TSH and CMP lab orders pending

## 2020-07-27 NOTE — Telephone Encounter (Signed)
Also need to add on the fasting lipid panel. Ok to put in orders to [facility] and direct her for scheduling at [facility].

## 2020-07-31 ENCOUNTER — Other Ambulatory Visit (INDEPENDENT_AMBULATORY_CARE_PROVIDER_SITE_OTHER): Payer: No Typology Code available for payment source

## 2020-07-31 ENCOUNTER — Other Ambulatory Visit: Payer: Self-pay

## 2020-07-31 DIAGNOSIS — R748 Abnormal levels of other serum enzymes: Secondary | ICD-10-CM

## 2020-07-31 DIAGNOSIS — Z6832 Body mass index (BMI) 32.0-32.9, adult: Secondary | ICD-10-CM | POA: Diagnosis not present

## 2020-07-31 DIAGNOSIS — E669 Obesity, unspecified: Secondary | ICD-10-CM | POA: Diagnosis not present

## 2020-07-31 DIAGNOSIS — E039 Hypothyroidism, unspecified: Secondary | ICD-10-CM

## 2020-08-01 LAB — COMPREHENSIVE METABOLIC PANEL
AG Ratio: 1.7 (calc) (ref 1.0–2.5)
ALT: 14 U/L (ref 6–29)
AST: 16 U/L (ref 10–30)
Albumin: 4.1 g/dL (ref 3.6–5.1)
Alkaline phosphatase (APISO): 54 U/L (ref 31–125)
BUN: 16 mg/dL (ref 7–25)
CO2: 25 mmol/L (ref 20–32)
Calcium: 9.1 mg/dL (ref 8.6–10.2)
Chloride: 105 mmol/L (ref 98–110)
Creat: 0.95 mg/dL (ref 0.50–1.10)
Globulin: 2.4 g/dL (calc) (ref 1.9–3.7)
Glucose, Bld: 93 mg/dL (ref 65–99)
Potassium: 4.5 mmol/L (ref 3.5–5.3)
Sodium: 137 mmol/L (ref 135–146)
Total Bilirubin: 0.5 mg/dL (ref 0.2–1.2)
Total Protein: 6.5 g/dL (ref 6.1–8.1)

## 2020-08-01 LAB — LIPID PANEL
Cholesterol: 197 mg/dL (ref <200)
HDL: 48 mg/dL — ABNORMAL LOW (ref >=50)
LDL Cholesterol (Calc): 117 mg/dL (calc) — ABNORMAL HIGH
Non-HDL Cholesterol (Calc): 149 mg/dL (calc) — ABNORMAL HIGH (ref <130)
Total CHOL/HDL Ratio: 4.1 (calc) (ref <5.0)
Triglycerides: 196 mg/dL — ABNORMAL HIGH (ref <150)

## 2020-08-01 LAB — TSH: TSH: 2.57 mIU/L

## 2020-08-05 ENCOUNTER — Other Ambulatory Visit: Payer: Self-pay | Admitting: Physician Assistant

## 2020-08-05 DIAGNOSIS — G43709 Chronic migraine without aura, not intractable, without status migrainosus: Secondary | ICD-10-CM

## 2020-09-10 ENCOUNTER — Other Ambulatory Visit: Payer: Self-pay | Admitting: Physician Assistant

## 2020-11-27 ENCOUNTER — Other Ambulatory Visit: Payer: Self-pay

## 2020-11-27 ENCOUNTER — Encounter: Payer: Self-pay | Admitting: Physician Assistant

## 2020-11-27 ENCOUNTER — Ambulatory Visit (INDEPENDENT_AMBULATORY_CARE_PROVIDER_SITE_OTHER): Payer: No Typology Code available for payment source | Admitting: Obstetrics and Gynecology

## 2020-11-27 ENCOUNTER — Encounter: Payer: Self-pay | Admitting: Obstetrics and Gynecology

## 2020-11-27 ENCOUNTER — Other Ambulatory Visit: Payer: Self-pay | Admitting: Emergency Medicine

## 2020-11-27 VITALS — BP 112/80 | Ht 63.0 in | Wt 168.0 lb

## 2020-11-27 DIAGNOSIS — D127 Benign neoplasm of rectosigmoid junction: Secondary | ICD-10-CM

## 2020-11-27 DIAGNOSIS — G43839 Menstrual migraine, intractable, without status migrainosus: Secondary | ICD-10-CM | POA: Diagnosis not present

## 2020-11-27 DIAGNOSIS — Z803 Family history of malignant neoplasm of breast: Secondary | ICD-10-CM

## 2020-11-27 DIAGNOSIS — E039 Hypothyroidism, unspecified: Secondary | ICD-10-CM

## 2020-11-27 DIAGNOSIS — Z23 Encounter for immunization: Secondary | ICD-10-CM | POA: Diagnosis not present

## 2020-11-27 DIAGNOSIS — Z01419 Encounter for gynecological examination (general) (routine) without abnormal findings: Secondary | ICD-10-CM | POA: Diagnosis not present

## 2020-11-27 DIAGNOSIS — Z30431 Encounter for routine checking of intrauterine contraceptive device: Secondary | ICD-10-CM | POA: Diagnosis not present

## 2020-11-27 DIAGNOSIS — Z91018 Allergy to other foods: Secondary | ICD-10-CM

## 2020-11-27 DIAGNOSIS — T7840XA Allergy, unspecified, initial encounter: Secondary | ICD-10-CM

## 2020-11-27 DIAGNOSIS — J3089 Other allergic rhinitis: Secondary | ICD-10-CM

## 2020-11-27 MED ORDER — LO LOESTRIN FE 1 MG-10 MCG / 10 MCG PO TABS: ORAL_TABLET | ORAL | 4 refills | Status: DC

## 2020-11-27 NOTE — Telephone Encounter (Signed)
[facility] for referrals. [facility] for repeat TSH levels -- she will need to contact her insurance to see how frequently they will cover.

## 2020-11-27 NOTE — Patient Instructions (Signed)
I value your feedback and entrusting us with your care. If you get a [facility] patient survey, I would appreciate you taking the time to let us know about your experience today. Thank you!  As of December 09, 2019, your lab results will be released to your MyChart immediately, before I even have a chance to see them. Please give me time to review them and contact you if there are any abnormalities. Thank you for your patience.  

## 2020-11-27 NOTE — Progress Notes (Signed)
PCP:  Brunetta Jeans, PA-C   Chief Complaint  Patient presents with  . Gynecologic Exam  . Injections    flu shot     HPI:      Ms. Jacqueline Wilson is a 39 y.o. G1P1001 who LMP was No LMP recorded. (Menstrual status: IUD)., presents today for her annual examination.  Her menses are absent due to IUD and OCPs. She has the IUD for long term contraception and was started on continuous dosing Lo Loestrin for migraine headaches without aura and menstrual migraines with aura by Dr. Cristino Martes with sx improvement. She does not have intermenstrual bleeding. Has had 3 episodes of cramping with diarrhea, a month apart for past 3 months. Pt plans to f/u with allergy MD since sx worse this yr.  Neuro put her on nortriptyline nightly due to ice pick headaches. He is fine with her on continuous dosing of OCPs. These 2 meds control her migraines very well.   Sex activity: single partner, contraception - IUD. Mirena placed 04/18/17.  Last Pap: 09/17/17  Results were: no abnormalities /neg HPV DNA  Hx of STDs: none  There is a FH of breast cancer in her MGM. There is no FH of ovarian cancer. Pt is MyRisk neg except PALB2 VUS 2019, IBIS=18.7%.The patient does self-breast exams.  Tobacco use: The patient denies current or previous tobacco use. Alcohol use: none No drug use.  Exercise: mod active  Colonoscopy: 2017 with polyp, internal hemorrhoid. Repeat due after 3 yrs. Pt hasn't had f/u yet.  She does get adequate calcium and Vitamin D in her diet. Labs with PCP   Past Medical History:  Diagnosis Date  . BRCA negative 08/2018   MyRisk neg except PALB2 VUS  . Colon polyp 02/2016   colonoscopy; repeat due in 3 yrs  . Family history of breast cancer 08/2018   IBIS=18.7%  . Internal hemorrhoids   . Migraine    without aura and menstrual migraine with aura; sees neuro  . Pre-eclampsia   . Thyroid disease    Thryoidectomy    Past Surgical History:  Procedure Laterality Date  . CESAREAN  SECTION    . COLONOSCOPY  03/27/2016   polyp, internal hemorrhoid; repeat in 3 yrs.  Marland Kitchen HEMORRHOID BANDING    . THYROID SURGERY      Family History  Problem Relation Age of Onset  . Heart disease Father   . COPD Father        smoker  . Lung cancer Father   . Obesity Mother   . Breast cancer Maternal Grandmother 31  . Colon cancer Neg Hx   . Esophageal cancer Neg Hx   . Migraines Neg Hx   . Ovarian cancer Neg Hx     Social History   Socioeconomic History  . Marital status: Married    Spouse name: Not on file  . Number of children: 1  . Years of education: Not on file  . Highest education level: Not on file  Occupational History  . Occupation: Pharmacologist   . Occupation: Therapist  Tobacco Use  . Smoking status: Never Smoker  . Smokeless tobacco: Never Used  Vaping Use  . Vaping Use: Never used  Substance and Sexual Activity  . Alcohol use: No    Alcohol/week: 0.0 standard drinks  . Drug use: No  . Sexual activity: Yes    Birth control/protection: I.U.D., Pill    Comment: Mirena  Other Topics Concern  . Not on file  Social History Narrative  . Not on file   Social Determinants of Health   Financial Resource Strain:   . Difficulty of Paying Living Expenses: Not on file  Food Insecurity:   . Worried About Programme researcher, broadcasting/film/video in the Last Year: Not on file  . Ran Out of Food in the Last Year: Not on file  Transportation Needs:   . Lack of Transportation (Medical): Not on file  . Lack of Transportation (Non-Medical): Not on file  Physical Activity:   . Days of Exercise per Week: Not on file  . Minutes of Exercise per Session: Not on file  Stress:   . Feeling of Stress : Not on file  Social Connections:   . Frequency of Communication with Friends and Family: Not on file  . Frequency of Social Gatherings with Friends and Family: Not on file  . Attends Religious Services: Not on file  . Active Member of Clubs or Organizations: Not on file  . Attends Tax inspector Meetings: Not on file  . Marital Status: Not on file  Intimate Partner Violence:   . Fear of Current or Ex-Partner: Not on file  . Emotionally Abused: Not on file  . Physically Abused: Not on file  . Sexually Abused: Not on file    Current Meds  Medication Sig  . albuterol (VENTOLIN HFA) 108 (90 Base) MCG/ACT inhaler TAKE 2 PUFFS BY MOUTH EVERY 6 HOURS AS NEEDED FOR WHEEZE OR SHORTNESS OF BREATH  . budesonide (RHINOCORT AQUA) 32 MCG/ACT nasal spray PLACE 2 SPRAYS INTO BOTH NOSTRILS DAILY AS NEEDED FOR RHINITIS.  Marland Kitchen EPINEPHrine 0.3 mg/0.3 mL IJ SOAJ injection   . fluticasone (FLOVENT HFA) 110 MCG/ACT inhaler Inhale 2 puffs into the lungs 2 (two) times daily as needed.  . Melatonin 2.5 MG CAPS Take 1 capsule by mouth at bedtime.  . montelukast (SINGULAIR) 10 MG tablet TAKE 1 TABLET BY MOUTH EVERYDAY AT BEDTIME  . Multiple Vitamin (MULTIVITAMIN) tablet Take 1 tablet by mouth daily.  . Norethindrone-Ethinyl Estradiol-Fe Biphas (LO LOESTRIN FE) 1 MG-10 MCG / 10 MCG tablet TAKE 1 TABLET BY MOUTH DAILY. CONTINUOUS DOSING  . nortriptyline (PAMELOR) 10 MG capsule TAKE 1 CAPSULE (10 MG TOTAL) BY MOUTH AT BEDTIME.  . SYNTHROID 175 MCG tablet TAKE 1 TABLET (175 MCG TOTAL) BY MOUTH DAILY BEFORE BREAKFAST.  Marland Kitchen Vitamin D, Cholecalciferol, 25 MCG (1000 UT) CAPS Take 1 capsule by mouth daily.  Marland Kitchen zolpidem (AMBIEN) 5 MG tablet TAKE 1/2 OF A TABLET (2.5 MG TOTAL) BY MOUTH AT BEDTIME AS NEEDED FOR SLEEP.  . [DISCONTINUED] Norethindrone-Ethinyl Estradiol-Fe Biphas (LO LOESTRIN FE) 1 MG-10 MCG / 10 MCG tablet TAKE 1 TABLET BY MOUTH DAILY. CONTINUOUS DOSING     ROS:  Review of Systems  Constitutional: Negative for fatigue, fever and unexpected weight change.  Respiratory: Negative for cough, shortness of breath and wheezing.   Cardiovascular: Negative for chest pain, palpitations and leg swelling.  Gastrointestinal: Negative for blood in stool, constipation, diarrhea, nausea and vomiting.    Endocrine: Negative for cold intolerance, heat intolerance and polyuria.  Genitourinary: Negative for dyspareunia, dysuria, flank pain, frequency, genital sores, hematuria, menstrual problem, pelvic pain, urgency, vaginal bleeding, vaginal discharge and vaginal pain.  Musculoskeletal: Negative for back pain, joint swelling and myalgias.  Skin: Negative for rash.  Neurological: Negative for dizziness, syncope, light-headedness, numbness and headaches.  Hematological: Negative for adenopathy.  Psychiatric/Behavioral: Negative for agitation, confusion, sleep disturbance and suicidal ideas. The patient is not nervous/anxious.  Objective: BP 112/80   Ht _0  (1.6 m)   Wt 168 lb (76.2 kg)   BMI 29.76 kg/m    Physical Exam Constitutional:      Appearance: She is well-developed.  Genitourinary:     Vulva, vagina, uterus, right adnexa and left adnexa normal.     No vulval lesion or tenderness noted.     No vaginal discharge, erythema or tenderness.     No cervical motion tenderness or polyp.     IUD strings visualized.     Uterus is not enlarged or tender.     No right or left adnexal mass present.     Right adnexa not tender.     Left adnexa not tender.  Neck:     Thyroid: No thyromegaly.  Cardiovascular:     Rate and Rhythm: Normal rate and regular rhythm.     Heart sounds: Normal heart sounds. No murmur heard.   Pulmonary:     Effort: Pulmonary effort is normal.     Breath sounds: Normal breath sounds.  Chest:     Breasts:        Right: No mass, nipple discharge, skin change or tenderness.        Left: No mass, nipple discharge, skin change or tenderness.  Abdominal:     Palpations: Abdomen is soft.     Tenderness: There is no abdominal tenderness. There is no guarding.  Musculoskeletal:        General: Normal range of motion.     Cervical back: Normal range of motion.  Neurological:     General: No focal deficit present.     Mental Status: She is alert and  oriented to person, place, and time.     Cranial Nerves: No cranial nerve deficit.  Skin:    General: Skin is warm and dry.  Psychiatric:        Mood and Affect: Mood normal.        Behavior: Behavior normal.        Thought Content: Thought content normal.        Judgment: Judgment normal.  Vitals reviewed.     Assessment/Plan: Encounter for annual routine gynecological examination  Encounter for routine checking of intrauterine contraceptive device (IUD); IUD in place. Placed 5/18--good for 7 yrs now.  Family history of breast cancer--Pt is MyRisk neg, IBIS=18.7%. No extra screening recommended. Start mammos age 18.  Intractable menstrual migraine without status migrainosus - Cont OCPs for headaches. Rx RF eRxd, F/u prn.  - Plan: Norethindrone-Ethinyl Estradiol-Fe Biphas (LO LOESTRIN FE) 1 MG-10 MCG / 10 MCG tablet  Screening for colon cancer--pt to f/u with GI for repeat colonoscopy. Will do ref prn.   Needs flu shot - Plan: Flu Vaccine QUAD 36+ mos IM (Fluarix, Quad PF)  Meds ordered this encounter  Medications  . Norethindrone-Ethinyl Estradiol-Fe Biphas (LO LOESTRIN FE) 1 MG-10 MCG / 10 MCG tablet    Sig: TAKE 1 TABLET BY MOUTH DAILY. CONTINUOUS DOSING    Dispense:  84 tablet    Refill:  4    Order Specific Question:   Supervising Provider    Answer:   Gae Dry [[id number]]             GYN counsel adequate intake of calcium and vitamin D, diet and exercise     F/U  Return in about 1 year (around 11/27/2021).  Kameisha Malicki B. Coryn Mosso, PA-C 11/27/2020 2:42 PM

## 2020-12-21 ENCOUNTER — Other Ambulatory Visit: Payer: Self-pay | Admitting: Physician Assistant

## 2020-12-21 DIAGNOSIS — J45909 Unspecified asthma, uncomplicated: Secondary | ICD-10-CM

## 2020-12-27 ENCOUNTER — Other Ambulatory Visit: Payer: Self-pay

## 2020-12-27 ENCOUNTER — Encounter: Payer: Self-pay | Admitting: Physician Assistant

## 2020-12-27 MED ORDER — ZOLPIDEM TARTRATE 5 MG PO TABS: ORAL_TABLET | ORAL | 2 refills | Status: DC

## 2020-12-27 NOTE — Telephone Encounter (Signed)
Hi Jacqueline Wilson -  Could I please have a refill on my ambien?   I also would like to change my pharmacy to [facility] at [address] [place], [place]  [address].  Thank you [facility] 06/04/20 #15 with 2 refills LOV 03/13/20 NOV none

## 2021-01-08 ENCOUNTER — Encounter: Payer: Self-pay | Admitting: Physician Assistant

## 2021-01-08 NOTE — Addendum Note (Signed)
Addended by: Fritz Pickerel on: 01/08/2021 04:24 PM   Modules accepted: Orders

## 2021-01-08 NOTE — Telephone Encounter (Signed)
Is due for repeat thyroid function. Giving severity of symptoms would recommend we get her in for thyroid panel ASAP.

## 2021-01-09 ENCOUNTER — Other Ambulatory Visit: Payer: Self-pay

## 2021-01-09 ENCOUNTER — Other Ambulatory Visit (INDEPENDENT_AMBULATORY_CARE_PROVIDER_SITE_OTHER): Payer: No Typology Code available for payment source

## 2021-01-09 DIAGNOSIS — E039 Hypothyroidism, unspecified: Secondary | ICD-10-CM | POA: Diagnosis not present

## 2021-01-11 LAB — T3, FREE: T3, Free: 2.7 pg/mL (ref 2.3–4.2)

## 2021-01-11 LAB — TEST AUTHORIZATION

## 2021-01-11 LAB — TSH: TSH: 0.04 mIU/L — ABNORMAL LOW

## 2021-01-11 LAB — T4, FREE: Free T4: 1.7 ng/dL (ref 0.8–1.8)

## 2021-01-15 ENCOUNTER — Telehealth (INDEPENDENT_AMBULATORY_CARE_PROVIDER_SITE_OTHER): Payer: No Typology Code available for payment source | Admitting: Physician Assistant

## 2021-01-15 ENCOUNTER — Other Ambulatory Visit: Payer: Self-pay

## 2021-01-15 ENCOUNTER — Encounter: Payer: Self-pay | Admitting: Physician Assistant

## 2021-01-15 DIAGNOSIS — M791 Myalgia, unspecified site: Secondary | ICD-10-CM | POA: Diagnosis not present

## 2021-01-15 DIAGNOSIS — R5382 Chronic fatigue, unspecified: Secondary | ICD-10-CM | POA: Diagnosis not present

## 2021-01-15 DIAGNOSIS — R42 Dizziness and giddiness: Secondary | ICD-10-CM

## 2021-01-15 NOTE — Progress Notes (Signed)
I have discussed the procedure for the virtual visit with the patient who has given consent to proceed with assessment and treatment.   Layn Kye S Davinity Fanara, CMA     

## 2021-01-15 NOTE — Progress Notes (Signed)
Virtual Visit via Video   I connected with patient on 01/15/21 at  9:00 AM EST by a video enabled telemedicine application and verified that I am speaking with the correct person using two identifiers.  Location patient: Home Location provider: [facility], Office Persons participating in the virtual visit: Patient, Provider, Steele (Patina Moore)  I discussed the limitations of evaluation and management by telemedicine and the availability of in person appointments. The patient expressed understanding and agreed to proceed.  Subjective:   HPI:   Patient presents via [place] today to discuss symptoms present over the past 3 weeks. Notes this started around Christmas -- just felt off. Thought it was being out of her regular routine but once back at work, etc -- continued. Notes exacerbation of insomnia despite taking her Ambien, averaging about 3-4 hours per night. Also noting muscle aches, fatigue. Headaches coming and going, related to sleep. Decreased focus. Some nausea on occasion without vomiting. Has recent TSH which was slightly low but normal Free thyroid hormone levels. Is taking all medications as directed. Denies fever, chills. Denies sick contact.     ROS:   See pertinent positives and negatives per HPI.  Patient Active Problem List   Diagnosis Date Noted  . Intractable menstrual migraine without status migrainosus 11/17/2019  . Allergic reaction 12/29/2018  . Seasonal and perennial allergic rhinitis 12/29/2018  . Preventive measure 05/29/2018  . Chronic migraine without aura 04/16/2017  . Hypothyroidism 03/06/2017  . Class 1 obesity without serious comorbidity with body mass index (BMI) of 32.0 to 32.9 in adult 03/06/2017  . Insomnia 09/06/2016    Social History   Tobacco Use  . Smoking status: Never Smoker  . Smokeless tobacco: Never Used  Substance Use Topics  . Alcohol use: No    Alcohol/week: 0.0 standard drinks    Current Outpatient Medications:   .  albuterol (VENTOLIN HFA) 108 (90 Base) MCG/ACT inhaler, TAKE 2 PUFFS BY MOUTH EVERY 6 HOURS AS NEEDED FOR WHEEZE OR SHORTNESS OF BREATH, Disp: 18 g, Rfl: 2 .  budesonide (RHINOCORT AQUA) 32 MCG/ACT nasal spray, PLACE 2 SPRAYS INTO BOTH NOSTRILS DAILY AS NEEDED FOR RHINITIS., Disp: 8.6 g, Rfl: 0 .  EPINEPHrine 0.3 mg/0.3 mL IJ SOAJ injection, , Disp: , Rfl:  .  fluticasone (FLOVENT HFA) 110 MCG/ACT inhaler, Inhale 2 puffs into the lungs 2 (two) times daily as needed., Disp: 1 Inhaler, Rfl: 12 .  Melatonin 2.5 MG CAPS, Take 1 capsule by mouth at bedtime., Disp: , Rfl:  .  montelukast (SINGULAIR) 10 MG tablet, TAKE 1 TABLET BY MOUTH EVERYDAY AT BEDTIME, Disp: 30 tablet, Rfl: 0 .  Multiple Vitamin (MULTIVITAMIN) tablet, Take 1 tablet by mouth daily., Disp: , Rfl:  .  Norethindrone-Ethinyl Estradiol-Fe Biphas (LO LOESTRIN FE) 1 MG-10 MCG / 10 MCG tablet, TAKE 1 TABLET BY MOUTH DAILY. CONTINUOUS DOSING, Disp: 84 tablet, Rfl: 4 .  nortriptyline (PAMELOR) 10 MG capsule, TAKE 1 CAPSULE (10 MG TOTAL) BY MOUTH AT BEDTIME., Disp: 90 capsule, Rfl: 1 .  SYNTHROID 175 MCG tablet, TAKE 1 TABLET (175 MCG TOTAL) BY MOUTH DAILY BEFORE BREAKFAST., Disp: 90 tablet, Rfl: 1 .  Vitamin D, Cholecalciferol, 25 MCG (1000 UT) CAPS, Take 1 capsule by mouth daily., Disp: , Rfl:  .  zolpidem (AMBIEN) 5 MG tablet, TAKE 1/2 OF A TABLET (2.5 MG TOTAL) BY MOUTH AT BEDTIME AS NEEDED FOR SLEEP., Disp: 15 tablet, Rfl: 2  Allergies  Allergen Reactions  . Flonase [Fluticasone Propionate] Anaphylaxis  Has alcohol in nasal spray  . Hydrocodone-Acetaminophen Other (See Comments)    Suicidal   . Latex Anaphylaxis  . Rubbing Alcohol [Alcohol] Anaphylaxis  . Topamax [Topiramate] Other (See Comments)    HI/SI  . Bean Pod Extract Swelling    Butter Bean, Baked Bean: Facial Swelling  . Hydrocodone     Suicidal thoughts with medication  . Penicillins   . Cephalexin Rash    Objective:   There were no vitals taken for this  visit.  No labored breathing.  Speech is clear and coherent with logical content.  Patient is alert and oriented at baseline.   Assessment and Plan:   1. Myalgia - CBC with Differential/Platelet; Future - Comprehensive metabolic panel; Future - Magnesium; Future - CRP High sensitivity; Future 2. Chronic fatigue - CBC with Differential/Platelet; Future - Comprehensive metabolic panel; Future - B12 and Folate Panel; Future - Vitamin D (25 hydroxy); Future - CRP High sensitivity; Future 3. Episodic lightheadedness - CBC with Differential/Platelet; Future - Comprehensive metabolic panel; Future  3 weeks of episodic symptoms. Recent Free thyroid hormone levels within normal range. Plan for further lab evaluation and in-office examination. Orders placed as noted above. She is to keep hydrated. Rest. Eat a well-balanced diet. Tylenol for pain if needed.   Leeanne Rio, PA-C 01/15/2021

## 2021-01-17 ENCOUNTER — Other Ambulatory Visit: Payer: Self-pay | Admitting: Physician Assistant

## 2021-01-17 DIAGNOSIS — J45909 Unspecified asthma, uncomplicated: Secondary | ICD-10-CM

## 2021-01-19 ENCOUNTER — Ambulatory Visit: Payer: No Typology Code available for payment source | Admitting: Allergy

## 2021-01-19 NOTE — Patient Instructions (Addendum)
Allergic reaction Continue to avoid alcohol If avoidance is not an option take an over the counter antihistamine such as Zyrtec, Allegra, Xyzal, or Claritin prior to exposure and have access to epinephrine auto-injector. Refill of epinephrine auto-injector sent. Continue to wear a mask while in the office environment Look into wearing some form of eye protection such as Stoggles when having to go into the office environment to help protect your eyes.  Asthma Start Flovent 110 mcg 2 puffs twice a day with spacer to help prevent cough and wheeze. Come by the office to pick up the spacer and get a demonstration on how to use. (Flovent is alcohol free) Continue albuterol 2 puffs every 4-6 hours as needed for cough, wheeze, tightness in chest, or shortness of breath. Stop montelukast  Seasonal and perennial allergic rhinitis (tree pollen and cat) Start levocetirizine 5 mg once a day as needed for runny nose Stop Flonase (fluticasone) nasal spray Start Rhinocort AQ 2 sprays each nostril once a day as needed for stuffy nose (this nasal spray is alcohol free) Stop montelukast as above May use saline nasal spray or saline nasal rinse as needed  Please let us know if this treatment plan is not working well for you. Schedule a follow up appointment in 2 weeks with spirometry

## 2021-01-22 ENCOUNTER — Encounter: Payer: Self-pay | Admitting: Family

## 2021-01-22 ENCOUNTER — Other Ambulatory Visit: Payer: Self-pay

## 2021-01-22 ENCOUNTER — Ambulatory Visit (INDEPENDENT_AMBULATORY_CARE_PROVIDER_SITE_OTHER): Payer: No Typology Code available for payment source | Admitting: Family

## 2021-01-22 VITALS — Ht 64.0 in | Wt 159.0 lb

## 2021-01-22 DIAGNOSIS — T7840XD Allergy, unspecified, subsequent encounter: Secondary | ICD-10-CM | POA: Diagnosis not present

## 2021-01-22 DIAGNOSIS — J309 Allergic rhinitis, unspecified: Secondary | ICD-10-CM

## 2021-01-22 DIAGNOSIS — J4530 Mild persistent asthma, uncomplicated: Secondary | ICD-10-CM | POA: Diagnosis not present

## 2021-01-22 DIAGNOSIS — J3089 Other allergic rhinitis: Secondary | ICD-10-CM

## 2021-01-22 DIAGNOSIS — J302 Other seasonal allergic rhinitis: Secondary | ICD-10-CM

## 2021-01-22 MED ORDER — ALBUTEROL SULFATE HFA 108 (90 BASE) MCG/ACT IN AERS
INHALATION_SPRAY | RESPIRATORY_TRACT | 1 refills | Status: AC
Start: 2021-01-22 — End: ?

## 2021-01-22 MED ORDER — LEVOCETIRIZINE DIHYDROCHLORIDE 5 MG PO TABS: 5.0000 mg | ORAL_TABLET | ORAL | 3 refills | Status: DC | PRN

## 2021-01-22 MED ORDER — BUDESONIDE 32 MCG/ACT NA SUSP: 2.0000 | Freq: Every day | NASAL | 3 refills | Status: DC | PRN

## 2021-01-22 MED ORDER — EPINEPHRINE 0.3 MG/0.3ML IJ SOAJ: 0.3000 mg | INTRAMUSCULAR | 0 refills | Status: DC | PRN

## 2021-01-22 MED ORDER — FLOVENT HFA 110 MCG/ACT IN AERO: INHALATION_SPRAY | RESPIRATORY_TRACT | 3 refills | Status: AC

## 2021-01-22 NOTE — Progress Notes (Signed)
RE: Jacqueline Wilson MRN: [id number] DOB: 16-Sep-1981 Date of Telemedicine Visit: 01/22/2021  Referring provider: Brunetta Jeans, PA-C Primary care provider: Brunetta Jeans, PA-C  Chief Complaint: Allergic Reaction (She is allergic to Alcohol only has flares when leaving the house), Allergic Rhinitis  (Has gotten worse while working from home she can't leave the house without having watery eyes, runny nose ), and Breathing Problem (Patient stays she has wheezing, coughing, and shortness of breath while working out. She can get through her run with a mask on but has to use her resue inhaler 2 times a day before and after a workout session )   Telemedicine Follow Up Visit via Telephone: I connected with Jacqueline Wilson for a follow up on 01/22/21 by telephone and verified that I am speaking with the correct person using two identifiers.   I discussed the limitations, risks, security and privacy concerns of performing an evaluation and management service by telephone and the availability of in person appointments. I also discussed with the patient that there may be a patient responsible charge related to this service. The patient expressed understanding and agreed to proceed.  Patient is at home  Provider is at the office.  Visit start time: 8:58 AM Visit end time: 9:39 AM Insurance consent/check in by: Dereck Leep Medical consent and medical assistant/nurse: Diandra D. History of Present Illness: She is a 40 y.o. female, who is being followed for allergic reaction and seasonal and perennial allergic rhinitis. Her previous allergy office visit was on 12/29/2018 with Dr. Verlin Fester.   Allergic reaction to alcohol exposure is reported as moderately controlled.  She reports that when she has at home it is easy to control what she is exposed to.  She reports that the alcohol allergy runs in her family and she has had since she was little.  Her office is asking for her to now come into the office once a month  for meetings.  When she is exposed to colognes, perfumes, and cleaning supplies she will begin to develop coughing, wheezing, eyes itching, watering/swelling of eyes and "water blisters" on eyes.  When she gets the water blisters she will use a cool compress and take Benadryl and within an hour her symptoms will get better.  When she wears a mask at work this will help with her symptoms, but this does not help her eyes.  She is requesting a refill on her EpiPen.  Seasonal and perennial allergic rhinitis is reported as well managed by montelukast and Flonase nasal spray.  She occasionally will use Benadryl as needed for a sleep aid.  She does mention that after she uses Flonase nasal spray at she does have nasal congestion right after use and if she does not use this nasal spray she does not have nasal congestion.  She reports occasional rhinorrhea, nasal congestion with varying degrees, and postnasal drip.  She reports that after her last office visit with Dr. Verlin Fester she was diagnosed by her primary care physician with exercise induced asthma.She works out a lot and runs 5-10 miles a day.  She was given a prescription of albuterol from her primary care physician and that seemed to help.  She reports for the past 6 months her breathing has been worse and she will have to use her albuterol daily 2 puffs before exercising and 2 puffs during exercise.  She reports that this still limits what she does and it can take 3 to 6 days for her to get  over the coughing, wheezing, and pain in her lungs.  She also reports a lot of tightness in her chest-"  Feeling like a girdle is strapped on the upper part of her chest", and shortness of breath. She did not ever pick up the Flovent 110 mcg or Qvar Redihaler 80 mcg due to cost, but reports that this is not an issue now.  Since her last office visit she has not required any trips to the emergency room or urgent care due to breathing problems.  She reports that she does not do  well with systemic steroids and will become" suicidal/homicidal".  If she wears a mask while running this eliminates her symptoms.  She is wondering if there is a mask that is better for her to wear while exercising.  Assessment and Plan: Jacqueline Wilson is a 40 y.o. female with: Patient Instructions  Allergic reaction Continue to avoid alcohol If avoidance is not an option take an over the counter antihistamine such as Zyrtec, Allegra, Xyzal, or Claritin prior to exposure and have access to epinephrine auto-injector. Refill of epinephrine auto-injector sent. Continue to wear a mask while in the office environment Look into wearing some form of eye protection such as Stoggles when having to go into the office environment to help protect your eyes.  Asthma Start Flovent 110 mcg 2 puffs twice a day with spacer to help prevent cough and wheeze. Come by the office to pick up the spacer and get a demonstration on how to use. (Flovent is alcohol free) Continue albuterol 2 puffs every 4-6 hours as needed for cough, wheeze, tightness in chest, or shortness of breath. Stop montelukast  Seasonal and perennial allergic rhinitis (tree pollen and cat) Start levocetirizine 5 mg once a day as needed for runny nose Stop Flonase (fluticasone) nasal spray Start Rhinocort AQ 2 sprays each nostril once a day as needed for stuffy nose (this nasal spray is alcohol free) Stop montelukast as above May use saline nasal spray or saline nasal rinse as needed  Please let us know if this treatment plan is not working well for you. Schedule a follow up appointment in 2 weeks with spirometry   Return in about 2 weeks (around 02/05/2021), or if symptoms worsen or fail to improve.  Meds ordered this encounter  Medications  . albuterol (VENTOLIN HFA) 108 (90 Base) MCG/ACT inhaler    Sig: TAKE 2 PUFFS BY MOUTH EVERY 4-6 HOURS AS NEEDED FOR WHEEZE OR SHORTNESS OF BREATH    Dispense:  18 g    Refill:  1  . EPINEPHrine 0.3 mg/0.3 mL  IJ SOAJ injection    Sig: Inject 0.3 mg into the muscle as needed for anaphylaxis.    Dispense:  1 each    Refill:  0  . fluticasone (FLOVENT HFA) 110 MCG/ACT inhaler    Sig: Inhale 2 puffs twice a day with spacer to help prevent cough and wheeze.    Dispense:  1 each    Refill:  3  . budesonide (RHINOCORT AQUA) 32 MCG/ACT nasal spray    Sig: Place 2 sprays into both nostrils daily as needed for rhinitis.    Dispense:  8.6 g    Refill:  3  . levocetirizine (XYZAL) 5 MG tablet    Sig: Take 1 tablet (5 mg total) by mouth as needed for allergies.    Dispense:  30 tablet    Refill:  3   Lab Orders  No laboratory test(s) ordered today  Diagnostics: None.  Medication List:  Current Outpatient Medications  Medication Sig Dispense Refill  . levocetirizine (XYZAL) 5 MG tablet Take 1 tablet (5 mg total) by mouth as needed for allergies. 30 tablet 3  . Melatonin 2.5 MG CAPS Take 1 capsule by mouth at bedtime.    . montelukast (SINGULAIR) 10 MG tablet TAKE 1 TABLET BY MOUTH EVERYDAY AT BEDTIME 30 tablet 0  . Multiple Vitamin (MULTIVITAMIN) tablet Take 1 tablet by mouth daily.    . Norethindrone-Ethinyl Estradiol-Fe Biphas (LO LOESTRIN FE) 1 MG-10 MCG / 10 MCG tablet TAKE 1 TABLET BY MOUTH DAILY. CONTINUOUS DOSING 84 tablet 4  . nortriptyline (PAMELOR) 10 MG capsule TAKE 1 CAPSULE (10 MG TOTAL) BY MOUTH AT BEDTIME. 90 capsule 1  . SYNTHROID 175 MCG tablet TAKE 1 TABLET (175 MCG TOTAL) BY MOUTH DAILY BEFORE BREAKFAST. 90 tablet 1  . Vitamin D, Cholecalciferol, 25 MCG (1000 UT) CAPS Take 1 capsule by mouth daily.    Marland Kitchen zolpidem (AMBIEN) 5 MG tablet TAKE 1/2 OF A TABLET (2.5 MG TOTAL) BY MOUTH AT BEDTIME AS NEEDED FOR SLEEP. 15 tablet 2  . albuterol (VENTOLIN HFA) 108 (90 Base) MCG/ACT inhaler TAKE 2 PUFFS BY MOUTH EVERY 4-6 HOURS AS NEEDED FOR WHEEZE OR SHORTNESS OF BREATH 18 g 1  . budesonide (RHINOCORT AQUA) 32 MCG/ACT nasal spray Place 2 sprays into both nostrils daily as needed for  rhinitis. 8.6 g 3  . EPINEPHrine 0.3 mg/0.3 mL IJ SOAJ injection Inject 0.3 mg into the muscle as needed for anaphylaxis. 1 each 0  . fluticasone (FLOVENT HFA) 110 MCG/ACT inhaler Inhale 2 puffs twice a day with spacer to help prevent cough and wheeze. 1 each 3   No current facility-administered medications for this visit.   Allergies: Allergies  Allergen Reactions  . Flonase [Fluticasone Propionate] Anaphylaxis    Has alcohol in nasal spray  . Hydrocodone-Acetaminophen Other (See Comments)    Suicidal   . Latex Anaphylaxis  . Rubbing Alcohol [Alcohol] Anaphylaxis  . Topamax [Topiramate] Other (See Comments)    HI/SI  . Bean Pod Extract Swelling    Butter Bean, Baked Bean: Facial Swelling  . Hydrocodone     Suicidal thoughts with medication  . Penicillins   . Cephalexin Rash   I reviewed her past medical history, social history, family history, and environmental history and no significant changes have been reported from previous visit on 12/29/2018.  Review of Systems  Constitutional: Negative for chills and fever.  HENT: Positive for congestion, postnasal drip and rhinorrhea.   Eyes: Positive for itching.  Respiratory: Positive for cough, chest tightness, shortness of breath and wheezing.   Cardiovascular: Negative for chest pain and palpitations.  Gastrointestinal: Negative for abdominal pain.  Genitourinary: Negative for difficulty urinating.  Skin: Negative for rash.  Allergic/Immunologic: Positive for environmental allergies.  Neurological: Positive for headaches.       Reports chronic migraines- sees neurologist   Objective: Physical Exam Not obtained as encounter was done via telephone.   Previous notes and tests were reviewed.  I discussed the assessment and treatment plan with the patient. The patient was provided an opportunity to ask questions and all were answered. The patient agreed with the plan and demonstrated an understanding of the instructions.   The  patient was advised to call back or seek an in-person evaluation if the symptoms worsen or if the condition fails to improve as anticipated.  I provided 41 minutes of non-face-to-face time during this encounter.  It was my pleasure to participate in Jacqueline Wilson care today. Please feel free to contact me with any questions or concerns.   Sincerely,  Althea Charon, FNP

## 2021-01-24 ENCOUNTER — Encounter: Payer: Self-pay | Admitting: Physician Assistant

## 2021-01-24 MED ORDER — ZOLPIDEM TARTRATE 5 MG PO TABS: 2.5000 mg | ORAL_TABLET | Freq: Every day | ORAL | 0 refills | Status: DC

## 2021-01-30 ENCOUNTER — Other Ambulatory Visit (INDEPENDENT_AMBULATORY_CARE_PROVIDER_SITE_OTHER): Payer: No Typology Code available for payment source

## 2021-01-30 ENCOUNTER — Other Ambulatory Visit: Payer: Self-pay

## 2021-01-30 DIAGNOSIS — M791 Myalgia, unspecified site: Secondary | ICD-10-CM

## 2021-01-30 DIAGNOSIS — R5382 Chronic fatigue, unspecified: Secondary | ICD-10-CM

## 2021-01-30 DIAGNOSIS — R42 Dizziness and giddiness: Secondary | ICD-10-CM

## 2021-01-31 LAB — CBC WITH DIFFERENTIAL/PLATELET
Absolute Monocytes: 528 cells/uL (ref 200–950)
Basophils Absolute: 32 cells/uL (ref 0–200)
Basophils Relative: 0.4 %
Eosinophils Absolute: 88 cells/uL (ref 15–500)
Eosinophils Relative: 1.1 %
HCT: 40 % (ref 35.0–45.0)
Hemoglobin: 14 g/dL (ref 11.7–15.5)
Lymphs Abs: 1848 cells/uL (ref 850–3900)
MCH: 30.9 pg (ref 27.0–33.0)
MCHC: 35 g/dL (ref 32.0–36.0)
MCV: 88.3 fL (ref 80.0–100.0)
MPV: 10.8 fL (ref 7.5–12.5)
Monocytes Relative: 6.6 %
Neutro Abs: 5504 cells/uL (ref 1500–7800)
Neutrophils Relative %: 68.8 %
Platelets: 275 10*3/uL (ref 140–400)
RBC: 4.53 10*6/uL (ref 3.80–5.10)
RDW: 12.4 % (ref 11.0–15.0)
Total Lymphocyte: 23.1 %
WBC: 8 10*3/uL (ref 3.8–10.8)

## 2021-01-31 LAB — COMPREHENSIVE METABOLIC PANEL
AG Ratio: 1.6 (calc) (ref 1.0–2.5)
ALT: 38 U/L — ABNORMAL HIGH (ref 6–29)
AST: 28 U/L (ref 10–30)
Albumin: 4 g/dL (ref 3.6–5.1)
Alkaline phosphatase (APISO): 64 U/L (ref 31–125)
BUN: 18 mg/dL (ref 7–25)
CO2: 24 mmol/L (ref 20–32)
Calcium: 9 mg/dL (ref 8.6–10.2)
Chloride: 104 mmol/L (ref 98–110)
Creat: 0.73 mg/dL (ref 0.50–1.10)
Globulin: 2.5 g/dL (calc) (ref 1.9–3.7)
Glucose, Bld: 95 mg/dL (ref 65–99)
Potassium: 4.5 mmol/L (ref 3.5–5.3)
Sodium: 138 mmol/L (ref 135–146)
Total Bilirubin: 0.4 mg/dL (ref 0.2–1.2)
Total Protein: 6.5 g/dL (ref 6.1–8.1)

## 2021-01-31 LAB — HIGH SENSITIVITY CRP: hs-CRP: 3.3 mg/L — ABNORMAL HIGH

## 2021-01-31 LAB — VITAMIN D 25 HYDROXY (VIT D DEFICIENCY, FRACTURES): Vit D, 25-Hydroxy: 43 ng/mL (ref 30–100)

## 2021-01-31 LAB — B12 AND FOLATE PANEL
Folate: 17.2 ng/mL
Vitamin B-12: 522 pg/mL (ref 200–1100)

## 2021-01-31 LAB — MAGNESIUM: Magnesium: 1.9 mg/dL (ref 1.5–2.5)

## 2021-02-02 ENCOUNTER — Other Ambulatory Visit: Payer: Self-pay | Admitting: Family

## 2021-02-02 DIAGNOSIS — R7982 Elevated C-reactive protein (CRP): Secondary | ICD-10-CM

## 2021-02-05 ENCOUNTER — Encounter: Payer: Self-pay | Admitting: Allergy & Immunology

## 2021-02-07 NOTE — Telephone Encounter (Signed)
Please have her schedule an in person visit with a physician. She was supposed to have a 2 week follow up appointment.She has not been seen in person since 2019. Thank you!

## 2021-02-08 NOTE — Telephone Encounter (Signed)
Please have her stop the Xyzal for 5-7 days to help with the post nasal drip. Also make sure that she is using her sinus rinse twice a day. Use the sinus rinse before using her Rhinocort nasal spray. Make sure that she is using the Rhinocort nasal spray 2 sprays each nostril once a day and go over proper technique with her. In the right nostril, point the applicator out toward the right ear. In the left nostril, point the applicator out toward the left ear.Also, she can start plain Mucinex 600 mg-1200 mg mg twice a day as needed to help with drainage. Make sure to drink plenty of fluids. Please have her let us know if she is not getting any better. Thank you!

## 2021-02-08 NOTE — Telephone Encounter (Signed)
Patient is scheduled with Dr. Ernst Bowler on 02/19/21.

## 2021-02-08 NOTE — Telephone Encounter (Signed)
Called pt regarding appointment request. Pt already had an appointment on 02/19/2021 but was told she had to make an appointment before she was able to talk to the NP. Pt was frustrated because her medication was not working and she needed other medication. Can we suggest another medication until her appointment on the 21st?

## 2021-02-09 NOTE — Telephone Encounter (Signed)
She reports that she is going to make a list of all of the medications that she is supposed to be taking and she is feeling overwhelmed by all of her medications.   She was fine in 2021 because she was home and not exposed to [place]. She is going to work on getting this managed.   She changed jobs to one that is not 100% remote. She works for [employer] at [employer]. 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   She is fine with keeping the current appointment. We will get everything together at that time.  Salvatore Marvel, MD [facility]

## 2021-02-11 ENCOUNTER — Other Ambulatory Visit: Payer: Self-pay | Admitting: Physician Assistant

## 2021-02-11 DIAGNOSIS — G43709 Chronic migraine without aura, not intractable, without status migrainosus: Secondary | ICD-10-CM

## 2021-02-19 ENCOUNTER — Ambulatory Visit (INDEPENDENT_AMBULATORY_CARE_PROVIDER_SITE_OTHER): Payer: No Typology Code available for payment source | Admitting: Allergy & Immunology

## 2021-02-19 ENCOUNTER — Other Ambulatory Visit: Payer: Self-pay

## 2021-02-19 VITALS — BP 110/78 | HR 86 | Temp 98.2°F | Resp 18 | Ht 64.0 in | Wt 174.4 lb

## 2021-02-19 DIAGNOSIS — J45990 Exercise induced bronchospasm: Secondary | ICD-10-CM

## 2021-02-19 DIAGNOSIS — J302 Other seasonal allergic rhinitis: Secondary | ICD-10-CM

## 2021-02-19 DIAGNOSIS — T7840XD Allergy, unspecified, subsequent encounter: Secondary | ICD-10-CM | POA: Diagnosis not present

## 2021-02-19 DIAGNOSIS — J3089 Other allergic rhinitis: Secondary | ICD-10-CM | POA: Diagnosis not present
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
## 2021-02-19 NOTE — Patient Instructions (Addendum)
1. Alcohol allergy  - I think the cheapest and easiest thing to do is to try taking Zyrtec (cetirizine) 2-4 tablets daily to suppress any reactions (this would help if this is histamine mediated). - Try alcohol exposure at home. - If this does not work, we will recommend trying Xolair for treatment of idiopathic anaphylaxis.  - Information on that provided today.  2. Return in about 2 weeks (around 03/05/2021).    Please inform us of any Emergency Department visits, hospitalizations, or changes in symptoms. Call us before going to the ED for breathing or allergy symptoms since we might be able to fit you in for a sick visit. Feel free to contact us anytime with any questions, problems, or concerns.  It was a pleasure to meet you today!  Websites that have reliable patient information: 1. American Academy of Asthma, Allergy, and Immunology: www.aaaai.org 2. Food Allergy Research and Education (FARE): foodallergy.org 3. Mothers of Asthmatics: http://www.asthmacommunitynetwork.org 4. American College of Allergy, Asthma, and Immunology: www.acaai.org   COVID-19 Vaccine Information can be found at: ShippingScam.co.uk For questions related to vaccine distribution or appointments, please email [email or web address] or call [phone].   We realize that you might be concerned about having an allergic reaction to the COVID19 vaccines. To help with that concern, WE ARE OFFERING THE COVID19 VACCINES IN OUR OFFICE! Ask the front desk for dates!     "Like" [place] on Facebook and Instagram for our latest updates!      A healthy democracy works best when [employer] participate! Make sure you are registered to vote! If you have moved or changed any of your contact information, you will need to get this updated before voting!  In some cases, you MAY be able to register to vote online: [email or web address]

## 2021-02-20 ENCOUNTER — Encounter: Payer: Self-pay | Admitting: Allergy & Immunology

## 2021-02-20 LAB — TRYPTASE: Tryptase: 6.5 ug/L (ref 2.2–13.2)

## 2021-02-20 MED ORDER — BUDESONIDE 32 MCG/ACT NA SUSP: 2.0000 | Freq: Every day | NASAL | 3 refills | Status: DC | PRN

## 2021-02-21 ENCOUNTER — Other Ambulatory Visit: Payer: Self-pay

## 2021-02-21 ENCOUNTER — Ambulatory Visit (AMBULATORY_SURGERY_CENTER): Payer: No Typology Code available for payment source | Admitting: *Deleted

## 2021-02-21 VITALS — Ht 64.0 in | Wt 174.0 lb

## 2021-02-21 DIAGNOSIS — Z86010 Personal history of colonic polyps: Secondary | ICD-10-CM

## 2021-02-21 MED ORDER — SUTAB 1479-225-188 MG PO TABS: 1.0000 | ORAL_TABLET | ORAL | 0 refills | Status: DC

## 2021-02-21 NOTE — Progress Notes (Signed)
Patient's pre-visit was done today over the phone with the patient due to COVID-19 pandemic. Name,DOB and address verified. Insurance verified. Patient denies any allergies to Eggs and Soy. Patient cried and hard to wake up after last colonoscopy, pt denies problems with anesthesia/sedation. Patient denies taking diet pills or blood thinners. Packet of Prep instructions mailed to patient including a copy of a consent form and pre-procedure patient acknowledgement form (with envelope to mail back to us)-pt is aware. Prep coupon included. Patient understands to call us back with any questions or concerns. The patient is COVID-19 fully vaccinated + booster, per patient. Patient is aware of our care-partner policy and FOADL-25 safety protocol. EMMI education assigned to the patient for the procedure, sent to [place].

## 2021-03-01 ENCOUNTER — Encounter: Payer: Self-pay | Admitting: Gastroenterology

## 2021-03-06 ENCOUNTER — Encounter: Payer: Self-pay | Admitting: Certified Registered Nurse Anesthetist

## 2021-03-07 ENCOUNTER — Ambulatory Visit (AMBULATORY_SURGERY_CENTER): Payer: No Typology Code available for payment source | Admitting: Gastroenterology

## 2021-03-07 ENCOUNTER — Encounter: Payer: Self-pay | Admitting: Gastroenterology

## 2021-03-07 ENCOUNTER — Encounter: Payer: Self-pay | Admitting: Allergy & Immunology

## 2021-03-07 VITALS — BP 106/73 | HR 84 | Temp 98.2°F | Resp 13 | Ht 64.0 in | Wt 174.0 lb

## 2021-03-07 DIAGNOSIS — Z86010 Personal history of colonic polyps: Secondary | ICD-10-CM

## 2021-03-07 HISTORY — PX: COLONOSCOPY: SHX174

## 2021-03-07 MED ORDER — SODIUM CHLORIDE 0.9 % IV SOLN: 500.0000 mL | Freq: Once | INTRAVENOUS | Status: DC

## 2021-03-07 NOTE — Op Note (Signed)
[facility] Patient Name: Jacqueline Wilson Procedure Date: 03/07/2021 9:30 AM MRN: [id number] Endoscopist: Remo Lipps P. Havery Moros , MD Age: 40 Referring MD:  Date of Birth: 1981/12/07 Gender: Female Account #: [id number] Procedure:                Colonoscopy Indications:              High risk colon cancer surveillance: Personal                            history of colonic polyps - adenoma removed 02/2016                            - age 59, sister with colon polyps at a young age Medicines:                Monitored Anesthesia Care Procedure:                Pre-Anesthesia Assessment:                           - Prior to the procedure, a History and Physical                            was performed, and patient medications and                            allergies were reviewed. The patient's tolerance of                            previous anesthesia was also reviewed. The risks                            and benefits of the procedure and the sedation                            options and risks were discussed with the patient.                            All questions were answered, and informed consent                            was obtained. Prior Anticoagulants: The patient has                            taken no previous anticoagulant or antiplatelet                            agents. ASA Grade Assessment: II - A patient with                            mild systemic disease. After reviewing the risks                            and benefits, the patient was deemed in  satisfactory condition to undergo the procedure.                           After obtaining informed consent, the colonoscope                            was passed under direct vision. Throughout the                            procedure, the patient's blood pressure, pulse, and                            oxygen saturations were monitored continuously. The                            Olympus  [id number]-[id number]) Colonoscope was                            introduced through the anus and advanced to the the                            terminal ileum, with identification of the                            appendiceal orifice and IC valve. The colonoscopy                            was performed without difficulty. The patient                            tolerated the procedure well. The quality of the                            bowel preparation was good. The terminal ileum,                            ileocecal valve, appendiceal orifice, and rectum                            were photographed. Scope In: 9:40:13 AM Scope Out: 9:58:41 AM Scope Withdrawal Time: 0 hours 15 minutes 45 seconds  Total Procedure Duration: 0 hours 18 minutes 28 seconds  Findings:                 The perianal and digital rectal examinations were                            normal.                           The terminal ileum appeared normal.                           A single small angiodysplastic lesion was found in  the transverse colon.                           There was scarring in the distal rectum from prior                            hemorrhoid banding. The exam was otherwise without                            abnormality. Complications:            No immediate complications. Estimated blood loss:                            None. Estimated Blood Loss:     Estimated blood loss: none. Impression:               - The examined portion of the ileum was normal.                           - A single colonic angiodysplastic lesion.                           - The examination was otherwise normal.                           - Stigmata of prior hemorrhoid banding                           - No polyps Recommendation:           - Patient has a contact number available for                            emergencies. The signs and symptoms of potential                            delayed  complications were discussed with the                            patient. Return to normal activities tomorrow.                            Written discharge instructions were provided to the                            patient.                           - Resume previous diet.                           - Continue present medications.                           - Given history of adenoma at a young age (31) and  sister with colon polyps, would repeat colonoscopy                            in 5 years for surveillance. Remo Lipps P. Jochebed Bills, MD 03/07/2021 10:03:13 AM This report has been signed electronically.

## 2021-03-07 NOTE — Progress Notes (Signed)
Pt's states no medical or surgical changes since previsit or office visit.  CW - vitals 

## 2021-03-07 NOTE — Progress Notes (Signed)
Report given to PACU, vss 

## 2021-03-07 NOTE — Patient Instructions (Signed)
YOU HAD AN ENDOSCOPIC PROCEDURE TODAY AT THE [facility]:   Refer to the procedure report that was given to you for any specific questions about what was found during the examination.  If the procedure report does not answer your questions, please call your gastroenterologist to clarify.  If you requested that your care partner not be given the details of your procedure findings, then the procedure report has been included in a sealed envelope for you to review at your convenience later.  YOU SHOULD EXPECT: Some feelings of bloating in the abdomen. Passage of more gas than usual.  Walking can help get rid of the air that was put into your GI tract during the procedure and reduce the bloating. If you had a lower endoscopy (such as a colonoscopy or flexible sigmoidoscopy) you may notice spotting of blood in your stool or on the toilet paper. If you underwent a bowel prep for your procedure, you may not have a normal bowel movement for a few days.  Please Note:  You might notice some irritation and congestion in your nose or some drainage.  This is from the oxygen used during your procedure.  There is no need for concern and it should clear up in a day or so.  SYMPTOMS TO REPORT IMMEDIATELY:   Following lower endoscopy (colonoscopy or flexible sigmoidoscopy):  Excessive amounts of blood in the stool  Significant tenderness or worsening of abdominal pains  Swelling of the abdomen that is new, acute  Fever of 100F or higher   Following upper endoscopy (EGD)  Vomiting of blood or coffee ground material  New chest pain or pain under the shoulder blades  Painful or persistently difficult swallowing  New shortness of breath  Fever of 100F or higher  Black, tarry-looking stools  For urgent or emergent issues, a gastroenterologist can be reached at any hour by calling [phone]. Do not use MyChart messaging for urgent concerns.    DIET:  We do recommend a small meal at first, but  then you may proceed to your regular diet.  Drink plenty of fluids but you should avoid alcoholic beverages for 24 hours.  ACTIVITY:  You should plan to take it easy for the rest of today and you should NOT DRIVE or use heavy machinery until tomorrow (because of the sedation medicines used during the test).    FOLLOW UP: Our staff will call the number listed on your records 48-72 hours following your procedure to check on you and address any questions or concerns that you may have regarding the information given to you following your procedure. If we do not reach you, we will leave a message.  We will attempt to reach you two times.  During this call, we will ask if you have developed any symptoms of COVID 19. If you develop any symptoms (ie: fever, flu-like symptoms, shortness of breath, cough etc.) before then, please call [phone].  If you test positive for Covid 19 in the 2 weeks post procedure, please call and report this information to us.    If any biopsies were taken you will be contacted by phone or by letter within the next 1-3 weeks.  Please call us at [phone] if you have not heard about the biopsies in 3 weeks.    SIGNATURES/CONFIDENTIALITY: You and/or your care partner have signed paperwork which will be entered into your electronic medical record.  These signatures attest to the fact that that the information above on   your After Visit Summary has been reviewed and is understood.  Full responsibility of the confidentiality of this discharge information lies with you and/or your care-partner. 

## 2021-03-09 ENCOUNTER — Telehealth: Payer: Self-pay

## 2021-03-09 NOTE — Telephone Encounter (Signed)
  Follow up Call-  Call back number 03/07/2021  Post procedure Call Back phone  # [phone]  Permission to leave phone message Yes  Some recent data might be hidden     Patient questions:  Do you have a fever, pain , or abdominal swelling? No. Pain Score  0 *  Have you tolerated food without any problems? Yes.    Have you been able to return to your normal activities? Yes.    Do you have any questions about your discharge instructions: Diet   No. Medications  No. Follow up visit  No.  Do you have questions or concerns about your Care? No.  Actions: * If pain score is 4 or above: No action needed, pain <4.

## 2021-03-19 ENCOUNTER — Other Ambulatory Visit: Payer: Self-pay | Admitting: Physician Assistant

## 2021-03-30 ENCOUNTER — Telehealth: Payer: Self-pay | Admitting: Physician Assistant

## 2021-03-30 ENCOUNTER — Other Ambulatory Visit: Payer: Self-pay

## 2021-03-30 DIAGNOSIS — E039 Hypothyroidism, unspecified: Secondary | ICD-10-CM

## 2021-03-30 MED ORDER — LEVOTHYROXINE SODIUM 175 MCG PO TABS: ORAL_TABLET | ORAL | 0 refills | Status: DC

## 2021-03-30 NOTE — Telephone Encounter (Signed)
Filled and sent Rx to patient pharmacy. Called and notified patient.

## 2021-03-30 NOTE — Telephone Encounter (Signed)
Medication Refills  Last OV:  Medication:  Synthroid 175 mcg Pharmacy:  [facility] on [address] in [place], [place]  Let patient know to contact pharmacy at the end of the day to make sure medication is ready.   Please notify patient to allow 48-72 hours to process.  Encourage patient to contact the pharmacy for refills or they can request refills through [facility] [place] out below:   Last refill:  QTY:  Refill Date:    Other Comments:  Patient is completely out and has not had her medication in 3 days.  She is going to make an appointment with a pcp in [place] - She will see him on 04/12/2021   Okay for refill?  Please advise.

## 2021-04-12 ENCOUNTER — Other Ambulatory Visit: Payer: Self-pay

## 2021-04-12 ENCOUNTER — Ambulatory Visit: Payer: No Typology Code available for payment source | Admitting: Internal Medicine

## 2021-04-12 ENCOUNTER — Encounter: Payer: Self-pay | Admitting: Internal Medicine

## 2021-04-12 DIAGNOSIS — J3089 Other allergic rhinitis: Secondary | ICD-10-CM

## 2021-04-12 DIAGNOSIS — Z6832 Body mass index (BMI) 32.0-32.9, adult: Secondary | ICD-10-CM | POA: Diagnosis not present

## 2021-04-12 DIAGNOSIS — E669 Obesity, unspecified: Secondary | ICD-10-CM | POA: Diagnosis not present

## 2021-04-12 DIAGNOSIS — E039 Hypothyroidism, unspecified: Secondary | ICD-10-CM | POA: Diagnosis not present

## 2021-04-12 NOTE — Assessment & Plan Note (Signed)
Patient was advised to take the Zantac as directed

## 2021-04-12 NOTE — Progress Notes (Signed)
New Patient Office Visit  Subjective:  Patient ID: Jacqueline Wilson, female    DOB: November 25, 1981  Age: 40 y.o. MRN: [id number]  CC:  Chief Complaint  Patient presents with  . New Patient (Initial Visit)    HPI Patient presents for new pt  Check up  Past Medical History:  Diagnosis Date  . Allergy   . BRCA negative 08/2018   MyRisk neg except PALB2 VUS  . Colon polyp 02/2016   colonoscopy; repeat due in 3 yrs  . Family history of breast cancer 08/2018   IBIS=18.7%  . Internal hemorrhoids   . Migraine    without aura and menstrual migraine with aura; sees neuro  . Pre-eclampsia   . Thyroid disease    Thryoidectomy     Current Outpatient Medications:  .  albuterol (VENTOLIN HFA) 108 (90 Base) MCG/ACT inhaler, TAKE 2 PUFFS BY MOUTH EVERY 4-6 HOURS AS NEEDED FOR WHEEZE OR SHORTNESS OF BREATH, Disp: 18 g, Rfl: 1 .  budesonide (RHINOCORT AQUA) 32 MCG/ACT nasal spray, Place 2 sprays into both nostrils daily as needed for rhinitis., Disp: 8.6 g, Rfl: 3 .  cetirizine (ZYRTEC) 10 MG tablet, Take 10 mg by mouth daily., Disp: , Rfl:  .  EPINEPHrine 0.3 mg/0.3 mL IJ SOAJ injection, Inject 0.3 mg into the muscle as needed for anaphylaxis., Disp: 1 each, Rfl: 0 .  fluticasone (FLOVENT HFA) 110 MCG/ACT inhaler, Inhale 2 puffs twice a day with spacer to help prevent cough and wheeze., Disp: 1 each, Rfl: 3 .  ibuprofen (ADVIL) 200 MG tablet, Take 200 mg by mouth every 6 (six) hours as needed., Disp: , Rfl:  .  levothyroxine (SYNTHROID) 175 MCG tablet, TAKE 1 TABLET (175 MCG TOTAL) BY MOUTH DAILY BEFORE BREAKFAST., Disp: 90 tablet, Rfl: 0 .  Melatonin 2.5 MG CAPS, Take 1 capsule by mouth at bedtime., Disp: , Rfl:  .  Multiple Vitamin (MULTIVITAMIN) tablet, Take 1 tablet by mouth daily., Disp: , Rfl:  .  Norethindrone-Ethinyl Estradiol-Fe Biphas (LO LOESTRIN FE) 1 MG-10 MCG / 10 MCG tablet, TAKE 1 TABLET BY MOUTH DAILY. CONTINUOUS DOSING, Disp: 84 tablet, Rfl: 4 .  nortriptyline (PAMELOR) 10 MG  capsule, TAKE 1 CAPSULE (10 MG TOTAL) BY MOUTH AT BEDTIME., Disp: 90 capsule, Rfl: 1 .  Vitamin D, Cholecalciferol, 25 MCG (1000 UT) CAPS, Take 1 capsule by mouth daily., Disp: , Rfl:  .  zolpidem (AMBIEN) 5 MG tablet, Take 0.5-1 tablets (2.5-5 mg total) by mouth at bedtime., Disp: 90 tablet, Rfl: 0   Past Surgical History:  Procedure Laterality Date  . CESAREAN SECTION    . COLONOSCOPY  03/27/2016   polyp, internal hemorrhoid; repeat in 3 yrs.  Marland Kitchen HEMORRHOID BANDING    . POLYPECTOMY    . THYROID SURGERY      Family History  Problem Relation Age of Onset  . Heart disease Father   . COPD Father        smoker  . Lung cancer Father   . Obesity Mother   . Breast cancer Maternal Grandmother 30  . Colon cancer Paternal Uncle   . Esophageal cancer Neg Hx   . Migraines Neg Hx   . Ovarian cancer Neg Hx   . Colon polyps Neg Hx     Social History   Socioeconomic History  . Marital status: Married    Spouse name: Not on file  . Number of children: 1  . Years of education: Not on file  . Highest education  level: Not on file  Occupational History  . Occupation: Pharmacologist   . Occupation: Therapist  Tobacco Use  . Smoking status: Never Smoker  . Smokeless tobacco: Never Used  Vaping Use  . Vaping Use: Never used  Substance and Sexual Activity  . Alcohol use: No    Alcohol/week: 0.0 standard drinks  . Drug use: No  . Sexual activity: Yes    Birth control/protection: I.U.D., Pill    Comment: Mirena  Other Topics Concern  . Not on file  Social History Narrative  . Not on file   Social Determinants of Health   Financial Resource Strain: Not on file  Food Insecurity: Not on file  Transportation Needs: Not on file  Physical Activity: Not on file  Stress: Not on file  Social Connections: Not on file  Intimate Partner Violence: Not on file    ROS Review of Systems  Constitutional: Negative.  Negative for appetite change, diaphoresis and fatigue.  HENT: Negative.   Negative for congestion, nosebleeds, postnasal drip, sinus pain and sneezing.   Eyes: Negative.   Respiratory: Negative.  Negative for choking, chest tightness and shortness of breath.   Cardiovascular: Negative.  Negative for leg swelling.  Gastrointestinal: Negative.  Negative for abdominal pain, anal bleeding and constipation.  Endocrine: Negative.   Genitourinary: Negative.  Negative for flank pain and menstrual problem.  Musculoskeletal: Negative.  Negative for arthralgias.  Skin: Negative.  Negative for rash.  Allergic/Immunologic: Negative.   Neurological: Negative.  Negative for seizures, light-headedness and headaches.  Hematological: Negative.   Psychiatric/Behavioral: Negative.  Negative for dysphoric mood. The patient is not nervous/anxious.   All other systems reviewed and are negative.   Objective:   Today's Vitals: There were no vitals taken for this visit.  Physical Exam on physical examination patient is a well-nourished in no acute distress HEENT examination is normal neck is supple.  Jugular venous pressure is not elevated Thyroid is not enlarged without any goiter or nodules. No lymphadenopathy is noted. Cardiovascular system examination revealed apical impulse to be palpable in the fifth intercostal space first heart sounds normal second heart sounds normal no murmur is audible.  On examination of the chest chest is clear on auscultation resonant on percussion.  Abdominal examination reveals abdomen to be soft nontender there is no hepatosplenomegaly bowel sounds are audible no tenderness rigidity or rebound tenderness noted. There is no calf tenderness or pedal edema. Neurological examination is un remarkable psychiatric examination is unremarkable. Locomotor system examination is unremarkable    Assessment & Plan:   Problem List Items Addressed This Visit      Respiratory   Seasonal and perennial allergic rhinitis    Patient was advised to take the  Zantac as directed        Endocrine   Hypothyroidism - Primary    Stable at the present time        Other   Class 1 obesity without serious comorbidity with body mass index (BMI) of 32.0 to 32.9 in adult    - I encouraged the patient to lose weight.  - I educated them on making healthy dietary choices including eating more fruits and vegetables and less fried foods. - I encouraged the patient to exercise more, and educated on the benefits of exercise including weight loss, diabetes prevention, and hypertension prevention.   Dietary counseling with a registered dietician  Referral to a weight management support group (e.g. Weight Watchers, Overeaters Anonymous)  If your BMI is greater than  29 or you have gained more than 15 pounds you should work on weight loss.  Attend a healthy cooking class          Outpatient Encounter Medications as of 04/12/2021  Medication Sig  . albuterol (VENTOLIN HFA) 108 (90 Base) MCG/ACT inhaler TAKE 2 PUFFS BY MOUTH EVERY 4-6 HOURS AS NEEDED FOR WHEEZE OR SHORTNESS OF BREATH  . budesonide (RHINOCORT AQUA) 32 MCG/ACT nasal spray Place 2 sprays into both nostrils daily as needed for rhinitis.  Marland Kitchen cetirizine (ZYRTEC) 10 MG tablet Take 10 mg by mouth daily.  Marland Kitchen EPINEPHrine 0.3 mg/0.3 mL IJ SOAJ injection Inject 0.3 mg into the muscle as needed for anaphylaxis.  . fluticasone (FLOVENT HFA) 110 MCG/ACT inhaler Inhale 2 puffs twice a day with spacer to help prevent cough and wheeze.  Marland Kitchen ibuprofen (ADVIL) 200 MG tablet Take 200 mg by mouth every 6 (six) hours as needed.  Marland Kitchen levothyroxine (SYNTHROID) 175 MCG tablet TAKE 1 TABLET (175 MCG TOTAL) BY MOUTH DAILY BEFORE BREAKFAST.  . Melatonin 2.5 MG CAPS Take 1 capsule by mouth at bedtime.  . Multiple Vitamin (MULTIVITAMIN) tablet Take 1 tablet by mouth daily.  . Norethindrone-Ethinyl Estradiol-Fe Biphas (LO LOESTRIN FE) 1 MG-10 MCG / 10 MCG tablet TAKE 1 TABLET BY MOUTH DAILY. CONTINUOUS DOSING  . nortriptyline  (PAMELOR) 10 MG capsule TAKE 1 CAPSULE (10 MG TOTAL) BY MOUTH AT BEDTIME.  Marland Kitchen Vitamin D, Cholecalciferol, 25 MCG (1000 UT) CAPS Take 1 capsule by mouth daily.  Marland Kitchen zolpidem (AMBIEN) 5 MG tablet Take 0.5-1 tablets (2.5-5 mg total) by mouth at bedtime.   No facility-administered encounter medications on file as of 04/12/2021.    Follow-up: No follow-ups on file.   Cletis Athens, MD

## 2021-04-12 NOTE — Assessment & Plan Note (Signed)
Stable at the present time. 

## 2021-04-12 NOTE — Assessment & Plan Note (Signed)

## 2021-05-31 ENCOUNTER — Ambulatory Visit
Admission: RE | Admit: 2021-05-31 | Discharge: 2021-05-31 | Disposition: A | Discharge status: Home / Self Care | Payer: No Typology Code available for payment source | Attending: Family Medicine | Admitting: Family Medicine

## 2021-05-31 ENCOUNTER — Encounter: Payer: Self-pay | Admitting: Family Medicine

## 2021-05-31 ENCOUNTER — Ambulatory Visit: Payer: No Typology Code available for payment source | Admitting: Family Medicine

## 2021-05-31 ENCOUNTER — Other Ambulatory Visit: Payer: Self-pay

## 2021-05-31 ENCOUNTER — Ambulatory Visit
Admission: RE | Admit: 2021-05-31 | Discharge: 2021-05-31 | Disposition: A | Discharge status: Home / Self Care | Payer: No Typology Code available for payment source | Source: Ambulatory Visit | Attending: Family Medicine | Admitting: Family Medicine

## 2021-05-31 ENCOUNTER — Other Ambulatory Visit: Payer: Self-pay | Admitting: Family Medicine

## 2021-05-31 VITALS — BP 116/84 | HR 88 | Temp 98.4°F | Ht 64.0 in | Wt 172.6 lb

## 2021-05-31 DIAGNOSIS — Z23 Encounter for immunization: Secondary | ICD-10-CM

## 2021-05-31 DIAGNOSIS — F422 Mixed obsessional thoughts and acts: Secondary | ICD-10-CM | POA: Insufficient documentation

## 2021-05-31 DIAGNOSIS — E039 Hypothyroidism, unspecified: Secondary | ICD-10-CM | POA: Diagnosis not present

## 2021-05-31 DIAGNOSIS — G4700 Insomnia, unspecified: Secondary | ICD-10-CM | POA: Diagnosis not present

## 2021-05-31 DIAGNOSIS — M5441 Lumbago with sciatica, right side: Secondary | ICD-10-CM | POA: Diagnosis present

## 2021-05-31 DIAGNOSIS — G8929 Other chronic pain: Secondary | ICD-10-CM

## 2021-05-31 DIAGNOSIS — M5442 Lumbago with sciatica, left side: Secondary | ICD-10-CM | POA: Diagnosis present

## 2021-05-31 DIAGNOSIS — R635 Abnormal weight gain: Secondary | ICD-10-CM | POA: Insufficient documentation

## 2021-05-31 DIAGNOSIS — E890 Postprocedural hypothyroidism: Secondary | ICD-10-CM | POA: Diagnosis not present

## 2021-05-31 NOTE — Assessment & Plan Note (Signed)
Plan to establish care with endocrinology group for further evaluation and management given serum study pattern and symptomatology.

## 2021-05-31 NOTE — Progress Notes (Signed)
Primary Care / Sports Medicine Office Visit  Patient Information:  Patient ID: Jacqueline Wilson, female DOB: 1981-04-14 Age: 40 y.o. MRN: [id number]   Jacqueline Wilson is a pleasant 40 y.o. female presenting with the following:  Chief Complaint  Patient presents with  . New Patient (Initial Visit)  . Establish Care  . Weight Management    Total thyroidectomy about 10 years ago; twice yearly medication needs adjustment; rapid weight gain as a side effect of medication imbalance; 03/02/2021 gained 10 pounds over a month, now gaining about 1-2 pounds; patient works out 1-2 hours daily and has tried [employer] and other diets with no success; has a specialized scale that detects water, body fat percentage, and muscle, degenerative disc disease with chronic back pain associated    Review of Systems pertinent details above   Patient Active Problem List   Diagnosis Date Noted  . Excessive weight gain 05/31/2021  . History of thyroidectomy 05/31/2021  . Mixed obsessional thoughts and acts 05/31/2021  . Chronic low back pain with bilateral sciatica 05/31/2021  . Intractable menstrual migraine without status migrainosus 11/17/2019  . Allergic reaction 12/29/2018  . Seasonal and perennial allergic rhinitis 12/29/2018  . Preventive measure 05/29/2018  . Chronic migraine without aura 04/16/2017  . Hypothyroidism 03/06/2017  . Class 1 obesity without serious comorbidity with body mass index (BMI) of 32.0 to 32.9 in adult 03/06/2017  . Insomnia 09/06/2016   Past Medical History:  Diagnosis Date  . Allergy   . BRCA negative 08/2018   MyRisk neg except PALB2 VUS  . Colon polyp 02/2016   colonoscopy; repeat due in 3 yrs  . Family history of breast cancer 08/2018   IBIS=18.7%  . Internal hemorrhoids   . Migraine    without aura and menstrual migraine with aura; sees neuro  . Pre-eclampsia   . Thyroid disease    Thryoidectomy   Outpatient Encounter Medications as of 05/31/2021  Medication  Sig  . albuterol (VENTOLIN HFA) 108 (90 Base) MCG/ACT inhaler TAKE 2 PUFFS BY MOUTH EVERY 4-6 HOURS AS NEEDED FOR WHEEZE OR SHORTNESS OF BREATH  . cetirizine (ZYRTEC) 10 MG tablet Take 10 mg by mouth daily.  Marland Kitchen EPINEPHrine 0.3 mg/0.3 mL IJ SOAJ injection Inject 0.3 mg into the muscle as needed for anaphylaxis.  . fluticasone (FLOVENT HFA) 110 MCG/ACT inhaler Inhale 2 puffs twice a day with spacer to help prevent cough and wheeze.  Marland Kitchen ibuprofen (ADVIL) 200 MG tablet Take 400 mg by mouth every 6 (six) hours as needed.  . Inulin (FIBER CHOICE PO) Take 5 capsules by mouth at bedtime.  Marland Kitchen levothyroxine (SYNTHROID) 175 MCG tablet TAKE 1 TABLET (175 MCG TOTAL) BY MOUTH DAILY BEFORE BREAKFAST.  . Melatonin 2.5 MG CAPS Take 1 capsule by mouth at bedtime.  . Multiple Vitamin (MULTIVITAMIN) tablet Take 1 tablet by mouth daily.  . Norethindrone-Ethinyl Estradiol-Fe Biphas (LO LOESTRIN FE) 1 MG-10 MCG / 10 MCG tablet TAKE 1 TABLET BY MOUTH DAILY. CONTINUOUS DOSING  . nortriptyline (PAMELOR) 10 MG capsule TAKE 1 CAPSULE (10 MG TOTAL) BY MOUTH AT BEDTIME.  Marland Kitchen Vitamin D, Cholecalciferol, 25 MCG (1000 UT) CAPS Take 1 capsule by mouth daily.  Marland Kitchen zolpidem (AMBIEN) 5 MG tablet Take 0.5-1 tablets (2.5-5 mg total) by mouth at bedtime.  . budesonide (RHINOCORT AQUA) 32 MCG/ACT nasal spray Place 2 sprays into both nostrils daily as needed for rhinitis. (Patient not taking: Reported on 05/31/2021)   No facility-administered encounter medications on file as  of 05/31/2021.   Past Surgical History:  Procedure Laterality Date  . CESAREAN SECTION    . COLONOSCOPY  03/27/2016   polyp, internal hemorrhoid; repeat in 3 yrs.  Marland Kitchen HEMORRHOID BANDING    . POLYPECTOMY    . THYROIDECTOMY  2010    Vitals:   05/31/21 0809  BP: 116/84  Pulse: 88  Temp: 98.4 F (36.9 C)  SpO2: 97%   Vitals:   05/31/21 0809  Weight: 172 lb 9.6 oz (78.3 kg)  Height: '5\' 4"'  (1.626 m)   Body mass index is 29.63 kg/m.  No results found.    Independent interpretation of notes and tests performed by another provider:   None  Procedures performed:   None  Pertinent History, Exam, Impression, and Recommendations:   Hypothyroidism History of total thyroidectomy in 2010 with recent serum studies showing subclinical hyperthyroidism, prior readings as follows:  Results for Jacqueline, Wilson (MRN [id number]) as of 05/31/2021 10:02  Ref. Range 06/14/2020 14:09 07/31/2020 08:02 01/09/2021 15:27  TSH Latest Units: mIU/L 11.59 (H) 2.57 0.04 (L)   Her examination today reveals no significant presence of recurrent thyroid tissue or nodularity, no lymphadenopathy. Her cardiopulmonary examination is benign and her abdomen is soft with mild RLQ tenderness without rebound and hypoactive bowel sounds noted.  Given her surgical history and serum fluctuations, I have advised her to establish care with a endocrinologist for further evaluation and long-term management. She is amenable to this.  Excessive weight gain Patient has previously had success with weight loss using diet and exercise, record review reveals weight fluctuations over the years (high of 183 lbs - 06/04/2011, low of 146 lbs - 06/01/2018). I have discussed at length the overarching goal of caloric deficit in a meaningful way that does not affect her daily quality of life and to push exercise even at moderate level 5 days of the week. She has been putting quite a bit of effort into this without initial noticeable improvement.  Her vitals today are benign and we have discussed the relationship between sleep and weight loss, psychogenic factors, in addition to dietary and nutrition components. She is amenable to seeking additional information and resources from a clinical specialist in diet/nutrition and a referral was placed today.  In the interim, I have review the above in length and we will follow-up on this at her return in 4 weeks.  History of thyroidectomy Plan to establish care with  endocrinology group for further evaluation and management given serum study pattern and symptomatology.  Mixed obsessional thoughts and acts Patient has self-stated longstanding history of OCD specifically describing presence of both obsessions and compulsions (focused at times on eating patterns). She also describes intrusive thoughts in the same line, these thoughts are to the extent of routinely affecting her ability to fall asleep. Her GAD scoring as subjectively reported are negative. That being said, her overall presentation and components of the GAD in light of her history to raise concern for comorbid or differential diagnosis to contain elements of alternate anxiety disorder diagnosis. OCD would fall into the setting of anxiety disorder.  I have reviewed the same with the patient as well as methods for treatment from a pharmacologic and nonpharmacologic standpoint. She is open to consideration and given her presenting concern over weight, I have encourgaed her to research Bupropion, Fluoxetine, and Duloxetine. The latter can be considered beneficial given her longstanding low back pain.  Insomnia Insomnia that is chronic in nature with ongoing symptomatology. Her stated cause is intrusive  and pervasive thoughts preventing her from sleeping. This has previously been addressed from a symptomatic standpoint with Ambien but she has noted increased hunger motivation which she previously had not - this has been linked to increased Ambien dosing. At this stage I have advised sleep hygiene, link to anxiety disorder, and thyroid pathology. We will address each component separately.  Chronic low back pain with bilateral sciatica Stated issue only, she is amenable to obtaining lumbar spine x-ray films and we will evaluate / manage this issue at her return in 4 weeks.   I provided a total time of 63 minutes including both face-to-face and non-face-to-face time on 05/31/2021 inclusive of time utilized for  medical chart review, information gathering, care coordination with staff, and documentation completion.   Orders & Medications No orders of the defined types were placed in this encounter.  Orders Placed This Encounter  Procedures  . DG Lumbar Spine Complete  . Tdap vaccine greater than or equal to 7yo IM  . Amb ref to Medical Nutrition Therapy-MNT  . Ambulatory referral to Endocrinology     Return in about 4 weeks (around 06/28/2021).     Montel Culver, MD   Primary Care Sports Medicine [place]

## 2021-05-31 NOTE — Patient Instructions (Addendum)
-   Follow-up with nutritionist and endocrinologist - Sleep hygiene as discussed - Can review the following medications: Bupropion (Wellbutrin), Fluoxetine (Prozac), and Duloxetine (Cymbalta) - Obtain x-rays of low back - Follow-up in 4 weeks, contact for questions

## 2021-05-31 NOTE — Assessment & Plan Note (Signed)
Patient has previously had success with weight loss using diet and exercise, record review reveals weight fluctuations over the years (high of 183 lbs - 06/04/2011, low of 146 lbs - 06/01/2018). I have discussed at length the overarching goal of caloric deficit in a meaningful way that does not affect her daily quality of life and to push exercise even at moderate level 5 days of the week. She has been putting quite a bit of effort into this without initial noticeable improvement.  Her vitals today are benign and we have discussed the relationship between sleep and weight loss, psychogenic factors, in addition to dietary and nutrition components. She is amenable to seeking additional information and resources from a clinical specialist in diet/nutrition and a referral was placed today.  In the interim, I have review the above in length and we will follow-up on this at her return in 4 weeks.

## 2021-05-31 NOTE — Assessment & Plan Note (Signed)
Insomnia that is chronic in nature with ongoing symptomatology. Her stated cause is intrusive and pervasive thoughts preventing her from sleeping. This has previously been addressed from a symptomatic standpoint with Ambien but she has noted increased hunger motivation which she previously had not - this has been linked to increased Ambien dosing. At this stage I have advised sleep hygiene, link to anxiety disorder, and thyroid pathology. We will address each component separately.

## 2021-05-31 NOTE — Assessment & Plan Note (Signed)
History of total thyroidectomy in 2010 with recent serum studies showing subclinical hyperthyroidism, prior readings as follows:  Results for Jacqueline Wilson, Jacqueline Wilson (MRN [id number]) as of 05/31/2021 10:02  Ref. Range 06/14/2020 14:09 07/31/2020 08:02 01/09/2021 15:27  TSH Latest Units: mIU/L 11.59 (H) 2.57 0.04 (L)   Her examination today reveals no significant presence of recurrent thyroid tissue or nodularity, no lymphadenopathy. Her cardiopulmonary examination is benign and her abdomen is soft with mild RLQ tenderness without rebound and hypoactive bowel sounds noted.  Given her surgical history and serum fluctuations, I have advised her to establish care with a endocrinologist for further evaluation and long-term management. She is amenable to this.

## 2021-05-31 NOTE — Assessment & Plan Note (Signed)
Patient has self-stated longstanding history of OCD specifically describing presence of both obsessions and compulsions (focused at times on eating patterns). She also describes intrusive thoughts in the same line, these thoughts are to the extent of routinely affecting her ability to fall asleep. Her GAD scoring as subjectively reported are negative. That being said, her overall presentation and components of the GAD in light of her history to raise concern for comorbid or differential diagnosis to contain elements of alternate anxiety disorder diagnosis. OCD would fall into the setting of anxiety disorder.  I have reviewed the same with the patient as well as methods for treatment from a pharmacologic and nonpharmacologic standpoint. She is open to consideration and given her presenting concern over weight, I have encourgaed her to research Bupropion, Fluoxetine, and Duloxetine. The latter can be considered beneficial given her longstanding low back pain.

## 2021-05-31 NOTE — Telephone Encounter (Signed)
Please review.  KP

## 2021-05-31 NOTE — Assessment & Plan Note (Signed)
Stated issue only, she is amenable to obtaining lumbar spine x-ray films and we will evaluate / manage this issue at her return in 4 weeks.

## 2021-06-08 ENCOUNTER — Encounter: Payer: Self-pay | Admitting: Family Medicine

## 2021-06-08 ENCOUNTER — Ambulatory Visit: Payer: No Typology Code available for payment source | Admitting: Family Medicine

## 2021-06-08 ENCOUNTER — Other Ambulatory Visit: Payer: Self-pay

## 2021-06-08 VITALS — BP 102/82 | HR 83 | Temp 98.4°F | Ht 64.0 in | Wt 172.4 lb

## 2021-06-08 DIAGNOSIS — M62838 Other muscle spasm: Secondary | ICD-10-CM | POA: Insufficient documentation

## 2021-06-08 MED ORDER — MELOXICAM 15 MG PO TABS: 15.0000 mg | ORAL_TABLET | Freq: Every day | ORAL | 0 refills | Status: DC

## 2021-06-08 MED ORDER — CYCLOBENZAPRINE HCL 5 MG PO TABS: 5.0000 mg | ORAL_TABLET | Freq: Three times a day (TID) | ORAL | 1 refills | Status: AC | PRN

## 2021-06-08 NOTE — Patient Instructions (Signed)
-   Dose meloxicam once daily with food x2 weeks, can dose as needed for week 3 - Dose nightly cyclobenzaprine 1-2 tablets, side effect and be drowsiness; additional doses can be taken every 8 hours on an as-needed basis for muscle tightness pain - Hold from current workout routine until follow-up - Start home exercises with information provided next week - Provide a status update to our office in 2 weeks Is contact us for questions between now and that

## 2021-06-08 NOTE — Progress Notes (Signed)
Primary Care / Sports Medicine Office Visit  Patient Information:  Patient ID: Jacqueline Wilson, female DOB: 1981-04-27 Age: 40 y.o. MRN: [id number]   Jacqueline Wilson is a pleasant 40 y.o. female presenting with the following:  Chief Complaint  Patient presents with   Neck Pain    Bilateral neck, thoracic back, and shoulder pain; started on left side yesterday mid-afternoon; alternating ibuprofen and Tylenol, using heating pad also; hoarse and extremely fatigued per patient; no known injury, but did exercise yesterday morning at 9:00 AM and did some push-ups    Review of Systems pertinent details above   Patient Active Problem List   Diagnosis Date Noted   Cervical paraspinal muscle spasm 06/08/2021   Excessive weight gain 05/31/2021   History of thyroidectomy 05/31/2021   Mixed obsessional thoughts and acts 05/31/2021   Chronic low back pain with bilateral sciatica 05/31/2021   Intractable menstrual migraine without status migrainosus 11/17/2019   Allergic reaction 12/29/2018   Seasonal and perennial allergic rhinitis 12/29/2018   Preventive measure 05/29/2018   Chronic migraine without aura 04/16/2017   Hypothyroidism 03/06/2017   Class 1 obesity without serious comorbidity with body mass index (BMI) of 32.0 to 32.9 in adult 03/06/2017   Insomnia 09/06/2016   Past Medical History:  Diagnosis Date   Allergy    BRCA negative 08/2018   MyRisk neg except PALB2 VUS   Colon polyp 02/2016   colonoscopy; repeat due in 3 yrs   Family history of breast cancer 08/2018   IBIS=18.7%   Internal hemorrhoids    Migraine    without aura and menstrual migraine with aura; sees neuro   Pre-eclampsia    Thyroid disease    Thryoidectomy   Outpatient Encounter Medications as of 06/08/2021  Medication Sig   albuterol (VENTOLIN HFA) 108 (90 Base) MCG/ACT inhaler TAKE 2 PUFFS BY MOUTH EVERY 4-6 HOURS AS NEEDED FOR WHEEZE OR SHORTNESS OF BREATH   cetirizine (ZYRTEC) 10 MG tablet Take 10 mg by  mouth daily.   cyclobenzaprine (FLEXERIL) 5 MG tablet Take 1-2 tablets (5-10 mg total) by mouth 3 (three) times daily as needed for muscle spasms.   EPINEPHrine 0.3 mg/0.3 mL IJ SOAJ injection Inject 0.3 mg into the muscle as needed for anaphylaxis.   fluticasone (FLOVENT HFA) 110 MCG/ACT inhaler Inhale 2 puffs twice a day with spacer to help prevent cough and wheeze.   ibuprofen (ADVIL) 200 MG tablet Take 400 mg by mouth every 6 (six) hours as needed.   Inulin (FIBER CHOICE PO) Take 5 capsules by mouth at bedtime.   levothyroxine (SYNTHROID) 175 MCG tablet TAKE 1 TABLET (175 MCG TOTAL) BY MOUTH DAILY BEFORE BREAKFAST.   Melatonin 2.5 MG CAPS Take 1 capsule by mouth at bedtime.   meloxicam (MOBIC) 15 MG tablet Take 1 tablet (15 mg total) by mouth daily.   Multiple Vitamin (MULTIVITAMIN) tablet Take 1 tablet by mouth daily.   Norethindrone-Ethinyl Estradiol-Fe Biphas (LO LOESTRIN FE) 1 MG-10 MCG / 10 MCG tablet TAKE 1 TABLET BY MOUTH DAILY. CONTINUOUS DOSING   nortriptyline (PAMELOR) 10 MG capsule TAKE 1 CAPSULE (10 MG TOTAL) BY MOUTH AT BEDTIME.   Vitamin D, Cholecalciferol, 25 MCG (1000 UT) CAPS Take 1 capsule by mouth daily.   zolpidem (AMBIEN) 5 MG tablet Take 0.5-1 tablets (2.5-5 mg total) by mouth at bedtime.   budesonide (RHINOCORT AQUA) 32 MCG/ACT nasal spray Place 2 sprays into both nostrils daily as needed for rhinitis. (Patient not taking: No sig reported)  No facility-administered encounter medications on file as of 06/08/2021.   Past Surgical History:  Procedure Laterality Date   CESAREAN SECTION     COLONOSCOPY  03/27/2016   polyp, internal hemorrhoid; repeat in 3 yrs.   HEMORRHOID BANDING     POLYPECTOMY     THYROIDECTOMY  2010    Vitals:   06/08/21 1129  BP: 102/82  Pulse: 83  Temp: 98.4 F (36.9 C)  SpO2: 98%   Vitals:   06/08/21 1129  Weight: 172 lb 6.4 oz (78.2 kg)  Height: 5' 4" (1.626 m)   Body mass index is 29.59 kg/m.   Independent interpretation of  notes and tests performed by another provider:   None  Procedures performed:   None  Pertinent History, Exam, Impression, and Recommendations:   Cervical paraspinal muscle spasm Patient with history of relative ramp-up in activity with high intensity interval training involving upper body rows etc. presenting with roughly 1 day history of severe neck and shoulder pain with radiation to the mid back.  She denies any upper extremity weakness, no paresthesias, no severe limitations in neck range of motion.  Physical exam reveals limited rightward cervical torsion when compared to contralateral, limited by pain, passive increased to nearly full range of motion again limited by pain, maximal pain with extension, bilateral shoulder exams are benign, Spurling's is negative bilaterally.  Tenderness throughout the upper trapezius and right greater than left rhomboids.  Clinical history and findings are most consistent with significant cervical paraspinal muscle spasm throughout the trapezius rhomboids, I have reviewed the same with the patient and various treatment strategies.  Will initiate a course of scheduled meloxicam, nightly cyclobenzaprine with additional doses being as needed, shutdown from current exercise program and start of home-based cervical spine rehab program next week.  She was advised to contact her office in 2 weeks for any suboptimal progress, at that time plain films to be ordered.  She will return for follow-up as previously scheduled on 06/28/2021.   Orders & Medications Meds ordered this encounter  Medications   meloxicam (MOBIC) 15 MG tablet    Sig: Take 1 tablet (15 mg total) by mouth daily.    Dispense:  30 tablet    Refill:  0   cyclobenzaprine (FLEXERIL) 5 MG tablet    Sig: Take 1-2 tablets (5-10 mg total) by mouth 3 (three) times daily as needed for muscle spasms.    Dispense:  60 tablet    Refill:  1   No orders of the defined types were placed in this encounter.     Return if symptoms worsen or fail to improve.     Jason J Matthews, MD   Primary Care Sports Medicine [facility]   

## 2021-06-08 NOTE — Assessment & Plan Note (Signed)
Patient with history of relative ramp-up in activity with high intensity interval training involving upper body rows etc. presenting with roughly 1 day history of severe neck and shoulder pain with radiation to the mid back.  She denies any upper extremity weakness, no paresthesias, no severe limitations in neck range of motion.  Physical exam reveals limited rightward cervical torsion when compared to contralateral, limited by pain, passive increased to nearly full range of motion again limited by pain, maximal pain with extension, bilateral shoulder exams are benign, Spurling's is negative bilaterally.  Tenderness throughout the upper trapezius and right greater than left rhomboids.  Clinical history and findings are most consistent with significant cervical paraspinal muscle spasm throughout the trapezius rhomboids, I have reviewed the same with the patient and various treatment strategies.  Will initiate a course of scheduled meloxicam, nightly cyclobenzaprine with additional doses being as needed, shutdown from current exercise program and start of home-based cervical spine rehab program next week.  She was advised to contact her office in 2 weeks for any suboptimal progress, at that time plain films to be ordered.  She will return for follow-up as previously scheduled on 06/28/2021.

## 2021-06-28 ENCOUNTER — Encounter: Payer: Self-pay | Admitting: Family Medicine

## 2021-06-28 ENCOUNTER — Ambulatory Visit: Payer: No Typology Code available for payment source | Admitting: Family Medicine

## 2021-06-28 ENCOUNTER — Other Ambulatory Visit: Payer: Self-pay

## 2021-06-28 VITALS — BP 104/74 | HR 82 | Temp 98.3°F | Ht 64.0 in | Wt 171.6 lb

## 2021-06-28 DIAGNOSIS — M5442 Lumbago with sciatica, left side: Secondary | ICD-10-CM

## 2021-06-28 DIAGNOSIS — M5441 Lumbago with sciatica, right side: Secondary | ICD-10-CM | POA: Diagnosis not present

## 2021-06-28 DIAGNOSIS — M62838 Other muscle spasm: Secondary | ICD-10-CM

## 2021-06-28 DIAGNOSIS — K5900 Constipation, unspecified: Secondary | ICD-10-CM | POA: Insufficient documentation

## 2021-06-28 DIAGNOSIS — G8929 Other chronic pain: Secondary | ICD-10-CM

## 2021-06-28 DIAGNOSIS — R635 Abnormal weight gain: Secondary | ICD-10-CM | POA: Diagnosis not present

## 2021-06-28 NOTE — Patient Instructions (Signed)
-   Dose senna-S tablet every a.m. and every p.m. scheduled - Dose 17 g of MiraLAX daily - Increase free water intake (use urine color as a guide on hydration status) - Adjust dose every 3 days to ensure regular smooth bowel movements - Perform home exercises with spine conditioning program information - follow-up February 2023 for annual physical - Contact us for any issues between now and then

## 2021-06-28 NOTE — Assessment & Plan Note (Signed)
Patient has shown weight decrease over interval visit, this is in conjunction with healthy dietary and exercise lifestyle modifications.  Given her progress I have encouraged continued management with the same, we will track this in the future.

## 2021-06-28 NOTE — Progress Notes (Signed)
Primary Care / Sports Medicine Office Visit  Patient Information:  Patient ID: Jacqueline Wilson, female DOB: February 02, 1981 Age: 40 y.o. MRN: [id number]   Jacqueline Wilson is a pleasant 40 y.o. female presenting with the following:  Chief Complaint  Patient presents with   Follow-up   Excessive weight gain    Referred to [facility] 05/31/21 for nutrition, but was not contacted per patient; has been exercising twice daily swimming and HIIT/cardio training, has also improved diet; has been checking measurements of body vs. weight   Neck Pain    Denies pain in office, better per patient   Back Pain    Lumbar X-Ray 05/31/21 negative; denies pain in office today    Review of Systems pertinent details above   Patient Active Problem List   Diagnosis Date Noted   Constipation 06/28/2021   Cervical paraspinal muscle spasm 06/08/2021   Excessive weight gain 05/31/2021   History of thyroidectomy 05/31/2021   Mixed obsessional thoughts and acts 05/31/2021   Chronic low back pain with bilateral sciatica 05/31/2021   Intractable menstrual migraine without status migrainosus 11/17/2019   Allergic reaction 12/29/2018   Seasonal and perennial allergic rhinitis 12/29/2018   Preventive measure 05/29/2018   Chronic migraine without aura 04/16/2017   Hypothyroidism 03/06/2017   Class 1 obesity without serious comorbidity with body mass index (BMI) of 32.0 to 32.9 in adult 03/06/2017   Insomnia 09/06/2016   Past Medical History:  Diagnosis Date   Allergy    BRCA negative 08/2018   MyRisk neg except PALB2 VUS   Colon polyp 02/2016   colonoscopy; repeat due in 3 yrs   Family history of breast cancer 08/2018   IBIS=18.7%   Internal hemorrhoids    Migraine    without aura and menstrual migraine with aura; sees neuro   Pre-eclampsia    Thyroid disease    Thryoidectomy   Outpatient Encounter Medications as of 06/28/2021  Medication Sig   albuterol (VENTOLIN HFA) 108 (90 Base) MCG/ACT inhaler  TAKE 2 PUFFS BY MOUTH EVERY 4-6 HOURS AS NEEDED FOR WHEEZE OR SHORTNESS OF BREATH   cetirizine (ZYRTEC) 10 MG tablet Take 10 mg by mouth daily.   cyclobenzaprine (FLEXERIL) 5 MG tablet Take 1-2 tablets (5-10 mg total) by mouth 3 (three) times daily as needed for muscle spasms.   EPINEPHrine 0.3 mg/0.3 mL IJ SOAJ injection Inject 0.3 mg into the muscle as needed for anaphylaxis.   fluticasone (FLOVENT HFA) 110 MCG/ACT inhaler Inhale 2 puffs twice a day with spacer to help prevent cough and wheeze.   ibuprofen (ADVIL) 200 MG tablet Take 400 mg by mouth every 6 (six) hours as needed.   Inulin (FIBER CHOICE PO) Take 5 capsules by mouth at bedtime.   levothyroxine (SYNTHROID) 175 MCG tablet TAKE 1 TABLET (175 MCG TOTAL) BY MOUTH DAILY BEFORE BREAKFAST.   Melatonin 2.5 MG CAPS Take 1 capsule by mouth at bedtime.   meloxicam (MOBIC) 15 MG tablet Take 1 tablet (15 mg total) by mouth daily.   Multiple Vitamin (MULTIVITAMIN) tablet Take 1 tablet by mouth daily.   Norethindrone-Ethinyl Estradiol-Fe Biphas (LO LOESTRIN FE) 1 MG-10 MCG / 10 MCG tablet TAKE 1 TABLET BY MOUTH DAILY. CONTINUOUS DOSING   nortriptyline (PAMELOR) 10 MG capsule TAKE 1 CAPSULE (10 MG TOTAL) BY MOUTH AT BEDTIME.   Vitamin D, Cholecalciferol, 25 MCG (1000 UT) CAPS Take 1 capsule by mouth daily.   zolpidem (AMBIEN) 5 MG tablet Take 0.5-1 tablets (2.5-5 mg  total) by mouth at bedtime.   [DISCONTINUED] budesonide (RHINOCORT AQUA) 32 MCG/ACT nasal spray Place 2 sprays into both nostrils daily as needed for rhinitis.   No facility-administered encounter medications on file as of 06/28/2021.   Past Surgical History:  Procedure Laterality Date   CESAREAN SECTION     COLONOSCOPY  03/27/2016   polyp, internal hemorrhoid; repeat in 3 yrs.   HEMORRHOID BANDING     POLYPECTOMY     THYROIDECTOMY  2010    Vitals:   06/28/21 1431  BP: 104/74  Pulse: 82  Temp: 98.3 F (36.8 C)  SpO2: 97%   Vitals:   06/28/21 1431  Weight: 171 lb 9.6  oz (77.8 kg)  Height: '5\' 4"'  (1.626 m)   Body mass index is 29.46 kg/m.  DG Lumbar Spine Complete  Result Date: 05/31/2021 CLINICAL DATA:  Chronic midline low back pain with intermittent right lower extremity paresthesia EXAM: LUMBAR SPINE - COMPLETE 4+ VIEW COMPARISON:  06/23/2017 FINDINGS: IUD noted in the midline of the pelvis. Lumbar spine alignment is within normal limits. Disc and vertebral body heights are maintained. No significant arthropathy is identified. IMPRESSION: No significant radiographic abnormality of the lumbar spine. Electronically Signed   By: Miachel Roux M.D.   On: 05/31/2021 14:15     Independent interpretation of notes and tests performed by another provider:   Independent interpretation of lumbar spine films dated 05/31/2021 reveal maintained alignment in the AP and lateral fields, prominent stool burden noted, intervertebral space overall preserved with focal intervertebral narrowing at the L5-S1 and T12-L1 locations, there is anterior endplate osteophytes at the upper level, facet hypertrophy and sclerosis at the L5-S1 articulation.  No acute osseous process identified. Procedures performed:   None  Pertinent History, Exam, Impression, and Recommendations:   Chronic low back pain with bilateral sciatica This is a chronic condition that has stabilized, we reviewed her lumbar films that were ordered at a previous visit and her physical exam findings are reassuring with no tenderness and sensorimotor functions intact.  Given the x-ray findings showing degenerative lumbosacral and thoracolumbar changes, I have advised a home-based spinal conditioning program  Constipation Incidentally noted increased stool burden on recent lumbar films, she does give history of constipation and hemorrhoids.  At this stage she is amenable to trial of OTC senna-S and MiraLAX, increased free water intake, and will contact her office in 4 weeks if symptoms fail to improve.  I encouraged her to  increase natural dietary fiber when she is currently in acting positive lifestyle changes inclusive of diet and exercise.  Excessive weight gain Patient has shown weight decrease over interval visit, this is in conjunction with healthy dietary and exercise lifestyle modifications.  Given her progress I have encouraged continued management with the same, we will track this in the future.  Cervical paraspinal muscle spasm This is a chronic issue that has stabilized without full resolution.  She states that following 1 week of medication dosing symptoms improved, denies any extremity paresthesias or weakness, has been back to her usual exercise regimen.  On exam there is focal tenderness at the levator scapula/trapezius regions on the left, Spurling's test is negative bilaterally, upper extremity testing shows symmetric strength, sensorimotor otherwise intact.  Given her definite progress over interval visit, I have advised she transition to a maintenance rehab program, a spinal conditioning program was provided to the patient today.  She is to work towards incorporating this into her regular routine, and can follow-up on an as-needed  basis for this issue.    Orders & Medications No orders of the defined types were placed in this encounter.  No orders of the defined types were placed in this encounter.    Return in about 7 months (around 01/30/2022) for Annual physical.     Montel Culver, MD   [place]

## 2021-06-28 NOTE — Assessment & Plan Note (Signed)
Incidentally noted increased stool burden on recent lumbar films, she does give history of constipation and hemorrhoids.  At this stage she is amenable to trial of OTC senna-S and MiraLAX, increased free water intake, and will contact her office in 4 weeks if symptoms fail to improve.  I encouraged her to increase natural dietary fiber when she is currently in acting positive lifestyle changes inclusive of diet and exercise.

## 2021-06-28 NOTE — Assessment & Plan Note (Signed)
This is a chronic issue that has stabilized without full resolution.  She states that following 1 week of medication dosing symptoms improved, denies any extremity paresthesias or weakness, has been back to her usual exercise regimen.  On exam there is focal tenderness at the levator scapula/trapezius regions on the left, Spurling's test is negative bilaterally, upper extremity testing shows symmetric strength, sensorimotor otherwise intact.  Given her definite progress over interval visit, I have advised she transition to a maintenance rehab program, a spinal conditioning program was provided to the patient today.  She is to work towards incorporating this into her regular routine, and can follow-up on an as-needed basis for this issue.

## 2021-06-28 NOTE — Assessment & Plan Note (Signed)
This is a chronic condition that has stabilized, we reviewed her lumbar films that were ordered at a previous visit and her physical exam findings are reassuring with no tenderness and sensorimotor functions intact.  Given the x-ray findings showing degenerative lumbosacral and thoracolumbar changes, I have advised a home-based spinal conditioning program

## 2021-07-03 ENCOUNTER — Other Ambulatory Visit: Payer: Self-pay | Admitting: Family Medicine

## 2021-07-03 DIAGNOSIS — E039 Hypothyroidism, unspecified: Secondary | ICD-10-CM

## 2021-07-04 ENCOUNTER — Encounter: Payer: Self-pay | Admitting: Family Medicine

## 2021-07-04 DIAGNOSIS — E039 Hypothyroidism, unspecified: Secondary | ICD-10-CM

## 2021-07-04 NOTE — Telephone Encounter (Signed)
Please review. Pt needs synthroid.  KP

## 2021-07-05 MED ORDER — LEVOTHYROXINE SODIUM 175 MCG PO TABS
ORAL_TABLET | ORAL | 0 refills | Status: DC
Start: 2021-07-05 — End: 2021-08-04

## 2021-07-26 ENCOUNTER — Ambulatory Visit: Payer: Self-pay | Admitting: Family Medicine

## 2021-08-03 ENCOUNTER — Other Ambulatory Visit: Payer: Self-pay | Admitting: Family Medicine

## 2021-08-03 DIAGNOSIS — E039 Hypothyroidism, unspecified: Secondary | ICD-10-CM

## 2021-08-03 NOTE — Telephone Encounter (Signed)
Requested medication (s) are due for refill today: yes  Requested medication (s) are on the active medication list: yes  Last refill:  07/05/21 #30  Future visit scheduled: yes  Notes to clinic:  Please review for refill. Per MyChart message patient does not have appt with endocrinologist until 09/04/21. Patient will need refill until that appt if apppropirate    Requested Prescriptions  Pending Prescriptions Disp Refills   levothyroxine (SYNTHROID) 175 MCG tablet [Pharmacy Med Name: LEVOTHYROXINE 0.'175MG'$  (175MCG) TABS] 30 tablet 0    Sig: TAKE 1 TABLET(175 MCG) BY MOUTH DAILY BEFORE BREAKFAST      Endocrinology:  Hypothyroid Agents Failed - 08/03/2021 10:21 AM      Failed - TSH needs to be rechecked within 3 months after an abnormal result. Refill until TSH is due.      Failed - TSH in normal range and within 360 days    TSH  Date Value Ref Range Status  01/09/2021 0.04 (L) mIU/L Final    Comment:              Reference Range .           > or = 20 Years  0.40-4.50 .                Pregnancy Ranges           First trimester    0.26-2.66           Second trimester   0.55-2.73           Third trimester    0.43-2.91           Passed - Valid encounter within last 12 months    Recent Outpatient Visits           1 month ago Constipation, unspecified constipation type   [facility] Montel Culver, MD   1 month ago Cervical paraspinal muscle spasm   [facility] Montel Culver, MD   2 months ago Excessive weight gain   [facility] Montel Culver, MD                  levothyroxine (SYNTHROID) 175 MCG tablet [Pharmacy Med Name: LEVOTHYROXINE 0.'175MG'$  (175MCG) TABS] 30 tablet 0    Sig: TAKE 1 TABLET(175 MCG) BY MOUTH DAILY BEFORE BREAKFAST      Endocrinology:  Hypothyroid Agents Failed - 08/03/2021 10:21 AM      Failed - TSH needs to be rechecked within 3 months after an abnormal result. Refill until TSH is due.      Failed - TSH in  normal range and within 360 days    TSH  Date Value Ref Range Status  01/09/2021 0.04 (L) mIU/L Final    Comment:              Reference Range .           > or = 20 Years  0.40-4.50 .                Pregnancy Ranges           First trimester    0.26-2.66           Second trimester   0.55-2.73           Third trimester    0.43-2.91           Passed - Valid encounter within last 12 months    Recent Outpatient Visits  1 month ago Constipation, unspecified constipation type   [facility] Montel Culver, MD   1 month ago Cervical paraspinal muscle spasm   [facility] Montel Culver, MD   2 months ago Excessive weight gain   [facility] Montel Culver, MD

## 2021-08-04 ENCOUNTER — Other Ambulatory Visit: Payer: Self-pay | Admitting: Family Medicine

## 2021-08-04 DIAGNOSIS — E039 Hypothyroidism, unspecified: Secondary | ICD-10-CM

## 2021-08-06 MED ORDER — LEVOTHYROXINE SODIUM 175 MCG PO TABS: ORAL_TABLET | ORAL | 0 refills | Status: DC

## 2021-08-20 ENCOUNTER — Other Ambulatory Visit: Payer: Self-pay

## 2021-08-20 ENCOUNTER — Encounter: Payer: Self-pay | Admitting: Dietician

## 2021-08-20 ENCOUNTER — Encounter: Payer: No Typology Code available for payment source | Attending: Family Medicine | Admitting: Dietician

## 2021-08-20 VITALS — Ht 63.0 in | Wt 178.9 lb

## 2021-08-20 DIAGNOSIS — R635 Abnormal weight gain: Secondary | ICD-10-CM

## 2021-08-20 NOTE — Patient Instructions (Signed)
Aim for about 1400 calories daily. Keep balance of macros the same, about 45% carb, 25% protein and 30% fat. This balance averages out, so it's ok to eat more protein and less carbs some days and reverse on other days. Try eating a small meal when dinner is late; ok to have a lower-calorie Clean [employer] meal or a protein shake if you have had a light meal or snack late afternoon/ early evening.

## 2021-08-20 NOTE — Progress Notes (Signed)
Medical Nutrition Therapy: Visit start time: [id number]  end time: [id number] Assessment:  Diagnosis: abnormal weight gain Past medical history: thyroidectomy 2010, severe allergies (rubbing alcohol, beans/ legumes) Psychosocial issues/ stress concerns: high stress level; manages stress with exercise and sometimes eating  Preferred learning method:  Visual Hands-on   Current weight: 178.9lbs Height: 5'3" BMI: 31.69  Medications, supplements: reconciled list in medical record  Progress and evaluation:  Patient reports lack of thyroid gland, sees endo in 2 weeks  Has chronic dehydration, having to drink >150oz water daily to avoid symptoms including migraines Works 2 high-stress jobs, [place] most days. No time for cooking and unable to cook; husband cooks or she will consume protein shakes, clean eatz frozen meal, or takeout via door dash Lost to 147lbs when working with Physiological scientist on body building; then started having more muscle injuries. Very high pain tolerance.  Has chronic insomnia, which was worse when following keto diet Did metabolic renewal program which advised some carb at night due to insomnia. Felt nauseated due to hunger. Halted weight gain but did not lose.   Does track calories on some days, recent day included: 1100kcal, 54g pro, 83g net carbs, 11g fiber, 37g fat  Physical activity: 1-2 hours daily aerobic, strength training, swimming; starts with new personal trainer 08/24/21   Dietary Intake:  Usual eating pattern includes 3 meals and 1-2 snacks per day. Dining out frequency: 5-6 meals per week.  Breakfast: protein shake after 7am;  Snack: 10:30am -- English muffin with peanut butter; some days is very hungry and will graze on doughnuts Lunch: 1:30pm --  Clean Eats meal; on "hungry" day door dash lg sub with chicken bacon cheese; often following by lg cookie Snack:  Supper: sometimes as late as 10pm Mon - Thurs -- slim fast shake; husband cooks and will leave plated  meal for patient -- chicken + fresh corn, green beans, potato about once a week, zucchini, black eyed peas, cucumber; weekends are out Snack: usually none Beverages: water, caff free diet Mt Dew at lunchtime occ 2 per day; tea with splenda -- drinks during night to resume sleep  Nutrition Care Education: Topics covered:  Basic nutrition: appropriate nutrient balance; appropriate meal and snack schedule; general nutrition guidelines    Weight control: importance of low sugar and low fat choices; appropriate food portions; estimated energy needs for weight loss at 1400 kcal, provided guidance for 45% CHO, 25% protein, 30% fat; dicussed eating smaller meal when eating later at night with light meal or snack earlier evening; discussed roles of carb and protein on weight and fuel for exercise Advanced nutrition:  recipe modification, cooking techniques, dining out, food label reading Other: discussed Mediterranean eating pattern as healthiest  Nutritional Diagnosis:  [place]-3.3 Overweight/obesity As related to hypothyroidism/ thyroidectomy, stress, history of excess calories.  As evidenced by patient with current BMI of 31.69, working on diet and lifestyle changes to promote weight loss.  Intervention:  Instruction and discussion as noted above. Patient is working diligently on weight loss and is motivated to continue.  Established additional goals for change with direction from patient. No follow up scheduled at this time; patient will schedule later if needed.  Education Materials given:  Museum/gallery conservator with food lists, sample meal pattern Get Healthy with Mediterranean Style Eating Visit summary with goals/ instructions   Learner/ who was taught:  Patient    Level of understanding: Verbalizes/ demonstrates competency   Demonstrated degree of understanding via:   Teach back  Learning barriers: None  Willingness to learn/ readiness for change: Eager, change in progress   Monitoring and  Evaluation:  Dietary intake, exercise, and body weight      follow up: prn

## 2021-08-30 ENCOUNTER — Encounter: Payer: Self-pay | Admitting: Family Medicine

## 2021-08-30 DIAGNOSIS — G43709 Chronic migraine without aura, not intractable, without status migrainosus: Secondary | ICD-10-CM

## 2021-08-30 MED ORDER — NORTRIPTYLINE HCL 10 MG PO CAPS: 10.0000 mg | ORAL_CAPSULE | Freq: Every day | ORAL | 0 refills | Status: DC

## 2021-08-30 NOTE — Telephone Encounter (Signed)
Prescription sent to the pharmacy electronically.

## 2021-08-30 NOTE — Telephone Encounter (Signed)
Refill if appropriate.  Please advise.  

## 2021-09-03 ENCOUNTER — Other Ambulatory Visit: Payer: Self-pay | Admitting: Family Medicine

## 2021-09-03 DIAGNOSIS — E039 Hypothyroidism, unspecified: Secondary | ICD-10-CM

## 2021-09-04 ENCOUNTER — Other Ambulatory Visit: Payer: Self-pay

## 2021-09-04 ENCOUNTER — Encounter: Payer: Self-pay | Admitting: Endocrinology

## 2021-09-04 ENCOUNTER — Ambulatory Visit: Payer: No Typology Code available for payment source | Admitting: Endocrinology

## 2021-09-04 VITALS — BP 118/70 | HR 93 | Ht 63.0 in | Wt 179.6 lb

## 2021-09-04 DIAGNOSIS — R635 Abnormal weight gain: Secondary | ICD-10-CM

## 2021-09-04 DIAGNOSIS — E559 Vitamin D deficiency, unspecified: Secondary | ICD-10-CM | POA: Insufficient documentation

## 2021-09-04 DIAGNOSIS — E611 Iron deficiency: Secondary | ICD-10-CM | POA: Diagnosis not present

## 2021-09-04 DIAGNOSIS — E039 Hypothyroidism, unspecified: Secondary | ICD-10-CM | POA: Diagnosis not present

## 2021-09-04 MED ORDER — DEXAMETHASONE 1 MG PO TABS: 1.0000 mg | ORAL_TABLET | ORAL | 0 refills | Status: DC

## 2021-09-04 NOTE — Telephone Encounter (Signed)
Requested medication (s) are due for refill today: yes  Requested medication (s) are on the active medication list: yes  Last refill:  08/06/21  Future visit scheduled: yes  Notes to clinic:  pt did not f/u for lab work // pt TSH very low. Pt has upcoming appt   Requested Prescriptions  Pending Prescriptions Disp Refills   levothyroxine (SYNTHROID) 175 MCG tablet [Pharmacy Med Name: LEVOTHYROXINE 0.'175MG'$  (175MCG) TABS] 30 tablet 0    Sig: TAKE 1 TABLET(175 MCG) BY MOUTH DAILY BEFORE AND BREAKFAST     Endocrinology:  Hypothyroid Agents Failed - 09/03/2021 10:00 AM      Failed - TSH needs to be rechecked within 3 months after an abnormal result. Refill until TSH is due.      Failed - TSH in normal range and within 360 days    TSH  Date Value Ref Range Status  01/09/2021 0.04 (L) mIU/L Final    Comment:              Reference Range .           > or = 20 Years  0.40-4.50 .                Pregnancy Ranges           First trimester    0.26-2.66           Second trimester   0.55-2.73           Third trimester    0.43-2.91           Passed - Valid encounter within last 12 months    Recent Outpatient Visits           2 months ago Constipation, unspecified constipation type   [facility] Montel Culver, MD   2 months ago Cervical paraspinal muscle spasm   [facility] Montel Culver, MD   3 months ago Excessive weight gain   [facility] Montel Culver, MD       Future Appointments             In 1 month Renato Shin, MD [facility] Endocrinology

## 2021-09-04 NOTE — Progress Notes (Signed)
Subjective:    Patient ID: Jacqueline Wilson, female    DOB: November 20, 1981, 40 y.o.   MRN: [id number]  HPI Pt is referred by Dr Zigmund Daniel, for hypothyroidism.  She had thyroidect in approx 2002, due to airway impingement.  she has been on prescribed thyroid hormone therapy since rx.  she has never taken kelp or any other type of non-prescribed thyroid product. She has IUD.  she has never had XRT to the neck.  He has never been on amiodarone or lithium.  She has been on her current 175 mcg/d, since 2021.  She reports intermitt lightheadedness, hair loss, weight gain, insomnia, fatigue, and blurry vision.  In each case, TSH is found to be elevated, and synthroid is increased, with improvement in sxs.   Past Medical History:  Diagnosis Date   Allergy    BRCA negative 08/2018   MyRisk neg except PALB2 VUS   Colon polyp 02/2016   colonoscopy; repeat due in 3 yrs   Family history of breast cancer 08/2018   IBIS=18.7%   Internal hemorrhoids    Migraine    without aura and menstrual migraine with aura; sees neuro   Pre-eclampsia    Thyroid disease    Thryoidectomy    Past Surgical History:  Procedure Laterality Date   CESAREAN SECTION     COLONOSCOPY  03/27/2016   polyp, internal hemorrhoid; repeat in 3 yrs.   HEMORRHOID BANDING     POLYPECTOMY     THYROIDECTOMY  2010    Social History   Socioeconomic History   Marital status: Married    Spouse name: Luxemburg Cohick   Number of children: 1   Years of education: 18   Highest education level: Master's degree (e.g., MA, MS, MEng, MEd, MSW, MBA)  Occupational History   Occupation: Pharmacologist    Occupation: Public librarian [employer]    Comment: OCD and related disorders  Tobacco Use   Smoking status: Never   Smokeless tobacco: Never  Vaping Use   Vaping Use: Never used  Substance and Sexual Activity   Alcohol use: Not Currently    Alcohol/week: 0.0 standard drinks   Drug use: Never   Sexual activity: Yes    Birth  control/protection: I.U.D.  Other Topics Concern   Not on file  Social History Narrative   Not on file   Social Determinants of Health   Financial Resource Strain: Not on file  Food Insecurity: Not on file  Transportation Needs: Not on file  Physical Activity: Not on file  Stress: Not on file  Social Connections: Not on file  Intimate Partner Violence: Not on file    Current Outpatient Medications on File Prior to Visit  Medication Sig Dispense Refill   albuterol (VENTOLIN HFA) 108 (90 Base) MCG/ACT inhaler TAKE 2 PUFFS BY MOUTH EVERY 4-6 HOURS AS NEEDED FOR WHEEZE OR SHORTNESS OF BREATH 18 g 1   cetirizine (ZYRTEC) 10 MG tablet Take 10 mg by mouth daily.     EPINEPHrine 0.3 mg/0.3 mL IJ SOAJ injection Inject 0.3 mg into the muscle as needed for anaphylaxis. 1 each 0   fluticasone (FLOVENT HFA) 110 MCG/ACT inhaler Inhale 2 puffs twice a day with spacer to help prevent cough and wheeze. 1 each 3   ibuprofen (ADVIL) 200 MG tablet Take 400 mg by mouth every 6 (six) hours as needed.     Inulin (FIBER CHOICE PO) Take 5 capsules by mouth at bedtime.     levothyroxine (SYNTHROID) 175 MCG  tablet TAKE 1 TABLET (175 MCG TOTAL) BY MOUTH DAILY BEFORE BREAKFAST. 30 tablet 0   Melatonin 2.5 MG CAPS Take 1 capsule by mouth at bedtime.     meloxicam (MOBIC) 15 MG tablet Take 1 tablet (15 mg total) by mouth daily. 30 tablet 0   Multiple Vitamin (MULTIVITAMIN) tablet Take 1 tablet by mouth daily.     Norethindrone-Ethinyl Estradiol-Fe Biphas (LO LOESTRIN FE) 1 MG-10 MCG / 10 MCG tablet TAKE 1 TABLET BY MOUTH DAILY. CONTINUOUS DOSING 84 tablet 4   nortriptyline (PAMELOR) 10 MG capsule Take 1 capsule (10 mg total) by mouth at bedtime. 90 capsule 0   Vitamin D, Cholecalciferol, 25 MCG (1000 UT) CAPS Take 1 capsule by mouth daily.     zolpidem (AMBIEN) 5 MG tablet Take 0.5-1 tablets (2.5-5 mg total) by mouth at bedtime. 90 tablet 0   No current facility-administered medications on file prior to visit.     Allergies  Allergen Reactions   Flonase [Fluticasone Propionate] Anaphylaxis    Has alcohol in nasal spray   Hydrocodone-Acetaminophen Other (See Comments)    Suicidal    Latex Anaphylaxis   Rubbing Alcohol [Alcohol] Anaphylaxis   Topamax [Topiramate] Other (See Comments)    HI/SI   Bean Pod Extract Swelling    Butter Bean, Baked Bean: Facial Swelling   Hydrocodone     Suicidal thoughts with medication   Mucinex [Guaifenesin Er] Other (See Comments)    Lose hearing for 1-2 weeks.   Penicillins    Cephalexin Rash    Family History  Problem Relation Age of Onset   Obesity Mother    Heart disease Father    COPD Father        smoker   Lung cancer Father    Thyroid disease Maternal Grandmother    Breast cancer Maternal Grandmother 29   Colon cancer Paternal Uncle    Esophageal cancer Neg Hx    Migraines Neg Hx    Ovarian cancer Neg Hx    Colon polyps Neg Hx     BP 118/70 (BP Location: Right Arm, Patient Position: Sitting, Cuff Size: Large)   Pulse 93   Ht '5\' 3"'  (1.6 m)   Wt 179 lb 9.6 oz (81.5 kg)   SpO2 97%   BMI 31.81 kg/m    Review of Systems denies depression, constipation, numbness, and dry skin.  She has ongoing weight gain and cold intolerance.       Objective:   Physical Exam VS: see vs page GEN: no distress HEAD: head: no deformity eyes: no periorbital swelling, no proptosis external nose and ears are normal NECK: a healed scar is present.  I do not appreciate a nodule in the thyroid or elsewhere in the neck CHEST WALL: no deformity LUNGS: clear to auscultation CV: reg rate and rhythm, no murmur.  MUSCULOSKELETAL: gait is normal and steady EXTEMITIES: no deformity.  no leg edema NEURO:  readily moves all 4's.  sensation is intact to touch on all 4's SKIN:  Normal texture and temperature.  No rash or suspicious lesion is visible.   NODES:  None palpable at the neck PSYCH: alert, well-oriented.  Does not appear anxious nor depressed.  Lab  Results  Component Value Date   TSH 0.04 (L) 01/09/2021   I have reviewed outside records, and summarized: Pt was noted to have abnormal TFT, and referred here.  She was seen for sciatica and neck spasms.       Assessment & Plan:  Hypothyroidism: overcontrolled Weight gain, new to me, uncertain etiology and prognosis.     Patient Instructions  You should do a "dexamethasone suppression test."  For this, you would take dexamethasone 1 mg at 10 pm (I have sent a prescription to your pharmacy), then come in for a "cortisol" blood test the next morning before 9 am.  You do not need to be fasting for this test.  We'll check the thyroid and other blood tests at the same time.   Our plan will be to make small adjustments in the thyroid medication.   Please see a weight loss specialist.  you will receive a phone call, about a day and time for an appointment

## 2021-09-04 NOTE — Patient Instructions (Addendum)
You should do a "dexamethasone suppression test."  For this, you would take dexamethasone 1 mg at 10 pm (I have sent a prescription to your pharmacy), then come in for a "cortisol" blood test the next morning before 9 am.  You do not need to be fasting for this test.  We'll check the thyroid and other blood tests at the same time.   Our plan will be to make small adjustments in the thyroid medication.   Please see a weight loss specialist.  you will receive a phone call, about a day and time for an appointment

## 2021-09-05 ENCOUNTER — Encounter: Payer: Self-pay | Admitting: Endocrinology

## 2021-09-06 ENCOUNTER — Other Ambulatory Visit: Payer: Self-pay

## 2021-09-06 DIAGNOSIS — E039 Hypothyroidism, unspecified: Secondary | ICD-10-CM

## 2021-09-06 MED ORDER — LEVOTHYROXINE SODIUM 175 MCG PO TABS: ORAL_TABLET | ORAL | 0 refills | Status: DC

## 2021-09-14 ENCOUNTER — Other Ambulatory Visit: Payer: No Typology Code available for payment source

## 2021-09-17 ENCOUNTER — Other Ambulatory Visit: Payer: No Typology Code available for payment source

## 2021-09-17 ENCOUNTER — Other Ambulatory Visit (INDEPENDENT_AMBULATORY_CARE_PROVIDER_SITE_OTHER): Payer: No Typology Code available for payment source

## 2021-09-17 ENCOUNTER — Other Ambulatory Visit: Payer: Self-pay

## 2021-09-17 ENCOUNTER — Other Ambulatory Visit: Payer: Self-pay | Admitting: Endocrinology

## 2021-09-17 ENCOUNTER — Encounter: Payer: Self-pay | Admitting: Endocrinology

## 2021-09-17 DIAGNOSIS — E611 Iron deficiency: Secondary | ICD-10-CM

## 2021-09-17 DIAGNOSIS — E559 Vitamin D deficiency, unspecified: Secondary | ICD-10-CM

## 2021-09-17 DIAGNOSIS — E039 Hypothyroidism, unspecified: Secondary | ICD-10-CM

## 2021-09-17 DIAGNOSIS — R635 Abnormal weight gain: Secondary | ICD-10-CM

## 2021-09-17 LAB — VITAMIN D 25 HYDROXY (VIT D DEFICIENCY, FRACTURES): Vit D, 25-Hydroxy: 43 ng/mL (ref 30–100)

## 2021-09-17 NOTE — Addendum Note (Signed)
Addended by: Leeanne Rio on: 09/17/2021 08:44 AM   Modules accepted: Orders

## 2021-09-17 NOTE — Addendum Note (Signed)
Addended by: Leeanne Rio on: 09/17/2021 08:41 AM   Modules accepted: Orders

## 2021-09-17 NOTE — Addendum Note (Signed)
Addended by: Leeanne Rio on: 09/17/2021 08:48 AM   Modules accepted: Orders

## 2021-09-17 NOTE — Addendum Note (Signed)
Addended by: Leeanne Rio on: 09/17/2021 08:46 AM   Modules accepted: Orders

## 2021-09-18 ENCOUNTER — Encounter: Payer: Self-pay | Admitting: Family Medicine

## 2021-09-18 ENCOUNTER — Other Ambulatory Visit: Payer: Self-pay | Admitting: Endocrinology

## 2021-09-18 DIAGNOSIS — E039 Hypothyroidism, unspecified: Secondary | ICD-10-CM

## 2021-09-18 LAB — CORTISOL: Cortisol, Plasma: 0.9 ug/dL — ABNORMAL LOW

## 2021-09-18 LAB — T4, FREE: Free T4: 1.5 ng/dL (ref 0.8–1.8)

## 2021-09-18 LAB — IRON,TIBC AND FERRITIN PANEL
%SAT: 27 % (calc) (ref 16–45)
Ferritin: 370 ng/mL — ABNORMAL HIGH (ref 16–154)
Iron: 101 ug/dL (ref 40–190)
TIBC: 377 mcg/dL (calc) (ref 250–450)

## 2021-09-18 LAB — T3, FREE: T3, Free: 2.8 pg/mL (ref 2.3–4.2)

## 2021-09-18 LAB — TSH: TSH: 0.07 mIU/L — ABNORMAL LOW

## 2021-09-18 MED ORDER — LEVOTHYROXINE SODIUM 150 MCG PO TABS: 150.0000 ug | ORAL_TABLET | Freq: Every day | ORAL | 1 refills | Status: DC

## 2021-09-19 ENCOUNTER — Encounter: Payer: Self-pay | Admitting: Family Medicine

## 2021-09-19 NOTE — Telephone Encounter (Signed)
Please advise for elevated ferritin level.

## 2021-09-20 NOTE — Telephone Encounter (Signed)
Please schedule 40 minute follow-up at patient's convenience.

## 2021-10-08 ENCOUNTER — Encounter: Payer: Self-pay | Admitting: Endocrinology

## 2021-10-08 ENCOUNTER — Telehealth (INDEPENDENT_AMBULATORY_CARE_PROVIDER_SITE_OTHER): Payer: No Typology Code available for payment source | Admitting: Endocrinology

## 2021-10-08 DIAGNOSIS — E039 Hypothyroidism, unspecified: Secondary | ICD-10-CM | POA: Diagnosis not present

## 2021-10-08 NOTE — Patient Instructions (Signed)
Blood tests are requested for you today.  We'll let you know about the results.   We understand that this is a little early, but we'll take that into account.   If these tests are normal, please ask Dr Jacqueline Wilson about your symptoms.   Please come back for a follow-up appointment in 3 months, by video if you prefer.

## 2021-10-08 NOTE — Progress Notes (Signed)
Subjective:    Patient ID: Jacqueline Wilson, female    DOB: September 28, 1981, 40 y.o.   MRN: [id number]  HPI Pt returns for f/u of postsurgical hypothyroidism (she had thyroidect in approx 2002, due to airway impingement; pathol was benign per pt; she has been on prescribed thyroid hormone therapy since then; she has IUD; several times TSH was found to be elevated, and synthroid is increased, with improvement in sxs; she wants all labs sent to [place]; she works 2 jobs).   telehealth visit today via video visit.  Alternatives to telehealth are presented to this patient, and the patient agrees to the telehealth visit.  Pt is advised of the cost of the visit, and agrees to this, also.   Patient is at home [place], [place]), and I am at the office.   Persons attending the telehealth visit: the patient and I.  She reports insomnia and weight gain.  She says since synthroid was reduced, insomnia is worse, but other sxs are better.   Past Medical History:  Diagnosis Date   Allergy    BRCA negative 08/2018   MyRisk neg except PALB2 VUS   Colon polyp 02/2016   colonoscopy; repeat due in 3 yrs   Family history of breast cancer 08/2018   IBIS=18.7%   Internal hemorrhoids    Migraine    without aura and menstrual migraine with aura; sees neuro   Pre-eclampsia    Thyroid disease    Thryoidectomy    Past Surgical History:  Procedure Laterality Date   CESAREAN SECTION     COLONOSCOPY  03/27/2016   polyp, internal hemorrhoid; repeat in 3 yrs.   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  Other Topics Concern   Not on file  Social History Narrative   Not on file   Social Determinants of Health   Financial Resource Strain: Not on file  Food Insecurity: Not on file  Transportation Needs: Not on file  Physical Activity: Not on file  Stress: Not on file  Social Connections: Not on file  Intimate Partner Violence: Not on file    Current Outpatient Medications on File Prior to Visit  Medication Sig Dispense Refill   albuterol (VENTOLIN HFA) 108 (90 Base) MCG/ACT inhaler TAKE 2 PUFFS BY MOUTH EVERY 4-6 HOURS AS NEEDED FOR WHEEZE OR SHORTNESS OF BREATH 18 g 1   cetirizine (ZYRTEC) 10 MG tablet Take 10 mg by mouth daily.     EPINEPHrine 0.3 mg/0.3 mL IJ SOAJ injection Inject 0.3 mg into the muscle as needed for anaphylaxis. 1 each 0   fluticasone (FLOVENT HFA) 110 MCG/ACT inhaler Inhale 2 puffs twice a day with spacer to help prevent cough and wheeze. 1 each 3  ibuprofen (ADVIL) 200 MG tablet Take 400 mg by mouth every 6 (six) hours as needed.     Inulin (FIBER CHOICE PO) Take 5 capsules by mouth at bedtime.     levothyroxine (SYNTHROID) 150 MCG tablet Take 1 tablet (150 mcg total) by mouth daily. 30 tablet 1   Melatonin 2.5 MG CAPS Take 1 capsule by mouth at bedtime.     meloxicam (MOBIC) 15 MG tablet Take 1 tablet (15 mg total) by mouth daily. 30 tablet 0   Multiple Vitamin (MULTIVITAMIN) tablet Take 1 tablet by mouth daily.     Norethindrone-Ethinyl Estradiol-Fe Biphas (LO LOESTRIN FE) 1 MG-10 MCG / 10 MCG tablet TAKE 1 TABLET BY MOUTH DAILY. CONTINUOUS DOSING 84 tablet 4   nortriptyline (PAMELOR) 10 MG capsule Take 1 capsule (10 mg total) by mouth at bedtime. 90 capsule 0   Vitamin D, Cholecalciferol, 25 MCG (1000 UT) CAPS Take 1  capsule by mouth daily.     zolpidem (AMBIEN) 5 MG tablet Take 0.5-1 tablets (2.5-5 mg total) by mouth at bedtime. 90 tablet 0   No current facility-administered medications on file prior to visit.    Allergies  Allergen Reactions   Flonase [Fluticasone Propionate] Anaphylaxis    Has alcohol in nasal spray   Hydrocodone-Acetaminophen Other (See Comments)    Suicidal    Latex Anaphylaxis   Rubbing Alcohol [Alcohol] Anaphylaxis   Topamax [Topiramate] Other (See Comments)    HI/SI   Bean Pod Extract Swelling    Butter Bean, Baked Bean: Facial Swelling   Hydrocodone     Suicidal thoughts with medication   Mucinex [Guaifenesin Er] Other (See Comments)    Lose hearing for 1-2 weeks.   Penicillins    Cephalexin Rash    Family History  Problem Relation Age of Onset   Obesity Mother    Heart disease Father    COPD Father        smoker   Lung cancer Father    Thyroid disease Maternal Grandmother    Breast cancer Maternal Grandmother 58   Colon cancer Paternal Uncle    Esophageal cancer Neg Hx    Migraines Neg Hx    Ovarian cancer Neg Hx    Colon polyps Neg Hx     There were no vitals taken for this visit.   Review of Systems She denies dry skin    Objective:   Physical Exam      Assessment & Plan:  Hypothyroidism, postsurgical Sxs of insomnia and weight gain, uncertain etiology and prognosis.  Plan is to check TFT. Same synthroid until then.

## 2021-10-19 ENCOUNTER — Encounter: Payer: Self-pay | Admitting: Family Medicine

## 2021-10-19 ENCOUNTER — Telehealth (INDEPENDENT_AMBULATORY_CARE_PROVIDER_SITE_OTHER): Payer: No Typology Code available for payment source | Admitting: Family Medicine

## 2021-10-19 VITALS — Ht 63.0 in | Wt 179.6 lb

## 2021-10-19 DIAGNOSIS — E782 Mixed hyperlipidemia: Secondary | ICD-10-CM

## 2021-10-19 DIAGNOSIS — G4700 Insomnia, unspecified: Secondary | ICD-10-CM | POA: Diagnosis not present

## 2021-10-19 DIAGNOSIS — E039 Hypothyroidism, unspecified: Secondary | ICD-10-CM

## 2021-10-19 DIAGNOSIS — R748 Abnormal levels of other serum enzymes: Secondary | ICD-10-CM | POA: Insufficient documentation

## 2021-10-19 DIAGNOSIS — R7989 Other specified abnormal findings of blood chemistry: Secondary | ICD-10-CM | POA: Insufficient documentation

## 2021-10-19 DIAGNOSIS — M2550 Pain in unspecified joint: Secondary | ICD-10-CM | POA: Insufficient documentation

## 2021-10-19 NOTE — Assessment & Plan Note (Signed)
Longstanding issue that patient cites has been ongoing since childhood, relays multiple sleep studies, currently treated with Ambien as needed with sometimes subsequent days of sleepless nights.  Given her comorbid thyroid ongoing management, I have advised patient that we will revisit this issue once thyroid is adequately controlled.  Patient is understanding and amenable to this plan at this time.

## 2021-10-19 NOTE — Assessment & Plan Note (Signed)
LFTs have trended upward based on EMR review where ALT noted to be elevated out of range earlier this year.  This is in the setting of hyperlipidemia and can possibly represent fatty liver.  Liver function testing labs ordered, pending results next steps can possibly include hepatic ultrasound elastography.

## 2021-10-19 NOTE — Assessment & Plan Note (Signed)
Incidentally noted elevated ferritin during endocrinology work-up, differential to include infection, inflammation, hepatic disease.  Patient denies any transfusions, TSAT and other labs reassuring.  Primary concern for inflammatory block with further work-up to include evaluating for chronic liver disease, sources of chronic inflammation.  Liver testing and autoimmune work-up ordered.  We will follow labs once resulted.  She plans to obtain these early next week.

## 2021-10-19 NOTE — Assessment & Plan Note (Signed)
Patient cites pain in the neck, shoulders, ribs, knee and foot/ankle.  We have previously evaluated patient for cervical paraspinal spasm with concern for upper cross syndrome.  That being said, given her elevated ferritin, inflammatory work-up to commence inclusive of autoimmune panel.  She is also considering formal physical therapy, I have advised her to contact our office so that we can send a referral accordingly to the group of her choosing.  We will follow lab results once available.

## 2021-10-19 NOTE — Assessment & Plan Note (Signed)
Previously noted in the setting of elevated ALT, and subsequent elevated ferritin.  We will recheck lipid panel and follow-up accordingly.

## 2021-10-19 NOTE — Assessment & Plan Note (Addendum)
Being actively managed by endocrinology, Dr. [name], recent thyroid labs were ordered, she plans to obtain these early next week.  We will follow peripherally on this issue.

## 2021-10-19 NOTE — Progress Notes (Signed)
Primary Care / Sports Medicine Virtual Visit  Patient Information:  Patient ID: Jacqueline Wilson, female DOB: [birth year] Age: 40 y.o. MRN: [id number]   Jacqueline Wilson is a pleasant 40 y.o. female presenting with the following:  Chief Complaint  Patient presents with   Follow-up    Follow up to discuss labs.    Review of Systems: No fevers, chills, night sweats, weight loss, chest pain, or shortness of breath.   Patient Active Problem List   Diagnosis Date Noted   Elevated ferritin 10/19/2021   Abnormal liver enzymes 10/19/2021   Mixed hyperlipidemia 10/19/2021   Polyarthralgia 10/19/2021   Iron deficiency 09/04/2021   Vitamin D deficiency 09/04/2021   Constipation 06/28/2021   Cervical paraspinal muscle spasm 06/08/2021   Weight gain 05/31/2021   History of thyroidectomy 05/31/2021   Mixed obsessional thoughts and acts 05/31/2021   Chronic low back pain with bilateral sciatica 05/31/2021   Intractable menstrual migraine without status migrainosus 11/17/2019   Allergic reaction 12/29/2018   Seasonal and perennial allergic rhinitis 12/29/2018   Preventive measure 05/29/2018   Chronic migraine without aura 04/16/2017   Hypothyroidism 03/06/2017   Class 1 obesity without serious comorbidity with body mass index (BMI) of 32.0 to 32.9 in adult 03/06/2017   Insomnia 09/06/2016   Past Medical History:  Diagnosis Date   Allergy    BRCA negative 08/2018   MyRisk neg except PALB2 VUS   Colon polyp 02/2016   colonoscopy; repeat due in 3 yrs   Family history of breast cancer 08/2018   IBIS=18.7%   Internal hemorrhoids    Migraine    without aura and menstrual migraine with aura; sees neuro   Pre-eclampsia    Thyroid disease    Thryoidectomy   Outpatient Encounter Medications as of 10/19/2021  Medication Sig   albuterol (VENTOLIN HFA) 108 (90 Base) MCG/ACT inhaler TAKE 2 PUFFS BY MOUTH EVERY 4-6 HOURS AS NEEDED FOR WHEEZE OR SHORTNESS OF BREATH   cetirizine (ZYRTEC) 10 MG  tablet Take 10 mg by mouth daily.   EPINEPHrine 0.3 mg/0.3 mL IJ SOAJ injection Inject 0.3 mg into the muscle as needed for anaphylaxis.   fluticasone (FLOVENT HFA) 110 MCG/ACT inhaler Inhale 2 puffs twice a day with spacer to help prevent cough and wheeze.   ibuprofen (ADVIL) 200 MG tablet Take 400 mg by mouth every 6 (six) hours as needed.   Inulin (FIBER CHOICE PO) Take 5 capsules by mouth at bedtime.   levothyroxine (SYNTHROID) 150 MCG tablet Take 1 tablet (150 mcg total) by mouth daily.   Melatonin 2.5 MG CAPS Take 1 capsule by mouth at bedtime.   meloxicam (MOBIC) 15 MG tablet Take 1 tablet (15 mg total) by mouth daily.   Multiple Vitamin (MULTIVITAMIN) tablet Take 1 tablet by mouth daily.   Norethindrone-Ethinyl Estradiol-Fe Biphas (LO LOESTRIN FE) 1 MG-10 MCG / 10 MCG tablet TAKE 1 TABLET BY MOUTH DAILY. CONTINUOUS DOSING   nortriptyline (PAMELOR) 10 MG capsule Take 1 capsule (10 mg total) by mouth at bedtime.   Vitamin D, Cholecalciferol, 25 MCG (1000 UT) CAPS Take 1 capsule by mouth daily.   zolpidem (AMBIEN) 5 MG tablet Take 0.5-1 tablets (2.5-5 mg total) by mouth at bedtime.   No facility-administered encounter medications on file as of 10/19/2021.   Past Surgical History:  Procedure Laterality Date   CESAREAN SECTION     COLONOSCOPY  03/27/2016   polyp, internal hemorrhoid; repeat in 3 yrs.   HEMORRHOID BANDING  POLYPECTOMY     THYROIDECTOMY  2010    Virtual Visit via MyChart Video:   I connected with Jacqueline Wilson on 10/19/21 via [place] and verified that I am speaking with the correct person using appropriate identifiers.   The limitations, risks, security and privacy concerns of performing an evaluation and management service by MyChart Video, including the higher likelihood of inaccurate diagnoses and treatments, and the availability of in person appointments were reviewed. The possible need of an additional face-to-face encounter for complete and high quality  delivery of care was discussed. The patient was also made aware that there may be a patient responsible charge related to this service. The patient expressed understanding and wishes to proceed.  Provider location is in medical facility. Patient location is at their home, different from provider location. People involved in care of the patient during this telehealth encounter were myself, my nurse/medical assistant, and my front office/scheduling team member.  Objective findings:   General: Speaking full sentences, no audible heavy breathing. Sounds alert and appropriately interactive. Well-appearing. Face symmetric. Extraocular movements intact. Pupils equal and round. No nasal flaring or accessory muscle use visualized.  Independent interpretation of notes and tests performed by another provider:   None  Pertinent History, Exam, Impression, and Recommendations:   Hypothyroidism Being actively managed by endocrinology, Dr. [name], recent labs were ordered, she plans to obtain these early next week.  We will follow peripherally on this issue.  Insomnia Longstanding issue that patient cites has been ongoing since childhood, relays multiple sleep studies, currently treated with Ambien as needed with sometimes subsequent days of sleepless nights.  Given her comorbid thyroid ongoing management, I have advised patient that we will revisit this issue once thyroid is adequately controlled.  Patient is understanding and amenable to this plan at this time.  Polyarthralgia Patient cites pain in the neck, shoulders, ribs, knee and foot/ankle.  We have previously evaluated patient for cervical paraspinal spasm with concern for upper cross syndrome.  That being said, given her elevated ferritin, inflammatory work-up to commence inclusive of autoimmune panel.  She is also considering formal physical therapy, I have advised her to contact our office so that we can send a referral accordingly to the group of  her choosing.  We will follow lab results once available.  Mixed hyperlipidemia Previously noted in the setting of elevated ALT, and subsequent elevated ferritin.  We will recheck lipid panel and follow-up accordingly.  Abnormal liver enzymes LFTs have trended upward based on EMR review where ALT noted to be elevated out of range earlier this year.  This is in the setting of hyperlipidemia and can possibly represent fatty liver.  Liver function testing labs ordered, pending results next steps can possibly include hepatic ultrasound elastography.  Elevated ferritin Incidentally noted elevated ferritin during endocrinology work-up, differential to include infection, inflammation, hepatic disease.  Patient denies any transfusions, TSAT and other labs reassuring.  Primary concern for inflammatory block with further work-up to include evaluating for chronic liver disease, sources of chronic inflammation.  Liver testing and autoimmune work-up ordered.  We will follow labs once resulted.  She plans to obtain these early next week.   Orders & Medications No orders of the defined types were placed in this encounter.  Orders Placed This Encounter  Procedures   Comprehensive metabolic panel   Gamma GT   Protime-INR   CBC with Differential/Platelet   Lipid panel   Analyzer ANA IFA w/RFLX Titer/Pattern,Systemic Autoimmune Panel 1  I discussed the above assessment and treatment plan with the patient. The patient was provided an opportunity to ask questions and all were answered. The patient agreed with the plan and demonstrated an understanding of the instructions.   The patient was advised to call back or seek an in-person evaluation if the symptoms worsen or if the condition fails to improve as anticipated.   I provided a total time of 31 minutes including both face-to-face and non-face-to-face time on 10/19/2021 inclusive of time utilized for medical chart review, information gathering, care  coordination with staff, and documentation completion.    Montel Culver, MD   Primary Care Sports Medicine [facility]

## 2021-10-22 ENCOUNTER — Other Ambulatory Visit: Payer: Self-pay | Admitting: Endocrinology

## 2021-10-23 ENCOUNTER — Telehealth: Payer: Self-pay

## 2021-10-23 ENCOUNTER — Encounter: Payer: Self-pay | Admitting: Family Medicine

## 2021-10-23 NOTE — Telephone Encounter (Signed)
Spoke to patient as she was wanting her labs to be sent to [facility] to be drawn rather than [facility] to fax the labs over or the option of picking up the requisition up at the office to take with her as we do not use [facility].  Explained to the patient that we are with [facility] since they are in our building.  The patient kept stating that Velora Heckler puts it in for her at [facility].  I explained to the patient that we are not  & that we are part of [facility] so things may be done a little differently.  Patient refused to have the lab orders faxed or to pick them up.  She stated she will seek care at [facility] going forward & to cancel the labs in the system.

## 2021-10-24 ENCOUNTER — Other Ambulatory Visit (INDEPENDENT_AMBULATORY_CARE_PROVIDER_SITE_OTHER): Payer: No Typology Code available for payment source

## 2021-10-24 ENCOUNTER — Other Ambulatory Visit: Payer: Self-pay

## 2021-10-24 DIAGNOSIS — E039 Hypothyroidism, unspecified: Secondary | ICD-10-CM | POA: Diagnosis not present

## 2021-10-25 LAB — TSH: TSH: 0.42 mIU/L

## 2021-10-25 LAB — T4, FREE: Free T4: 1.5 ng/dL (ref 0.8–1.8)

## 2021-11-24 ENCOUNTER — Other Ambulatory Visit: Payer: Self-pay | Admitting: Family Medicine

## 2021-11-24 DIAGNOSIS — G43709 Chronic migraine without aura, not intractable, without status migrainosus: Secondary | ICD-10-CM

## 2021-11-25 NOTE — Telephone Encounter (Signed)
Requested Prescriptions  Pending Prescriptions Disp Refills  . nortriptyline (PAMELOR) 10 MG capsule [Pharmacy Med Name: NORTRIPTYLINE 10MG  CAPSULES] 90 capsule 0    Sig: TAKE 1 CAPSULE(10 MG) BY MOUTH AT BEDTIME     Psychiatry:  Antidepressants - Heterocyclics (TCAs) Passed - 11/24/2021 10:00 AM      Passed - Valid encounter within last 6 months    Recent Outpatient Visits          1 month ago Elevated ferritin   [facility] Montel Culver, MD   5 months ago Constipation, unspecified constipation type   [facility] Montel Culver, MD   5 months ago Cervical paraspinal muscle spasm   [facility] Montel Culver, MD   5 months ago Excessive weight gain   [facility] Montel Culver, MD

## 2021-12-13 ENCOUNTER — Ambulatory Visit: Payer: No Typology Code available for payment source | Admitting: Adult Health

## 2021-12-18 ENCOUNTER — Ambulatory Visit (INDEPENDENT_AMBULATORY_CARE_PROVIDER_SITE_OTHER): Payer: No Typology Code available for payment source | Admitting: Adult Health

## 2021-12-18 ENCOUNTER — Other Ambulatory Visit: Payer: Self-pay

## 2021-12-18 ENCOUNTER — Other Ambulatory Visit: Payer: Self-pay | Admitting: Obstetrics and Gynecology

## 2021-12-18 ENCOUNTER — Encounter: Payer: Self-pay | Admitting: Adult Health

## 2021-12-18 VITALS — BP 116/80 | HR 79 | Temp 98.7°F | Ht 62.99 in | Wt 181.8 lb

## 2021-12-18 DIAGNOSIS — Z3009 Encounter for other general counseling and advice on contraception: Secondary | ICD-10-CM | POA: Diagnosis not present

## 2021-12-18 DIAGNOSIS — R7989 Other specified abnormal findings of blood chemistry: Secondary | ICD-10-CM | POA: Diagnosis not present

## 2021-12-18 DIAGNOSIS — G43839 Menstrual migraine, intractable, without status migrainosus: Secondary | ICD-10-CM | POA: Diagnosis not present

## 2021-12-18 DIAGNOSIS — R635 Abnormal weight gain: Secondary | ICD-10-CM | POA: Diagnosis not present

## 2021-12-18 DIAGNOSIS — E890 Postprocedural hypothyroidism: Secondary | ICD-10-CM

## 2021-12-18 LAB — POCT URINE PREGNANCY: Preg Test, Ur: NEGATIVE

## 2021-12-18 MED ORDER — LO LOESTRIN FE 1 MG-10 MCG / 10 MCG PO TABS: ORAL_TABLET | ORAL | 1 refills | Status: DC

## 2021-12-18 NOTE — Patient Instructions (Signed)

## 2021-12-18 NOTE — Progress Notes (Signed)
Negative pregnancy. Aware in office.

## 2021-12-18 NOTE — Progress Notes (Signed)
New Patient Office Visit  Subjective:  Patient ID: Jacqueline Wilson, female    DOB: Nov 04, 1981  Age: 40 y.o. MRN: [id number]  CC:  Chief Complaint  Patient presents with   New Patient (Initial Visit)    HPI [name] presents for establishment of care.  Wants refill on OCP, she has been on this for years for migraine prevention. She does have IUD - sees [name]. Filled by Dr. [name] last at [facility]. Sees [facility] within yearly.   Takes Ambien 2.5 mg on Monday and Sunday nights. She has had insomnia since age 44.  Blue light blocker glasses helping her.    Anaphylaxis allergy to alcohol, has epinephrine pen needs no refill.  Tries to drink 2/3 of gallon of water daily and miralax daily PRN.   Sees Dr. [name] for thyroid, abnormal a couple of times a year. Weight gain of 30  lbs in one year.She works out daily and consistently. She is seeing nutritionist at [facility] now.  [place] checking TSH every 3 months.   Patient  denies any fever, body aches,chills, rash, chest pain, shortness of breath, nausea, vomiting, or diarrhea.   Denies dizziness, lightheadedness, pre syncopal or syncopal episodes.      Past Medical History:  Diagnosis Date   Allergy    BRCA negative 08/2018   MyRisk neg except PALB2 VUS   Colon polyp 02/2016   colonoscopy; repeat due in 3 yrs   Family history of breast cancer 08/2018   IBIS=18.7%   Internal hemorrhoids    Migraine    without aura and menstrual migraine with aura; sees neuro   Pre-eclampsia    Thyroid disease    Thryoidectomy    Past Surgical History:  Procedure Laterality Date   CESAREAN SECTION     COLONOSCOPY  03/27/2016   polyp, internal hemorrhoid; repeat in 3 yrs.   HEMORRHOID BANDING     POLYPECTOMY     THYROIDECTOMY  2010    Family History  Problem Relation Age of Onset   Obesity Mother    Heart disease Father    COPD Father        smoker   Lung cancer Father    Thyroid disease Maternal Grandmother    Breast cancer  Maternal Grandmother 70   Colon cancer Paternal Uncle    Esophageal cancer Neg Hx    Migraines Neg Hx    Ovarian cancer Neg Hx    Colon polyps Neg Hx     Social History   Socioeconomic History   Marital status: Married    Spouse name: [name]   Number of children: 1   Years of education: 18   Highest education level: Master's degree (e.g., MA, MS, MEng, MEd, MSW, MBA)  Occupational History   Occupation: Pharmacologist    Occupation: Public librarian [employer]    Comment: OCD and related disorders  Tobacco Use   Smoking status: Never   Smokeless tobacco: Never  Vaping Use   Vaping Use: Never used  Substance and Sexual Activity   Alcohol use: Not Currently    Alcohol/week: 0.0 standard drinks   Drug use: Never   Sexual activity: Yes    Birth control/protection: I.U.D.  Other Topics Concern   Not on file  Social History Narrative   Not on file   Social Determinants of Health   Financial Resource Strain: Not on file  Food Insecurity: Not on file  Transportation Needs: Not on file  Physical Activity: Not on file  Stress: Not on file  Social Connections: Not on file  Intimate Partner Violence: Not on file    ROS Review of Systems  Constitutional:  Negative for activity change, appetite change, chills, diaphoresis, fatigue, fever and unexpected weight change (weight gain).  HENT: Negative.    Respiratory: Negative.    Cardiovascular: Negative.   Gastrointestinal: Negative.   Genitourinary: Negative.   Musculoskeletal: Negative.   Skin: Negative.   Neurological: Negative.   Hematological: Negative.   Psychiatric/Behavioral: Negative.     Objective:   Today's Vitals: BP 116/80    Pulse 79    Temp 98.7 F (37.1 C)    Ht 5' 2.99" (1.6 m)    Wt 181 lb 12.8 oz (82.5 kg)    SpO2 99%    BMI 32.21 kg/m   Physical Exam Vitals reviewed.  Constitutional:      General: She is not in acute distress.    Appearance: She is well-developed. She is not  ill-appearing, toxic-appearing or diaphoretic.     Interventions: She is not intubated. HENT:     Head: Normocephalic and atraumatic.     Right Ear: External ear normal. There is no impacted cerumen.     Left Ear: External ear normal. There is no impacted cerumen.     Nose: Nose normal. No congestion or rhinorrhea.     Mouth/Throat:     Mouth: Mucous membranes are moist.     Pharynx: No oropharyngeal exudate.  Eyes:     General: Lids are normal. No scleral icterus.       Right eye: No discharge.        Left eye: No discharge.     Conjunctiva/sclera: Conjunctivae normal.     Right eye: Right conjunctiva is not injected. No exudate or hemorrhage.    Left eye: Left conjunctiva is not injected. No exudate or hemorrhage.    Pupils: Pupils are equal, round, and reactive to light.  Neck:     Thyroid: No thyroid mass or thyromegaly.     Vascular: Normal carotid pulses. No carotid bruit, hepatojugular reflux or JVD.     Trachea: Trachea and phonation normal. No tracheal tenderness or tracheal deviation.     Meningeal: Brudzinski's sign and Kernig's sign absent.  Cardiovascular:     Rate and Rhythm: Normal rate and regular rhythm.     Pulses: Normal pulses.          Radial pulses are 2+ on the right side and 2+ on the left side.       Dorsalis pedis pulses are 2+ on the right side and 2+ on the left side.       Posterior tibial pulses are 2+ on the right side and 2+ on the left side.     Heart sounds: Normal heart sounds, S1 normal and S2 normal. Heart sounds not distant. No murmur heard.   No friction rub. No gallop.  Pulmonary:     Effort: Pulmonary effort is normal. No tachypnea, bradypnea, accessory muscle usage or respiratory distress. She is not intubated.     Breath sounds: Normal breath sounds. No stridor. No wheezing or rales.  Chest:     Chest wall: No tenderness.  Abdominal:     General: Bowel sounds are normal. There is no distension or abdominal bruit.     Palpations: Abdomen  is soft. There is no shifting dullness, fluid wave, hepatomegaly, splenomegaly, mass or pulsatile mass.     Tenderness: There is no abdominal tenderness. There  is no guarding or rebound.     Hernia: No hernia is present.  Musculoskeletal:        General: No tenderness or deformity. Normal range of motion.     Cervical back: Full passive range of motion without pain, normal range of motion and neck supple. No edema, erythema or rigidity. No spinous process tenderness or muscular tenderness. Normal range of motion.  Lymphadenopathy:     Head:     Right side of head: No submental, submandibular, tonsillar, preauricular, posterior auricular or occipital adenopathy.     Left side of head: No submental, submandibular, tonsillar, preauricular, posterior auricular or occipital adenopathy.     Cervical: No cervical adenopathy.     Right cervical: No superficial, deep or posterior cervical adenopathy.    Left cervical: No superficial, deep or posterior cervical adenopathy.     Upper Body:     Right upper body: No supraclavicular or pectoral adenopathy.     Left upper body: No supraclavicular or pectoral adenopathy.  Skin:    General: Skin is warm and dry.     Coloration: Skin is not pale.     Findings: No abrasion, bruising, burn, ecchymosis, erythema, lesion, petechiae or rash.     Nails: There is no clubbing.  Neurological:     Mental Status: She is alert and oriented to person, place, and time.     GCS: GCS eye subscore is 4. GCS verbal subscore is 5. GCS motor subscore is 6.     Cranial Nerves: No cranial nerve deficit.     Sensory: No sensory deficit.     Motor: No tremor, atrophy, abnormal muscle tone or seizure activity.     Coordination: Coordination normal.     Gait: Gait normal.     Deep Tendon Reflexes: Reflexes are normal and symmetric. Reflexes normal. Babinski sign absent on the right side. Babinski sign absent on the left side.     Reflex Scores:      Tricep reflexes are 2+ on the  right side and 2+ on the left side.      Bicep reflexes are 2+ on the right side and 2+ on the left side.      Brachioradialis reflexes are 2+ on the right side and 2+ on the left side.      Patellar reflexes are 2+ on the right side and 2+ on the left side.      Achilles reflexes are 2+ on the right side and 2+ on the left side. Psychiatric:        Speech: Speech normal.        Behavior: Behavior normal.        Thought Content: Thought content normal.        Judgment: Judgment normal.    Assessment & Plan:   Problem List Items Addressed This Visit       Cardiovascular and Mediastinum   Intractable menstrual migraine without status migrainosus   Relevant Medications   Norethindrone-Ethinyl Estradiol-Fe Biphas (LO LOESTRIN FE) 1 MG-10 MCG / 10 MCG tablet     Other   Birth control counseling   Relevant Medications   Norethindrone-Ethinyl Estradiol-Fe Biphas (LO LOESTRIN FE) 1 MG-10 MCG / 10 MCG tablet   Other Relevant Orders   CBC with Differential/Platelet   Comprehensive metabolic panel   Lipid panel   POCT urine pregnancy (Completed)   Weight gain   Relevant Orders   CBC with Differential/Platelet   Comprehensive metabolic panel   Lipid  panel   H/O total thyroidectomy   Elevated ferritin - Primary   Relevant Orders   Analyzer ANA IFA w/RFLX Titer/Pattern,Systemic Autoimmune Panel 1   Gamma GT   Protime-INR   Iron, TIBC and Ferritin Panel (Completed)    Outpatient Encounter Medications as of 12/18/2021  Medication Sig   albuterol (VENTOLIN HFA) 108 (90 Base) MCG/ACT inhaler TAKE 2 PUFFS BY MOUTH EVERY 4-6 HOURS AS NEEDED FOR WHEEZE OR SHORTNESS OF BREATH   cetirizine (ZYRTEC) 10 MG tablet Take 10 mg by mouth daily.   EPINEPHrine 0.3 mg/0.3 mL IJ SOAJ injection Inject 0.3 mg into the muscle as needed for anaphylaxis.   fluticasone (FLOVENT HFA) 110 MCG/ACT inhaler Inhale 2 puffs twice a day with spacer to help prevent cough and wheeze.   ibuprofen (ADVIL) 200 MG  tablet Take 400 mg by mouth every 6 (six) hours as needed.   Inulin (FIBER CHOICE PO) Take 5 capsules by mouth at bedtime.   levothyroxine (SYNTHROID) 150 MCG tablet TAKE 1 TABLET(150 MCG) BY MOUTH DAILY   Melatonin 2.5 MG CAPS Take 1 capsule by mouth at bedtime.   meloxicam (MOBIC) 15 MG tablet Take 1 tablet (15 mg total) by mouth daily.   Multiple Vitamin (MULTIVITAMIN) tablet Take 1 tablet by mouth daily.   Norethindrone-Ethinyl Estradiol-Fe Biphas (LO LOESTRIN FE) 1 MG-10 MCG / 10 MCG tablet TAKE 1 TABLET BY MOUTH DAILY. CONTINUOUS DOSING   nortriptyline (PAMELOR) 10 MG capsule TAKE 1 CAPSULE(10 MG) BY MOUTH AT BEDTIME   Vitamin D, Cholecalciferol, 25 MCG (1000 UT) CAPS Take 1 capsule by mouth daily.   zolpidem (AMBIEN) 5 MG tablet Take 0.5-1 tablets (2.5-5 mg total) by mouth at bedtime.   [DISCONTINUED] Norethindrone-Ethinyl Estradiol-Fe Biphas (LO LOESTRIN FE) 1 MG-10 MCG / 10 MCG tablet TAKE 1 TABLET BY MOUTH DAILY. CONTINUOUS DOSING   No facility-administered encounter medications on file as of 12/18/2021.   Recommend hr follow up with her gynecology due to her IUD. She agrees and will call them fr appointment in 1- 2 weeks.   Meds ordered this encounter  Medications   Norethindrone-Ethinyl Estradiol-Fe Biphas (LO LOESTRIN FE) 1 MG-10 MCG / 10 MCG tablet    Sig: TAKE 1 TABLET BY MOUTH DAILY. CONTINUOUS DOSING    Dispense:  84 tablet    Refill:  1     Red Flags discussed. The patient was given clear instructions to go to ER or return to medical center if any red flags develop, symptoms do not improve, worsen or new problems develop. They verbalized understanding.  Cpe and labs within weeks.  Follow-up: Return in about 3 months (around 03/18/2022), or if symptoms worsen or fail to improve, for at any time for any worsening symptoms, Go to Emergency room/ urgent care if worse.   Marcille Buffy, FNP

## 2021-12-20 ENCOUNTER — Other Ambulatory Visit (INDEPENDENT_AMBULATORY_CARE_PROVIDER_SITE_OTHER): Payer: No Typology Code available for payment source

## 2021-12-20 ENCOUNTER — Other Ambulatory Visit: Payer: Self-pay

## 2021-12-20 DIAGNOSIS — Z3009 Encounter for other general counseling and advice on contraception: Secondary | ICD-10-CM

## 2021-12-20 DIAGNOSIS — R7989 Other specified abnormal findings of blood chemistry: Secondary | ICD-10-CM | POA: Diagnosis not present

## 2021-12-20 DIAGNOSIS — R635 Abnormal weight gain: Secondary | ICD-10-CM

## 2021-12-21 ENCOUNTER — Encounter: Payer: Self-pay | Admitting: Adult Health

## 2021-12-21 ENCOUNTER — Telehealth: Payer: Self-pay

## 2021-12-21 LAB — COMPLETE METABOLIC PANEL WITH GFR
AG Ratio: 1.7 (calc) (ref 1.0–2.5)
ALT: 16 U/L (ref 6–29)
AST: 15 U/L (ref 10–30)
Albumin: 4.2 g/dL (ref 3.6–5.1)
Alkaline phosphatase (APISO): 66 U/L (ref 31–125)
BUN: 14 mg/dL (ref 7–25)
CO2: 24 mmol/L (ref 20–32)
Calcium: 9.1 mg/dL (ref 8.6–10.2)
Chloride: 106 mmol/L (ref 98–110)
Creat: 0.84 mg/dL (ref 0.50–0.99)
Globulin: 2.5 g/dL (calc) (ref 1.9–3.7)
Glucose, Bld: 89 mg/dL (ref 65–99)
Potassium: 4.4 mmol/L (ref 3.5–5.3)
Sodium: 139 mmol/L (ref 135–146)
Total Bilirubin: 0.5 mg/dL (ref 0.2–1.2)
Total Protein: 6.7 g/dL (ref 6.1–8.1)
eGFR: 90 mL/min/{1.73_m2} (ref >=60)

## 2021-12-21 LAB — CBC WITH DIFFERENTIAL/PLATELET
Absolute Monocytes: 269 cells/uL (ref 200–950)
Basophils Absolute: 28 cells/uL (ref 0–200)
Basophils Relative: 0.5 %
Eosinophils Absolute: 140 cells/uL (ref 15–500)
Eosinophils Relative: 2.5 %
HCT: 43.7 % (ref 35.0–45.0)
Hemoglobin: 14.9 g/dL (ref 11.7–15.5)
Lymphs Abs: 1568 cells/uL (ref 850–3900)
MCH: 30.6 pg (ref 27.0–33.0)
MCHC: 34.1 g/dL (ref 32.0–36.0)
MCV: 89.7 fL (ref 80.0–100.0)
MPV: 10.4 fL (ref 7.5–12.5)
Monocytes Relative: 4.8 %
Neutro Abs: 3595 cells/uL (ref 1500–7800)
Neutrophils Relative %: 64.2 %
Platelets: 270 10*3/uL (ref 140–400)
RBC: 4.87 10*6/uL (ref 3.80–5.10)
RDW: 12.6 % (ref 11.0–15.0)
Total Lymphocyte: 28 %
WBC: 5.6 10*3/uL (ref 3.8–10.8)

## 2021-12-21 LAB — LIPID PANEL
Cholesterol: 279 mg/dL — ABNORMAL HIGH (ref <200)
HDL: 47 mg/dL — ABNORMAL LOW (ref >=50)
LDL Cholesterol (Calc): 173 mg/dL (calc) — ABNORMAL HIGH
Non-HDL Cholesterol (Calc): 232 mg/dL (calc) — ABNORMAL HIGH (ref <130)
Total CHOL/HDL Ratio: 5.9 (calc) — ABNORMAL HIGH (ref <5.0)
Triglycerides: 346 mg/dL — ABNORMAL HIGH (ref <150)

## 2021-12-21 LAB — GAMMA GT: GGT: 20 U/L (ref 3–55)

## 2021-12-21 LAB — PROTIME-INR
INR: 0.9
Prothrombin Time: 9.5 s (ref 9.0–11.5)

## 2021-12-21 LAB — IRON,TIBC AND FERRITIN PANEL
%SAT: 29 % (calc) (ref 16–45)
Ferritin: 281 ng/mL — ABNORMAL HIGH (ref 16–154)
Iron: 110 ug/dL (ref 40–190)
TIBC: 376 mcg/dL (calc) (ref 250–450)

## 2021-12-21 NOTE — Progress Notes (Signed)
INR  and PT is within normal limits.  Gamma GT negative.  CBC, CMP within normal limits.  Iron TIBC and iron saturation within normal limits, ferritin still elevated, referral to hematology advised for further work up if she is in agreement let me know.   Total cholesterol, triglycerides and LDL very elevated.  Discuss lifestyle modification with patient e.g. increase exercise, fiber, fruits, vegetables, lean meat, and omega 3/fish intake and decrease saturated fat.  If patient following strict diet and exercise program already please schedule follow up appointment with primary care physician and recommend doing a statin cholesterol lowering medication since her cholesterol is extremely high. Please let me know her decision on this. Verify she was truly fastin

## 2021-12-21 NOTE — Telephone Encounter (Signed)
LMTCB for lab results.  

## 2021-12-24 ENCOUNTER — Other Ambulatory Visit: Payer: Self-pay | Admitting: Adult Health

## 2021-12-24 ENCOUNTER — Encounter: Payer: Self-pay | Admitting: Adult Health

## 2021-12-24 DIAGNOSIS — R5382 Chronic fatigue, unspecified: Secondary | ICD-10-CM

## 2021-12-24 DIAGNOSIS — R7989 Other specified abnormal findings of blood chemistry: Secondary | ICD-10-CM

## 2021-12-24 DIAGNOSIS — E785 Hyperlipidemia, unspecified: Secondary | ICD-10-CM

## 2021-12-24 MED ORDER — ROSUVASTATIN CALCIUM 5 MG PO TABS
5.0000 mg | ORAL_TABLET | Freq: Every day | ORAL | 3 refills | Status: DC
Start: 2021-12-24 — End: 2022-01-16

## 2021-12-24 NOTE — Progress Notes (Signed)
Referral placed for hematology and if not heard from them in 2 weeks let us knomw. Crestor 5 mg my mouth once daily. Report any unusual muscle aches or pains. Recheck CMP 6 weeks after starting Crestor. Need to schedule.  Recheck lipid panel in 3 months.

## 2021-12-24 NOTE — Progress Notes (Signed)
Orders Placed This Encounter  Procedures   Comp Met (CMET)    Standing Status:   Future    Standing Expiration Date:   12/24/2022   Ambulatory referral to Hematology / Oncology    Referral Priority:   Routine    Referral Type:   Consultation    Referral Reason:   Specialty Services Required    Requested Specialty:   Oncology    Number of Visits Requested:   1    CMP in 6 weeks.   Meds ordered this encounter  Medications   rosuvastatin (CRESTOR) 5 MG tablet    Sig: Take 1 tablet (5 mg total) by mouth daily.    Dispense:  90 tablet    Refill:  3

## 2021-12-26 ENCOUNTER — Encounter: Payer: Self-pay | Admitting: Adult Health

## 2021-12-26 NOTE — Progress Notes (Signed)
Please have her check Mychart sent information on Crestor.  She can try medication or try diet and exercise for 3 months and schedule a lipid panel recheck lab at that time.  If she starts Crestor she will need recheck of CMP in 4-6 weeks after starting to  monitor liver.

## 2021-12-27 NOTE — Progress Notes (Signed)
Noted thank you

## 2022-01-04 LAB — ANALYZER(R)ANA IFA WITH REFLEX TITER/PATTRN,SYS AUTOIMM PNL1
14-3-3 eta Protein: <0.2 ng/mL (ref <0.2)
Anti Nuclear Antibody (ANA): NEGATIVE
Anticardiolipin IgA: <2 APL-U/mL
Anticardiolipin IgG: <2 GPL-U/mL
Anticardiolipin IgM: <2 MPL-U/mL
Beta-2 Glyco 1 IgA: 2 U/mL
Beta-2 Glyco 1 IgM: <2 U/mL
Beta-2 Glyco I IgG: <2 U/mL
C3 Complement: 167 mg/dL (ref 83–193)
C4 Complement: 49 mg/dL (ref 15–57)
Centromere Ab Screen: <1 AI
Chromatin (Nucleosomal) Antibody: <1 AI
Cyclic Citrullin Peptide Ab: <16 Units
DNA Ab (DS) Crithidia, IFA: NEGATIVE
ENA SM Ab Ser-aCnc: <1 AI
Jo-1 Autoabs: <1 AI
Rheumatoid Factor (IgA): <5 U
Rheumatoid Factor (IgG): <5 U
Rheumatoid Factor (IgM): 7 U — ABNORMAL HIGH
Ribonucleic Protein(ENA) Antibody, IgG: 3.2 AI — AB
SM/RNP: <1 AI
SSA (Ro) (ENA) Antibody, IgG: <1 AI
SSB (La) (ENA) Antibody, IgG: <1 AI
Scleroderma (Scl-70) (ENA) Antibody, IgG: <1 AI
Thyroperoxidase Ab SerPl-aCnc: 1 IU/mL (ref <9)

## 2022-01-06 NOTE — Progress Notes (Signed)
ANA is negative.  Ribonucleic protein is positive as well as rheumatoid factor slightly positive. Would recommend seeing rheumatology to rule out connective tissue disease possibly. We can wait until you see hematology if prefer.

## 2022-01-07 ENCOUNTER — Encounter: Payer: Self-pay | Admitting: *Deleted

## 2022-01-15 NOTE — Progress Notes (Signed)
[facility]  [phone] [phone]  ID: Jacqueline Wilson OB: 09/16/1981  MR#: [id number]  KCL#:[id number]  Patient Care Team: Jacqueline Beam, FNP as PCP - General (Family Medicine) Jacqueline Wilson, [place] Grayland Ormond, Jacqueline November, MD as Consulting Physician (Hematology and Oncology)  CHIEF COMPLAINT: Elevated ferritin.  INTERVAL HISTORY: Patient is a 41 year old female who was noted to have an increased ferritin level on routine blood work.  Repeat laboratory work confirmed the results.  She currently feels well and is asymptomatic.  There is no neurologic complaints.  She denies any recent fevers or illnesses.  She has a good appetite and denies weight loss.  She has no chest pain, shortness of breath, cough, or hemoptysis.  She denies any nausea, vomiting, constipation, or diarrhea.  She has no urinary complaints.  Patient feels at her baseline and offers no specific complaints today.  REVIEW OF SYSTEMS:   Review of Systems  Constitutional: Negative.  Negative for fever, malaise/fatigue and weight loss.  Respiratory: Negative.  Negative for cough, hemoptysis and shortness of breath.   Cardiovascular: Negative.  Negative for chest pain and leg swelling.  Gastrointestinal: Negative.  Negative for abdominal pain.  Genitourinary: Negative.  Negative for dysuria.  Musculoskeletal: Negative.  Negative for back pain.  Skin: Negative.  Negative for rash.  Neurological: Negative.  Negative for dizziness, focal weakness, weakness and headaches.  Psychiatric/Behavioral: Negative.  The patient is not nervous/anxious.    As per HPI. Otherwise, a complete review of systems is negative.  PAST MEDICAL HISTORY: Past Medical History:  Diagnosis Date   Allergy    BRCA negative 08/2018   MyRisk neg except PALB2 VUS   Colon polyp 02/2016   colonoscopy; repeat due in 3 yrs   Elevated ferritin    Family history of breast cancer 08/2018   IBIS=18.7%   History  of goiter    thryoidectomy   Hyperlipidemia    Hypothyroidism    Internal hemorrhoids    Intracranial hypertension 12/31/2011   Migraine    without aura and menstrual migraine with aura; sees neuro   Pre-eclampsia    Thyroid disease    Thryoidectomy    PAST SURGICAL HISTORY: Past Surgical History:  Procedure Laterality Date   CESAREAN SECTION     COLONOSCOPY  03/27/2016   polyp, internal hemorrhoid; repeat in 3 yrs.   HEMORRHOID BANDING     POLYPECTOMY     THYROIDECTOMY  2010   treat benign goiter    FAMILY HISTORY: Family History  Problem Relation Age of Onset   Obesity Mother    Heart disease Father    COPD Father        smoker   Lung cancer Father    Lung cancer Paternal Uncle    Colon cancer Paternal Uncle    Thyroid disease Maternal Grandmother    Breast cancer Maternal Grandmother 40   Esophageal cancer Neg Hx    Migraines Neg Hx    Ovarian cancer Neg Hx    Colon polyps Neg Hx     ADVANCED DIRECTIVES (Y/N):  N  HEALTH MAINTENANCE: Social History   Tobacco Use   Smoking status: Never    Passive exposure: Never   Smokeless tobacco: Never  Vaping Use   Vaping Use: Never used  Substance Use Topics   Alcohol use: Not Currently    Alcohol/week: 0.0 standard drinks   Drug use: Never     Colonoscopy:  PAP:  Bone density:  Lipid panel:  Allergies  Allergen Reactions   Flonase [Fluticasone Propionate] Anaphylaxis    Has alcohol in nasal spray   Hydrocodone-Acetaminophen Other (See Comments)    Suicidal    Latex Anaphylaxis   Rubbing Alcohol [Alcohol] Anaphylaxis   Topamax [Topiramate] Other (See Comments)    HI/SI   Bean Pod Extract Swelling    Butter Bean, Baked Bean: Facial Swelling   Hydrocodone     Suicidal thoughts with medication   Mucinex [Guaifenesin Er] Other (See Comments)    Lose hearing for 1-2 weeks.   Penicillins    Cephalexin Rash    Current Outpatient Medications  Medication Sig Dispense Refill   albuterol (VENTOLIN  HFA) 108 (90 Base) MCG/ACT inhaler TAKE 2 PUFFS BY MOUTH EVERY 4-6 HOURS AS NEEDED FOR WHEEZE OR SHORTNESS OF BREATH 18 g 1   cetirizine (ZYRTEC) 10 MG tablet Take 10 mg by mouth daily.     fluticasone (FLOVENT HFA) 110 MCG/ACT inhaler Inhale 2 puffs twice a day with spacer to help prevent cough and wheeze. 1 each 3   ibuprofen (ADVIL) 200 MG tablet Take 400 mg by mouth every 6 (six) hours as needed.     Inulin (FIBER CHOICE PO) Take 5 capsules by mouth at bedtime.     levothyroxine (SYNTHROID) 150 MCG tablet TAKE 1 TABLET(150 MCG) BY MOUTH DAILY 30 tablet 3   Melatonin 2.5 MG CAPS Take 1 capsule by mouth at bedtime.     meloxicam (MOBIC) 15 MG tablet Take 1 tablet (15 mg total) by mouth daily. 30 tablet 0   Multiple Vitamin (MULTIVITAMIN) tablet Take 1 tablet by mouth daily.     Norethindrone-Ethinyl Estradiol-Fe Biphas (LO LOESTRIN FE) 1 MG-10 MCG / 10 MCG tablet TAKE 1 TABLET BY MOUTH DAILY. CONTINUOUS DOSING 84 tablet 1   nortriptyline (PAMELOR) 10 MG capsule TAKE 1 CAPSULE(10 MG) BY MOUTH AT BEDTIME 90 capsule 0   Vitamin D, Cholecalciferol, 25 MCG (1000 UT) CAPS Take 1 capsule by mouth daily.     zolpidem (AMBIEN) 5 MG tablet Take 0.5-1 tablets (2.5-5 mg total) by mouth at bedtime. 90 tablet 0   EPINEPHrine 0.3 mg/0.3 mL IJ SOAJ injection Inject 0.3 mg into the muscle as needed for anaphylaxis. (Patient not taking: Reported on 01/16/2022) 1 each 0   No current facility-administered medications for this visit.    OBJECTIVE: Vitals:   01/16/22 1130  BP: 114/80  Pulse: 84  Resp: 16  Temp: 98 F (36.7 C)  SpO2: 95%     Body mass index is 32.43 kg/m.    ECOG FS:0 - Asymptomatic  General: Well-developed, well-nourished, no acute distress. Eyes: Pink conjunctiva, anicteric sclera. HEENT: Normocephalic, moist mucous membranes. Lungs: No audible wheezing or coughing. Heart: Regular rate and rhythm. Abdomen: Soft, nontender, no obvious distention. Musculoskeletal: No edema, cyanosis,  or clubbing. Neuro: Alert, answering all questions appropriately. Cranial nerves grossly intact. Skin: No rashes or petechiae noted. Psych: Normal affect. Lymphatics: No cervical, calvicular, axillary or inguinal LAD.   LAB RESULTS:  Lab Results  Component Value Date   NA 139 12/20/2021   K 4.4 12/20/2021   CL 106 12/20/2021   CO2 24 12/20/2021   GLUCOSE 89 12/20/2021   BUN 14 12/20/2021   CREATININE 0.84 12/20/2021   CALCIUM 9.1 12/20/2021   PROT 6.7 12/20/2021   AST 15 12/20/2021   ALT 16 12/20/2021   BILITOT 0.5 12/20/2021   GFRNONAA >90 02/04/2012   GFRAA >90 02/04/2012    Lab Results  Component  Value Date  ° WBC 5.6 12/20/2021  ° NEUTROABS 3,595 12/20/2021  ° HGB 14.9 12/20/2021  ° HCT 43.7 12/20/2021  ° MCV 89.7 12/20/2021  ° PLT 270 12/20/2021  ° °Lab Results  °Component Value Date  ° IRON 110 12/20/2021  ° TIBC 376 12/20/2021  ° IRONPCTSAT 29 12/20/2021  ° °Lab Results  °Component Value Date  ° FERRITIN 281 (H) 12/20/2021  ° ° ° °STUDIES: °No results found. ° °ASSESSMENT: Elevated ferritin. ° °PLAN:   ° °Elevated ferritin: Likely secondary to an acute phase reactant.  Patient's most recent ferritin remains elevated at 281, but this is trended down.  Today's results are pending.  The remainder of her iron panel was within normal limits.  Have ordered hemochromatosis gene mutation for completeness.  No intervention is needed at this time.  Patient will have video-assisted telemedicine visit in 3 weeks to discuss the results. ° °I spent a total of 45 minutes reviewing chart data, face-to-face evaluation with the patient, counseling and coordination of care as detailed above. ° ° °Patient expressed understanding and was in agreement with this plan. She also understands that She can call clinic at any time with any questions, concerns, or complaints.  ° ° °Timothy J Finnegan, MD   01/17/2022 6:57 AM ° ° ° ° °

## 2022-01-16 ENCOUNTER — Inpatient Hospital Stay: Payer: No Typology Code available for payment source

## 2022-01-16 ENCOUNTER — Inpatient Hospital Stay: Payer: No Typology Code available for payment source | Attending: Oncology | Admitting: Oncology

## 2022-01-16 ENCOUNTER — Other Ambulatory Visit: Payer: Self-pay

## 2022-01-16 ENCOUNTER — Encounter: Payer: Self-pay | Admitting: Oncology

## 2022-01-16 VITALS — BP 114/80 | HR 84 | Temp 98.0°F | Resp 16 | Wt 183.0 lb

## 2022-01-16 DIAGNOSIS — Z800 Family history of malignant neoplasm of digestive organs: Secondary | ICD-10-CM | POA: Insufficient documentation

## 2022-01-16 DIAGNOSIS — Z801 Family history of malignant neoplasm of trachea, bronchus and lung: Secondary | ICD-10-CM | POA: Diagnosis not present

## 2022-01-16 DIAGNOSIS — Z803 Family history of malignant neoplasm of breast: Secondary | ICD-10-CM | POA: Diagnosis not present

## 2022-01-16 DIAGNOSIS — R7989 Other specified abnormal findings of blood chemistry: Secondary | ICD-10-CM

## 2022-01-16 DIAGNOSIS — G932 Benign intracranial hypertension: Secondary | ICD-10-CM

## 2022-01-16 NOTE — Progress Notes (Signed)
Pt left prior to labs being drawn because she had to return to work. Lab orders faxed to PCP, per pt request

## 2022-01-16 NOTE — Progress Notes (Signed)
New patient referred for elevated ferritin levels.  Pt states the only change she has noticed is insomnia.

## 2022-02-01 ENCOUNTER — Encounter: Payer: Self-pay | Admitting: Oncology

## 2022-02-04 ENCOUNTER — Inpatient Hospital Stay: Payer: No Typology Code available for payment source | Attending: Oncology

## 2022-02-04 ENCOUNTER — Other Ambulatory Visit: Payer: Self-pay

## 2022-02-04 DIAGNOSIS — R7989 Other specified abnormal findings of blood chemistry: Secondary | ICD-10-CM | POA: Insufficient documentation

## 2022-02-04 LAB — CBC WITH DIFFERENTIAL/PLATELET
Abs Immature Granulocytes: 0.07 10*3/uL (ref 0.00–0.07)
Basophils Absolute: 0 10*3/uL (ref 0.0–0.1)
Basophils Relative: 0 %
Eosinophils Absolute: 0.1 10*3/uL (ref 0.0–0.5)
Eosinophils Relative: 2 %
HCT: 39.7 % (ref 36.0–46.0)
Hemoglobin: 13.6 g/dL (ref 12.0–15.0)
Immature Granulocytes: 1 %
Lymphocytes Relative: 35 %
Lymphs Abs: 2.3 10*3/uL (ref 0.7–4.0)
MCH: 30.8 pg (ref 26.0–34.0)
MCHC: 34.3 g/dL (ref 30.0–36.0)
MCV: 89.8 fL (ref 80.0–100.0)
Monocytes Absolute: 0.4 10*3/uL (ref 0.1–1.0)
Monocytes Relative: 6 %
Neutro Abs: 3.8 10*3/uL (ref 1.7–7.7)
Neutrophils Relative %: 56 %
Platelets: 255 10*3/uL (ref 150–400)
RBC: 4.42 MIL/uL (ref 3.87–5.11)
RDW: 11.7 % (ref 11.5–15.5)
WBC: 6.7 10*3/uL (ref 4.0–10.5)
nRBC: 0 % (ref 0.0–0.2)

## 2022-02-04 LAB — FERRITIN: Ferritin: 292 ng/mL (ref 11–307)

## 2022-02-04 LAB — IRON AND TIBC
Iron: 96 ug/dL (ref 28–170)
Saturation Ratios: 26 % (ref 10.4–31.8)
TIBC: 377 ug/dL (ref 250–450)
UIBC: 281 ug/dL

## 2022-02-11 NOTE — Progress Notes (Signed)
[place]  [phone] [phone]  ID: Jacqueline Wilson OB: Sep 28, 1981  MR#: [id number]  UMP#:[id number]  Patient Care Team: Doreen Beam, FNP as PCP - General (Family Medicine) [facility], [place] Ormond, Kathlene November, MD as Consulting Physician (Hematology and Oncology)  I connected with Jacqueline Wilson on 02/12/22 at  3:30 PM EST by video enabled telemedicine visit and verified that I am speaking with the correct person using two identifiers.   I discussed the limitations, risks, security and privacy concerns of performing an evaluation and management service by telemedicine and the availability of in-person appointments. I also discussed with the patient that there may be a patient responsible charge related to this service. The patient expressed understanding and agreed to proceed.   Other persons participating in the visit and their role in the encounter: Patient, MD.  Patients location: Home. Providers location: Clinic.  CHIEF COMPLAINT: Elevated ferritin.  INTERVAL HISTORY: Patient agreed to video assisted telemedicine visit for further evaluation and discussion of her laboratory work.  She continues to feel well and remains asymptomatic.  She has no neurologic complaints.  She denies any recent fevers or illnesses.  She has a good appetite and denies weight loss.  She has no chest pain, shortness of breath, cough, or hemoptysis.  She denies any nausea, vomiting, constipation, or diarrhea.  She has no urinary complaints.  Patient offers no specific complaints today.  REVIEW OF SYSTEMS:   Review of Systems  Constitutional: Negative.  Negative for fever, malaise/fatigue and weight loss.  Respiratory: Negative.  Negative for cough, hemoptysis and shortness of breath.   Cardiovascular: Negative.  Negative for chest pain and leg swelling.  Gastrointestinal: Negative.  Negative for abdominal pain.  Genitourinary: Negative.  Negative for  dysuria.  Musculoskeletal: Negative.  Negative for back pain.  Skin: Negative.  Negative for rash.  Neurological: Negative.  Negative for dizziness, focal weakness, weakness and headaches.  Psychiatric/Behavioral: Negative.  The patient is not nervous/anxious.    As per HPI. Otherwise, a complete review of systems is negative.  PAST MEDICAL HISTORY: Past Medical History:  Diagnosis Date   Allergy    BRCA negative 08/2018   MyRisk neg except PALB2 VUS   Colon polyp 02/2016   colonoscopy; repeat due in 3 yrs   Elevated ferritin    Family history of breast cancer 08/2018   IBIS=18.7%   History of goiter    thryoidectomy   Hyperlipidemia    Hypothyroidism    Internal hemorrhoids    Intracranial hypertension 12/31/2011   Migraine    without aura and menstrual migraine with aura; sees neuro   Pre-eclampsia    Thyroid disease    Thryoidectomy    PAST SURGICAL HISTORY: Past Surgical History:  Procedure Laterality Date   CESAREAN SECTION     COLONOSCOPY  03/27/2016   polyp, internal hemorrhoid; repeat in 3 yrs.   HEMORRHOID BANDING     POLYPECTOMY     THYROIDECTOMY  2010   treat benign goiter    FAMILY HISTORY: Family History  Problem Relation Age of Onset   Obesity Mother    Heart disease Father    COPD Father        smoker   Lung cancer Father    Lung cancer Paternal Uncle    Colon cancer Paternal Uncle    Thyroid disease Maternal Grandmother    Breast cancer Maternal Grandmother 40   Esophageal cancer Neg Hx    Migraines Neg Hx  Ovarian cancer Neg Hx    Colon polyps Neg Hx     ADVANCED DIRECTIVES (Y/N):  N  HEALTH MAINTENANCE: Social History   Tobacco Use   Smoking status: Never    Passive exposure: Never   Smokeless tobacco: Never  Vaping Use   Vaping Use: Never used  Substance Use Topics   Alcohol use: Not Currently    Alcohol/week: 0.0 standard drinks   Drug use: Never     Colonoscopy:  PAP:  Bone density:  Lipid panel:  Allergies   Allergen Reactions   Flonase [Fluticasone Propionate] Anaphylaxis    Has alcohol in nasal spray   Hydrocodone-Acetaminophen Other (See Comments)    Suicidal    Latex Anaphylaxis   Rubbing Alcohol [Alcohol] Anaphylaxis   Topamax [Topiramate] Other (See Comments)    HI/SI   Bean Pod Extract Swelling    Butter Bean, Baked Bean: Facial Swelling   Hydrocodone     Suicidal thoughts with medication   Mucinex [Guaifenesin Er] Other (See Comments)    Lose hearing for 1-2 weeks.   Penicillins    Cephalexin Rash    Current Outpatient Medications  Medication Sig Dispense Refill   albuterol (VENTOLIN HFA) 108 (90 Base) MCG/ACT inhaler TAKE 2 PUFFS BY MOUTH EVERY 4-6 HOURS AS NEEDED FOR WHEEZE OR SHORTNESS OF BREATH 18 g 1   cetirizine (ZYRTEC) 10 MG tablet Take 10 mg by mouth daily.     EPINEPHrine 0.3 mg/0.3 mL IJ SOAJ injection Inject 0.3 mg into the muscle as needed for anaphylaxis. (Patient not taking: Reported on 01/16/2022) 1 each 0   fluticasone (FLOVENT HFA) 110 MCG/ACT inhaler Inhale 2 puffs twice a day with spacer to help prevent cough and wheeze. 1 each 3   ibuprofen (ADVIL) 200 MG tablet Take 400 mg by mouth every 6 (six) hours as needed.     Inulin (FIBER CHOICE PO) Take 5 capsules by mouth at bedtime.     levothyroxine (SYNTHROID) 150 MCG tablet TAKE 1 TABLET(150 MCG) BY MOUTH DAILY 30 tablet 3   Melatonin 2.5 MG CAPS Take 1 capsule by mouth at bedtime.     meloxicam (MOBIC) 15 MG tablet Take 1 tablet (15 mg total) by mouth daily. 30 tablet 0   Multiple Vitamin (MULTIVITAMIN) tablet Take 1 tablet by mouth daily.     Norethindrone-Ethinyl Estradiol-Fe Biphas (LO LOESTRIN FE) 1 MG-10 MCG / 10 MCG tablet TAKE 1 TABLET BY MOUTH DAILY. CONTINUOUS DOSING 84 tablet 1   nortriptyline (PAMELOR) 10 MG capsule TAKE 1 CAPSULE(10 MG) BY MOUTH AT BEDTIME 90 capsule 0   Vitamin D, Cholecalciferol, 25 MCG (1000 UT) CAPS Take 1 capsule by mouth daily.     zolpidem (AMBIEN) 5 MG tablet Take 0.5-1  tablets (2.5-5 mg total) by mouth at bedtime. 90 tablet 0   No current facility-administered medications for this visit.    OBJECTIVE: There were no vitals filed for this visit.    There is no height or weight on file to calculate BMI.    ECOG FS:0 - Asymptomatic  General: Well-developed, well-nourished, no acute distress. HEENT: Normocephalic. Neuro: Alert, answering all questions appropriately. Cranial nerves grossly intact. Psych: Normal affect.  LAB RESULTS:  Lab Results  Component Value Date   NA 139 12/20/2021   K 4.4 12/20/2021   CL 106 12/20/2021   CO2 24 12/20/2021   GLUCOSE 89 12/20/2021   BUN 14 12/20/2021   CREATININE 0.84 12/20/2021   CALCIUM 9.1 12/20/2021   PROT 6.7  12/20/2021   AST 15 12/20/2021   ALT 16 12/20/2021   BILITOT 0.5 12/20/2021   GFRNONAA >90 02/04/2012   GFRAA >90 02/04/2012    Lab Results  Component Value Date   WBC 6.7 02/04/2022   NEUTROABS 3.8 02/04/2022   HGB 13.6 02/04/2022   HCT 39.7 02/04/2022   MCV 89.8 02/04/2022   PLT 255 02/04/2022   Lab Results  Component Value Date   IRON 96 02/04/2022   TIBC 377 02/04/2022   IRONPCTSAT 26 02/04/2022   Lab Results  Component Value Date   FERRITIN 292 02/04/2022     STUDIES: No results found.  ASSESSMENT: Elevated ferritin.  PLAN:    Elevated ferritin: Resolved.  Possibly secondary to an acute phase reactant.  The remainder of her laboratory work is also within normal limits.  Hemochromatosis gene mutation was tested previously and reported as negative, although we do not have these results in hand.  No intervention is needed at this time.  No further follow-up as a scheduled.  Please refer patient back if there are any questions or concerns.    I provided 20 minutes of face-to-face video visit time during this encounter which included chart review, counseling, and coordination of care as documented above.   Patient expressed understanding and was in agreement with this plan.  She also understands that She can call clinic at any time with any questions, concerns, or complaints.    Lloyd Huger, MD   02/12/2022 3:47 PM

## 2022-02-12 ENCOUNTER — Inpatient Hospital Stay (HOSPITAL_BASED_OUTPATIENT_CLINIC_OR_DEPARTMENT_OTHER): Payer: No Typology Code available for payment source | Admitting: Oncology

## 2022-02-12 DIAGNOSIS — R7989 Other specified abnormal findings of blood chemistry: Secondary | ICD-10-CM | POA: Diagnosis not present

## 2022-03-19 ENCOUNTER — Ambulatory Visit: Payer: No Typology Code available for payment source | Admitting: Adult Health

## 2022-04-08 ENCOUNTER — Encounter: Payer: Self-pay | Admitting: Family

## 2022-04-08 ENCOUNTER — Ambulatory Visit (INDEPENDENT_AMBULATORY_CARE_PROVIDER_SITE_OTHER): Payer: No Typology Code available for payment source | Admitting: Family

## 2022-04-08 VITALS — BP 118/76 | HR 89 | Temp 98.7°F | Resp 16 | Ht 63.0 in | Wt 185.2 lb

## 2022-04-08 DIAGNOSIS — E611 Iron deficiency: Secondary | ICD-10-CM

## 2022-04-08 DIAGNOSIS — G43709 Chronic migraine without aura, not intractable, without status migrainosus: Secondary | ICD-10-CM

## 2022-04-08 DIAGNOSIS — G932 Benign intracranial hypertension: Secondary | ICD-10-CM | POA: Diagnosis not present

## 2022-04-08 DIAGNOSIS — E782 Mixed hyperlipidemia: Secondary | ICD-10-CM | POA: Diagnosis not present

## 2022-04-08 DIAGNOSIS — T782XXA Anaphylactic shock, unspecified, initial encounter: Secondary | ICD-10-CM

## 2022-04-08 DIAGNOSIS — E039 Hypothyroidism, unspecified: Secondary | ICD-10-CM

## 2022-04-08 DIAGNOSIS — R748 Abnormal levels of other serum enzymes: Secondary | ICD-10-CM

## 2022-04-08 DIAGNOSIS — G43839 Menstrual migraine, intractable, without status migrainosus: Secondary | ICD-10-CM

## 2022-04-08 DIAGNOSIS — E559 Vitamin D deficiency, unspecified: Secondary | ICD-10-CM

## 2022-04-08 DIAGNOSIS — F5101 Primary insomnia: Secondary | ICD-10-CM

## 2022-04-08 DIAGNOSIS — Z0283 Encounter for blood-alcohol and blood-drug test: Secondary | ICD-10-CM

## 2022-04-08 DIAGNOSIS — J3089 Other allergic rhinitis: Secondary | ICD-10-CM

## 2022-04-08 MED ORDER — EPINEPHRINE 0.3 MG/0.3ML IJ SOAJ: 0.3000 mg | INTRAMUSCULAR | 0 refills | Status: DC | PRN

## 2022-04-08 MED ORDER — LEVOTHYROXINE SODIUM 150 MCG PO TABS: 150.0000 ug | ORAL_TABLET | Freq: Every day | ORAL | 1 refills | Status: DC

## 2022-04-08 MED ORDER — NORTRIPTYLINE HCL 10 MG PO CAPS: ORAL_CAPSULE | ORAL | 1 refills | Status: DC

## 2022-04-08 MED ORDER — ZOLPIDEM TARTRATE 5 MG PO TABS: 5.0000 mg | ORAL_TABLET | Freq: Every day | ORAL | 0 refills | Status: DC

## 2022-04-08 NOTE — Assessment & Plan Note (Signed)
Refill sent for synthroid (brand name) 150 mcg  ?Pt to restart ?tsh and free t4 ordered today pending results ?

## 2022-04-08 NOTE — Progress Notes (Signed)
? ?Established Patient Office Visit ? ?Subjective:  ?Patient ID: Jacqueline Wilson, female    DOB: 06-05-1981  Age: 41 y.o. MRN: [id number] ? ?CC:  ?Chief Complaint  ?Patient presents with  ? Transitions Of Care  ? ? ?HPI ?Jacqueline Wilson is here for a transition of care visit. ? ?Prior provider was: Laverna Peace FNP  ?Pt is without acute concerns.  ? ?chronic concerns: ? ?Intracranial hypertension: nortriptyline manages the migraines and while she was on the medication she had not had a migraine in years.  Managed by her weight, as long as she keeps it managed then she finds the migraines are much more controlled. No recent migraines in the last six months, last time was when she had ran out medication. She has been out for a week now, no episode yet.  ?She was diagnosed nine years ago, and saw neurology however since controlled.  ? ?Elevated ferritin: recently saw hemoc , timothy Finnegan. Resoled, possibly secondary to acute phase reactant. Hemochromatosis was negative.  ? ?Hypothyroid, surgically resected due to goiter: has been fluctuating. Thyroid resection in 2002 due to airway impingement. Was seeing Dr. Renato Shin who has since resigned needs a new endo referral.  ?On levothyroxine 150 mcg however should probably be on brand name. She has been out of levothyroxine 150 for once week, was taking 75 mcg that was leftover from the past however.  ? ?Sees gynecologist, [facility]. No chance of pregnancy, states IUD/mirena which was placed 04/18/17. Gyn is aware she is on ocps as her neurologist states this helps with her migraines for management. ?She also takes lo loestrin and states that Gyn agreed to this as she gets hormonal headaches as well as ICH. She is aware of the risks involved and states this has been discussed with her GYN. She is due for repeat pap in 08/2022.mirena good for seven years.  ? ?Sleep disturbance: ambien 5 mg, severe insomnia. Even as a little girl wouldn't sleep for days, requires  ambien to have deep sleep, otherwise she micro-sleeps. She will use melatonin if needed. She typically finds she takes [place] 2-3 times weekly. On average without ambien sleeps between 2-4 hours, ten days a month maybe sleeps 8 hours on those nights. No sleep walking. Without Lorrin Mais she has started talking very loudly in her sleep, has been taking [place] for about nine years. ?  ?Lab Results  ?Component Value Date  ? TSH 0.26 (L) 04/08/2022  ? ?Screening for breast cancer: pt prefers to wait for her GYN to prescribe. She had genetic testing and was negative per pt.she prefers to wait until she sees GYN for the order.  ? ? ?Past Medical History:  ?Diagnosis Date  ? Allergy   ? BRCA negative 08/2018  ? MyRisk neg except PALB2 VUS  ? Colon polyp 02/2016  ? colonoscopy; repeat due in 3 yrs  ? Elevated ferritin   ? Family history of breast cancer 08/2018  ? IBIS=18.7%  ? History of goiter   ? thryoidectomy  ? Hyperlipidemia   ? Hypothyroidism   ? Internal hemorrhoids   ? Intracranial hypertension 12/31/2011  ? Migraine   ? without aura and menstrual migraine with aura; sees neuro  ? Pre-eclampsia   ? Thyroid disease   ? Thryoidectomy  ? ? ?Past Surgical History:  ?Procedure Laterality Date  ? CESAREAN SECTION    ? COLONOSCOPY  03/27/2016  ? polyp, internal hemorrhoid; repeat in 3 yrs.  ? HEMORRHOID BANDING    ?  POLYPECTOMY    ? THYROIDECTOMY  2010  ? treat benign goiter  ? ? ?Family History  ?Problem Relation Age of Onset  ? Obesity Mother   ? Heart disease Father   ? COPD Father   ?     smoker  ? Lung cancer Father   ? Thyroid disease Maternal Grandmother   ? Breast cancer Maternal Grandmother 40  ? Lung cancer Paternal Uncle   ? Colon cancer Paternal Uncle   ? Esophageal cancer Neg Hx   ? Migraines Neg Hx   ? Ovarian cancer Neg Hx   ? Colon polyps Neg Hx   ? ? ?Social History  ? ?Socioeconomic History  ? Marital status: Married  ?  Spouse name: Christopher Banning  ? Number of children: 1  ? Years of education: 18  ?  Highest education level: Master's degree (e.g., MA, MS, MEng, MEd, MSW, MBA)  ?Occupational History  ? Occupation: Marketing   ? Occupation: Therapist [employer]  ?  Comment: OCD and related disorders  ?Tobacco Use  ? Smoking status: Never  ?  Passive exposure: Never  ? Smokeless tobacco: Never  ?Vaping Use  ? Vaping Use: Never used  ?Substance and Sexual Activity  ? Alcohol use: Not Currently  ?  Alcohol/week: 0.0 standard drinks  ? Drug use: Never  ? Sexual activity: Yes  ?  Partners: Male  ?  Birth control/protection: I.U.D.  ?Other Topics Concern  ? Not on file  ?Social History Narrative  ? [name]  ? 41 years old-2023  ? ?Social Determinants of Health  ? ?Financial Resource Strain: Not on file  ?Food Insecurity: Not on file  ?Transportation Needs: Not on file  ?Physical Activity: Not on file  ?Stress: Not on file  ?Social Connections: Not on file  ?Intimate Partner Violence: Not on file  ? ? ?Outpatient Medications Prior to Visit  ?Medication Sig Dispense Refill  ? albuterol (VENTOLIN HFA) 108 (90 Base) MCG/ACT inhaler TAKE 2 PUFFS BY MOUTH EVERY 4-6 HOURS AS NEEDED FOR WHEEZE OR SHORTNESS OF BREATH 18 g 1  ? cetirizine (ZYRTEC) 10 MG tablet Take 10 mg by mouth daily.    ? fluticasone (FLOVENT HFA) 110 MCG/ACT inhaler Inhale 2 puffs twice a day with spacer to help prevent cough and wheeze. 1 each 3  ? ibuprofen (ADVIL) 200 MG tablet Take 400 mg by mouth every 6 (six) hours as needed.    ? Inulin (FIBER CHOICE PO) Take 5 capsules by mouth at bedtime.    ? Melatonin 2.5 MG CAPS Take 1 capsule by mouth at bedtime.    ? Multiple Vitamin (MULTIVITAMIN) tablet Take 1 tablet by mouth daily.    ? Norethindrone-Ethinyl Estradiol-Fe Biphas (LO LOESTRIN FE) 1 MG-10 MCG / 10 MCG tablet TAKE 1 TABLET BY MOUTH DAILY. CONTINUOUS DOSING 84 tablet 1  ? Vitamin D, Cholecalciferol, 25 MCG (1000 UT) CAPS Take 1 capsule by mouth daily.    ? EPINEPHrine 0.3 mg/0.3 mL IJ SOAJ injection Inject 0.3 mg into the  muscle as needed for anaphylaxis. 1 each 0  ? levothyroxine (SYNTHROID) 150 MCG tablet TAKE 1 TABLET(150 MCG) BY MOUTH DAILY 30 tablet 3  ? nortriptyline (PAMELOR) 10 MG capsule TAKE 1 CAPSULE(10 MG) BY MOUTH AT BEDTIME 90 capsule 0  ? zolpidem (AMBIEN) 5 MG tablet Take 0.5-1 tablets (2.5-5 mg total) by mouth at bedtime. 90 tablet 0  ? meloxicam (MOBIC) 15 MG tablet Take 1 tablet (15 mg total) by   mouth daily. (Patient not taking: Reported on 04/08/2022) 30 tablet 0  ? ?No facility-administered medications prior to visit.  ? ? ?Allergies  ?Allergen Reactions  ? Flonase [Fluticasone Propionate] Anaphylaxis  ?  Has alcohol in nasal spray  ? Hydrocodone-Acetaminophen Other (See Comments)  ?  Suicidal   ? Latex Anaphylaxis  ? Rubbing Alcohol [Alcohol] Anaphylaxis  ? Topamax [Topiramate] Other (See Comments)  ?  HI/SI  ? Bean Pod Extract Swelling  ?  Butter Bean, Baked Bean: Facial Swelling  ? Hydrocodone   ?  Suicidal thoughts with medication  ? Mucinex [Guaifenesin Er] Other (See Comments)  ?  Lose hearing for 1-2 weeks.  ? Penicillins   ? Cephalexin Rash  ? ? ?ROS ?Review of Systems  ?Constitutional:  Negative for chills, fatigue, fever and unexpected weight change.  ?Eyes:  Negative for visual disturbance.  ?Respiratory:  Negative for shortness of breath.   ?Cardiovascular:  Negative for chest pain.  ?Gastrointestinal:  Negative for abdominal pain.  ?Genitourinary:  Negative for difficulty urinating.  ?Skin:  Negative for rash.  ?Neurological:  Negative for dizziness and headaches.  ?Psychiatric/Behavioral:  Positive for sleep disturbance. Negative for agitation, behavioral problems, decreased concentration, self-injury and suicidal ideas. The patient is not nervous/anxious.   ? ? ? ?  ?Objective:  ?  ?Physical Exam ? ?Gen: NAD, resting comfortably ?CV: RRR with no murmurs appreciated ?Pulm: NWOB, CTAB with no crackles, wheezes, or rhonchi ?Skin: warm, dry ?Psych: Normal affect and thought content ? ?BP 118/76   Pulse  89   Temp 98.7 ?F (37.1 ?C)   Resp 16   Ht 5' 3" (1.6 m)   Wt 185 lb 4 oz (84 kg)   SpO2 98%   BMI 32.82 kg/m?  ?Wt Readings from Last 3 Encounters:  ?04/08/22 185 lb 4 oz (84 kg)  ?01/16/22 183 lb (83 kg)

## 2022-04-08 NOTE — Assessment & Plan Note (Signed)
Refilled epi pen as over one year old. ?

## 2022-04-08 NOTE — Assessment & Plan Note (Signed)
pdmp reviewed ?rx ambien, take 1/2 to 1 po qhs prn insomnia ?Drug control contract signed ?UDS ordered for today ? ?

## 2022-04-08 NOTE — Assessment & Plan Note (Signed)
Pt taking OCPs for this currently, seems to manage.  ?Advised pt to f/u with GYN in future for this refill once pap in September as she is also using IUD and I prefer she prescribe knowing she is on both. Did review note and GYN is aware.  ?Reviewed last pap, WNL. ?

## 2022-04-08 NOTE — Assessment & Plan Note (Signed)
Stable, controlled. ?Restart, Rx sent for nortriptyline 10 mg  ?

## 2022-04-08 NOTE — Assessment & Plan Note (Signed)
Ordered lipid panel as pt is fasting.  ?Work on [employer] and exercise as tolerated ?

## 2022-04-08 NOTE — Assessment & Plan Note (Signed)
Stable for nine years per pt, if sx begin to reoccur consider neurology referral ?Maintain weight, exercise as tolerated ?Refill nortriptyline ?

## 2022-04-08 NOTE — Assessment & Plan Note (Signed)
Continue zyrtec 10 mg as needed ?

## 2022-04-08 NOTE — Patient Instructions (Signed)
Schedule pap smear for 08/2022 ? ?It was a pleasure seeing you today! Please do not hesitate to reach out with any questions and or concerns. ? ?Regards,  ? ?Debria Broecker ?FNP-C ? ?

## 2022-04-08 NOTE — Assessment & Plan Note (Signed)
Will repeat cmp today pending results. 

## 2022-04-08 NOTE — Assessment & Plan Note (Signed)
Recent cbc and ferritin reviewed, stable ?

## 2022-04-08 NOTE — Assessment & Plan Note (Signed)
UDS ordered for today ?Also had pt sign controlled substance report today for refill of ambien ?pdmp reviewed ?

## 2022-04-09 ENCOUNTER — Encounter: Payer: Self-pay | Admitting: Family

## 2022-04-10 ENCOUNTER — Telehealth: Payer: Self-pay

## 2022-04-10 LAB — LIPID PANEL
Cholesterol: 243 mg/dL — ABNORMAL HIGH (ref <200)
HDL: 47 mg/dL — ABNORMAL LOW (ref >=50)
LDL Cholesterol (Calc): 147 mg/dL (calc) — ABNORMAL HIGH
Non-HDL Cholesterol (Calc): 196 mg/dL (calc) — ABNORMAL HIGH (ref <130)
Total CHOL/HDL Ratio: 5.2 (calc) — ABNORMAL HIGH (ref <5.0)
Triglycerides: 317 mg/dL — ABNORMAL HIGH (ref <150)

## 2022-04-10 LAB — DRUG MONITORING PANEL 376104, URINE
Amphetamines: NEGATIVE ng/mL (ref <500)
Barbiturates: NEGATIVE ng/mL (ref <300)
Benzodiazepines: NEGATIVE ng/mL (ref <100)
Cocaine Metabolite: NEGATIVE ng/mL (ref <150)
Desmethyltramadol: NEGATIVE ng/mL (ref <100)
Opiates: NEGATIVE ng/mL (ref <100)
Oxycodone: NEGATIVE ng/mL (ref <100)
Tramadol: NEGATIVE ng/mL (ref <100)

## 2022-04-10 LAB — TSH: TSH: 0.26 mIU/L — ABNORMAL LOW

## 2022-04-10 LAB — DM TEMPLATE

## 2022-04-10 LAB — T4, FREE: Free T4: 1.7 ng/dL (ref 0.8–1.8)

## 2022-04-10 NOTE — Telephone Encounter (Signed)
-----   Message from Eugenia Pancoast, [place] sent at 04/10/2022 12:06 PM EDT ----- ?For being out of medication TSH is closer to the hyperthyroid side.  ?I suggest you take 150 mcg once a day for six days, don't take anything on the seventh day.  ?Repeat TSH in six weeks.  ?? ?Remind me again, were you fasting with your labs? ?If she was fasting we need to really work on low cholesterol diet. Omega 3 fish oils over the counter would be beneficial to.  ? ?Urine drug screening negative.  ?

## 2022-04-10 NOTE — Assessment & Plan Note (Signed)
Continue vitamin d supplement 

## 2022-04-15 ENCOUNTER — Encounter: Payer: Self-pay | Admitting: Family

## 2022-05-03 DIAGNOSIS — Z0289 Encounter for other administrative examinations: Secondary | ICD-10-CM

## 2022-05-15 ENCOUNTER — Other Ambulatory Visit: Payer: Self-pay

## 2022-05-15 DIAGNOSIS — Z3009 Encounter for other general counseling and advice on contraception: Secondary | ICD-10-CM

## 2022-05-15 DIAGNOSIS — G43839 Menstrual migraine, intractable, without status migrainosus: Secondary | ICD-10-CM

## 2022-05-15 MED ORDER — LO LOESTRIN FE 1 MG-10 MCG / 10 MCG PO TABS: ORAL_TABLET | ORAL | 1 refills | Status: DC

## 2022-06-05 ENCOUNTER — Encounter (INDEPENDENT_AMBULATORY_CARE_PROVIDER_SITE_OTHER): Payer: Self-pay | Admitting: Bariatrics

## 2022-06-05 ENCOUNTER — Ambulatory Visit (INDEPENDENT_AMBULATORY_CARE_PROVIDER_SITE_OTHER): Payer: No Typology Code available for payment source | Admitting: Bariatrics

## 2022-06-05 VITALS — BP 109/77 | HR 87 | Temp 98.0°F | Ht 63.0 in | Wt 178.0 lb

## 2022-06-05 DIAGNOSIS — Z1331 Encounter for screening for depression: Secondary | ICD-10-CM

## 2022-06-05 DIAGNOSIS — E668 Other obesity: Secondary | ICD-10-CM

## 2022-06-05 DIAGNOSIS — R5383 Other fatigue: Secondary | ICD-10-CM

## 2022-06-05 DIAGNOSIS — E038 Other specified hypothyroidism: Secondary | ICD-10-CM | POA: Diagnosis not present

## 2022-06-05 DIAGNOSIS — K5909 Other constipation: Secondary | ICD-10-CM

## 2022-06-05 DIAGNOSIS — R0602 Shortness of breath: Secondary | ICD-10-CM

## 2022-06-05 DIAGNOSIS — Z6831 Body mass index (BMI) 31.0-31.9, adult: Secondary | ICD-10-CM

## 2022-06-05 DIAGNOSIS — R7309 Other abnormal glucose: Secondary | ICD-10-CM

## 2022-06-05 DIAGNOSIS — F5089 Other specified eating disorder: Secondary | ICD-10-CM

## 2022-06-05 DIAGNOSIS — E7849 Other hyperlipidemia: Secondary | ICD-10-CM | POA: Diagnosis not present

## 2022-06-05 DIAGNOSIS — E559 Vitamin D deficiency, unspecified: Secondary | ICD-10-CM

## 2022-06-05 DIAGNOSIS — E669 Obesity, unspecified: Secondary | ICD-10-CM

## 2022-06-06 NOTE — Progress Notes (Unsigned)
Chief Complaint:   OBESITY Jacqueline Wilson (MR# [id number]) is a 41 y.o. female who presents for evaluation and treatment of obesity and related comorbidities. Current BMI is Body mass index is 31.53 kg/m. Jacqueline Wilson has been struggling with her weight for many years and has been unsuccessful in either losing weight, maintaining weight loss, or reaching her healthy weight goal.  Jacqueline Wilson does not enjoy cooking and states that she hates it. She states that she stress eats.   Jacqueline Wilson is currently in the action stage of change and ready to dedicate time achieving and maintaining a healthier weight. Jacqueline Wilson is interested in becoming our patient and working on intensive lifestyle modifications including (but not limited to) diet and exercise for weight loss.  Jacqueline Wilson's habits were reviewed today and are as follows: Her family eats meals together, she thinks her family will eat healthier with her, she struggles with family and or coworkers weight loss sabotage, her desired weight loss is 43 pounds, she started gaining weight January, 2022, her heaviest weight ever was 183 pounds, she has significant food cravings issues, she snacks frequently in the evenings, she wakes up frequently in the middle of the night to eat, she skips meals frequently, she frequently makes poor food choices, she frequently eats larger portions than normal, she has binge eating behaviors, and she struggles with emotional eating.  Depression Screen Jacqueline Wilson's Food and Mood (modified PHQ-9) score was 15.     06/05/2022    7:51 AM  Depression screen PHQ 2/9  Decreased Interest 3  Down, Depressed, Hopeless 3  PHQ - 2 Score 6  Altered sleeping 3  Tired, decreased energy 3  Change in appetite 3  Feeling bad or failure about yourself  0  Trouble concentrating 0  Moving slowly or fidgety/restless 0  Suicidal thoughts 0  PHQ-9 Score 15  Difficult doing work/chores Not difficult at all   Subjective:   1. Other fatigue Lace admits to daytime  somnolence and admits to waking up still tired. Patient has a history of symptoms of daytime fatigue, morning fatigue, and morning headache. Jacqueline Wilson generally gets 5 or 6 hours of sleep per night, and states that she has difficulty falling asleep and generally restful sleep. Snoring is present. Apneic episodes are not present. Epworth Sleepiness Score is 8. [name]'s RMR is lower than expected. Her expected RMR 1592. Her actual RMR is 1454.   2. SOB (shortness of breath) on exertion Jacqueline Wilson notes increasing shortness of breath with exercising and seems to be worsening over time with weight gain. She notes getting out of breath sooner with activity than she used to. This has not gotten worse recently. Jacqueline Wilson denies shortness of breath at rest or orthopnea.   3. Other specified hypothyroidism Jacqueline Wilson is seeing an endocrinologist. We discussed TSH 0.26.  4. Other hyperlipidemia Jacqueline Wilson is not on medications. Her ASCVD was 0.9%. Her last lipids was 04/08/2022.  5. Vitamin D deficiency Jacqueline Wilson is taking multivitamin and Vitamin D currently.   6. Other constipation Jacqueline Wilson is taking fiber and added Miralax.   7. Elevated glucose We discussed elevated glucose.   8. Other disorder of eating Aliece notes some food insecurity.   Assessment/Plan:   1. Other fatigue Jacqueline Wilson does feel that her weight is causing her energy to be lower than it should be. Fatigue may be related to obesity, depression or many other causes. Labs will be ordered, and in the meanwhile, Jacqueline Wilson will focus on self care including making healthy food choices,  increasing physical activity and focusing on stress reduction. We discussed indirect  calorimetry in detail.  We reviewed EKG today. We will check TSH today.   - EKG 12-Lead - TSH+T4F+T3Free  2. SOB (shortness of breath) on exertion Jacqueline Wilson does feel that she gets out of breath more easily that she used to when she exercises. Jacqueline Wilson's shortness of breath appears to be obesity related and exercise induced.  She has agreed to work on weight loss and gradually increase exercise to treat her exercise induced shortness of breath. Will continue to monitor closely.   - TSH+T4F+T3Free  3. Other specified hypothyroidism Jacqueline Wilson was referral for endocrinologist. She will continue system. We will check thyroid panel today. Orders and follow up as documented in patient record.  Counseling Good thyroid control is important for overall health. Supratherapeutic thyroid levels are dangerous and will not improve weight loss results. Counseling: The correct way to take levothyroxine is fasting, with water, separated by at least 30 minutes from breakfast, and separated by more than 4 hours from calcium, iron, multivitamins, acid reflux medications (PPIs).    - [id number]+[id number] - Comprehensive metabolic panel  4. Other hyperlipidemia Cardiovascular risk and specific lipid/LDL goals reviewed.  Jacqueline Wilson will have no trans fats. She will minimize saturated fats. We discussed several lifestyle modifications today and Jacqueline Wilson will continue to work on diet, exercise and weight loss efforts. Orders and follow up as documented in patient record.   Counseling Intensive lifestyle modifications are the first line treatment for this issue. Dietary changes: Increase soluble fiber. Decrease simple carbohydrates. Exercise changes: Moderate to vigorous-intensity aerobic activity 150 minutes per week if tolerated. Lipid-lowering medications: see documented in medical record.  5. Vitamin D deficiency Low Vitamin D level contributes to fatigue and are associated with obesity, breast, and colon cancer. We will check Vitamin D and [name] will follow-up for routine testing of Vitamin D, at least 2-3 times per year to avoid over-replacement.  - VITAMIN D 25 Hydroxy (Vit-D Deficiency, Fractures)  6. Other constipation Jacqueline Wilson was informed that a decrease in bowel movement frequency is normal while losing weight, but stools should not be hard or  painful. Jacqueline Wilson will keep her water intake high. She will continue Miralax. Orders and follow up as documented in patient record.   Counseling Getting to Good Bowel Health: Your goal is to have one soft bowel movement each day. Drink at least 8 glasses of water each day. Eat plenty of fiber (goal is over 25 grams each day). It is best to get most of your fiber from dietary sources which includes leafy green vegetables, fresh fruit, and whole grains. You may need to add fiber with the help of OTC fiber supplements. These include Metamucil, Citrucel, and Flaxseed. If you are still having trouble, try adding Miralax or Magnesium Citrate. If all of these changes do not work, Cabin crew.   7. Elevated glucose We will check A1C and insulin today.   - Comprehensive metabolic panel - Hemoglobin A1c - Insulin, random  8. Other disorder of eating Aeriana was referred to Dr. Mallie Mussel our Bariatric Psychologist. Behavior modification techniques were discussed today to help Bryony deal with her emotional/non-hunger eating behaviors.  Orders and follow up as documented in patient record.   9. Depression screening Tema had a positive depression screening. Depression is commonly associated with obesity and often results in emotional eating behaviors. We will monitor this closely and work on CBT to help improve the non-hunger eating patterns. Referral to Psychology may  be required if no improvement is seen as she continues in our clinic.   10. Class 1 obesity with serious comorbidity and body mass index (BMI) of 31.0 to 31.9 in adult, unspecified obesity type Lorissa is currently in the action stage of change and her goal is to continue with weight loss efforts. I recommend Esme begin the structured treatment plan as follows:  She has agreed to the Category 1 Plan.  Ariadne will continue meal planning. We reviewed labs from 04/08/2022 TSH, T4, Lipid panel, and 02/04/2022 iron and anemia panel.  Exercise goals:   Preslynn is doing athletic weight lifting.     Behavioral modification strategies: increasing lean protein intake, decreasing simple carbohydrates, increasing vegetables, increasing water intake, decreasing eating out, no skipping meals, meal planning and cooking strategies, keeping healthy foods in the home, and planning for success.  She was informed of the importance of frequent follow-up visits to maximize her success with intensive lifestyle modifications for her multiple health conditions. She was informed we would discuss her lab results at her next visit unless there is a critical issue that needs to be addressed sooner. Tesha agreed to keep her next visit at the agreed upon time to discuss these results.  Objective:   Blood pressure 109/77, pulse 87, temperature 98 F (36.7 C), height '5\' 3"'$  (1.6 m), weight 178 lb (80.7 kg), SpO2 98 %. Body mass index is 31.53 kg/m.  EKG: Normal sinus rhythm, rate 78 bpm.  Indirect Calorimeter completed today shows a VO2 of 211 and a REE of 1454.  Her calculated basal metabolic rate is 0814 thus her basal metabolic rate is worse than expected.  General: Cooperative, alert, well developed, in no acute distress. HEENT: Conjunctivae and lids unremarkable. Cardiovascular: Regular rhythm.  Lungs: Normal work of breathing. Neurologic: No focal deficits.   Lab Results  Component Value Date   CREATININE 0.84 12/20/2021   BUN 14 12/20/2021   NA 139 12/20/2021   K 4.4 12/20/2021   CL 106 12/20/2021   CO2 24 12/20/2021   Lab Results  Component Value Date   ALT 16 12/20/2021   AST 15 12/20/2021   BILITOT 0.5 12/20/2021   No results found for: "HGBA1C" No results found for: "INSULIN" Lab Results  Component Value Date   TSH 0.26 (L) 04/08/2022   Lab Results  Component Value Date   CHOL 243 (H) 04/08/2022   HDL 47 (L) 04/08/2022   LDLCALC 147 (H) 04/08/2022   TRIG 317 (H) 04/08/2022   CHOLHDL 5.2 (H) 04/08/2022   Lab Results  Component Value  Date   WBC 6.7 02/04/2022   HGB 13.6 02/04/2022   HCT 39.7 02/04/2022   MCV 89.8 02/04/2022   PLT 255 02/04/2022   Lab Results  Component Value Date   IRON 96 02/04/2022   TIBC 377 02/04/2022   FERRITIN 292 02/04/2022   Attestation Statements:   Reviewed by clinician on day of visit: allergies, medications, problem list, medical history, surgical history, family history, social history, and previous encounter notes.  I, Lizbeth Bark, RMA, am acting as Location manager for [employer], DO.  I have reviewed the above documentation for accuracy and completeness, and I agree with the above. Jearld Lesch, DO

## 2022-06-07 LAB — COMPREHENSIVE METABOLIC PANEL
ALT: 18 IU/L (ref 0–32)
AST: 18 IU/L (ref 0–40)
Albumin/Globulin Ratio: 1.8 (ref 1.2–2.2)
Albumin: 4.4 g/dL (ref 3.8–4.8)
Alkaline Phosphatase: 76 IU/L (ref 44–121)
BUN/Creatinine Ratio: 14 (ref 9–23)
BUN: 13 mg/dL (ref 6–24)
Bilirubin Total: 0.3 mg/dL (ref 0.0–1.2)
CO2: 21 mmol/L (ref 20–29)
Calcium: 9.3 mg/dL (ref 8.7–10.2)
Chloride: 101 mmol/L (ref 96–106)
Creatinine, Ser: 0.93 mg/dL (ref 0.57–1.00)
Globulin, Total: 2.5 g/dL (ref 1.5–4.5)
Glucose: 88 mg/dL (ref 70–99)
Potassium: 4.8 mmol/L (ref 3.5–5.2)
Sodium: 140 mmol/L (ref 134–144)
Total Protein: 6.9 g/dL (ref 6.0–8.5)
eGFR: 80 mL/min/{1.73_m2} (ref >59)

## 2022-06-07 LAB — TSH+T4F+T3FREE
Free T4: 2.15 ng/dL — ABNORMAL HIGH (ref 0.82–1.77)
T3, Free: 3.5 pg/mL (ref 2.0–4.4)
TSH: 0.051 u[IU]/mL — ABNORMAL LOW (ref 0.450–4.500)

## 2022-06-07 LAB — HEMOGLOBIN A1C
Est. average glucose Bld gHb Est-mCnc: 94 mg/dL
Hgb A1c MFr Bld: 4.9 % (ref 4.8–5.6)

## 2022-06-07 LAB — INSULIN, RANDOM: INSULIN: 8.6 u[IU]/mL (ref 2.6–24.9)

## 2022-06-07 LAB — VITAMIN D 25 HYDROXY (VIT D DEFICIENCY, FRACTURES): Vit D, 25-Hydroxy: 35.4 ng/mL (ref 30.0–100.0)
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
## 2022-06-10 ENCOUNTER — Encounter (INDEPENDENT_AMBULATORY_CARE_PROVIDER_SITE_OTHER): Payer: Self-pay

## 2022-06-10 ENCOUNTER — Telehealth (INDEPENDENT_AMBULATORY_CARE_PROVIDER_SITE_OTHER): Payer: Self-pay

## 2022-06-10 ENCOUNTER — Encounter (INDEPENDENT_AMBULATORY_CARE_PROVIDER_SITE_OTHER): Payer: Self-pay | Admitting: Bariatrics

## 2022-06-10 NOTE — Telephone Encounter (Signed)
-----   Message from Georgia Lopes, DO sent at 06/10/2022  1:43 PM EDT ----- Call pt. Her TSH is low. Has she ever been told that she had thyroid issues ?

## 2022-06-10 NOTE — Telephone Encounter (Signed)
Left message for patient to return call.

## 2022-06-11 ENCOUNTER — Encounter (INDEPENDENT_AMBULATORY_CARE_PROVIDER_SITE_OTHER): Payer: Self-pay | Admitting: Bariatrics

## 2022-06-11 NOTE — Addendum Note (Signed)
Addended byEzzard Flax T on: 06/11/2022 11:55 AM   Modules accepted: Orders

## 2022-06-11 NOTE — Telephone Encounter (Signed)
Done

## 2022-06-18 ENCOUNTER — Telehealth (INDEPENDENT_AMBULATORY_CARE_PROVIDER_SITE_OTHER): Payer: Self-pay | Admitting: Psychology

## 2022-06-18 ENCOUNTER — Telehealth (INDEPENDENT_AMBULATORY_CARE_PROVIDER_SITE_OTHER): Payer: No Typology Code available for payment source | Admitting: Psychology

## 2022-06-18 ENCOUNTER — Encounter (INDEPENDENT_AMBULATORY_CARE_PROVIDER_SITE_OTHER): Payer: Self-pay

## 2022-06-18 DIAGNOSIS — F5089 Other specified eating disorder: Secondary | ICD-10-CM | POA: Diagnosis not present

## 2022-06-18 DIAGNOSIS — F419 Anxiety disorder, unspecified: Secondary | ICD-10-CM | POA: Diagnosis not present
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
## 2022-06-19 ENCOUNTER — Encounter (INDEPENDENT_AMBULATORY_CARE_PROVIDER_SITE_OTHER): Payer: Self-pay | Admitting: Bariatrics

## 2022-06-19 ENCOUNTER — Ambulatory Visit (INDEPENDENT_AMBULATORY_CARE_PROVIDER_SITE_OTHER): Payer: No Typology Code available for payment source | Admitting: Bariatrics

## 2022-06-19 VITALS — BP 115/75 | HR 89 | Temp 97.9°F | Ht 63.0 in | Wt 173.0 lb

## 2022-06-19 DIAGNOSIS — E559 Vitamin D deficiency, unspecified: Secondary | ICD-10-CM

## 2022-06-19 DIAGNOSIS — E669 Obesity, unspecified: Secondary | ICD-10-CM

## 2022-06-19 DIAGNOSIS — Z6830 Body mass index (BMI) 30.0-30.9, adult: Secondary | ICD-10-CM | POA: Diagnosis not present

## 2022-06-19 DIAGNOSIS — E038 Other specified hypothyroidism: Secondary | ICD-10-CM | POA: Diagnosis not present

## 2022-06-19 MED ORDER — VITAMIN D (ERGOCALCIFEROL) 1.25 MG (50000 UNIT) PO CAPS: 50000.0000 [IU] | ORAL_CAPSULE | ORAL | 0 refills | Status: DC

## 2022-06-24 NOTE — Progress Notes (Signed)
Chief Complaint:   OBESITY Jacqueline Wilson is here to discuss her progress with her obesity treatment plan along with follow-up of her obesity related diagnoses. Jacqueline Wilson is on the Category 1 Plan and states she is following her eating plan approximately 100% of the time. Jacqueline Wilson states she is walking, and lifting weights ofr 30-60 minutes 1-2 times per week.  Today's visit was #: 2 Starting weight: 178 lbs Starting date: 06/05/2022 Today's weight: 173 lbs Today's date: 06/19/2022 Total lbs lost to date: 5 Total lbs lost since last in-office visit: 5  Interim History: Jacqueline Wilson is down another 5 pounds since her first visit.  She states that it was tough.  Her sugar cravings are down.  Subjective:   1. Vitamin D deficiency Jacqueline Wilson is taking vitamin D OTC.  2. Other specified hypothyroidism Jacqueline Wilson's TSH is low.  Assessment/Plan:   1. Vitamin D deficiency Jacqueline Wilson agreed to start prescription vitamin D 50,000 units once weekly with no refills.  - Vitamin D, Ergocalciferol, (DRISDOL) 1.25 MG (50000 UNIT) CAPS capsule; Take 1 capsule (50,000 Units total) by mouth every 7 (seven) days.  Dispense: 5 capsule; Refill: 0  2. Other specified hypothyroidism Jacqueline Wilson is monitoring her medications, not taking medications (Synthroid) on Sunday.  We will recheck her labs in 4 to 6 weeks.  3. Obesity, Current BMI 30.7 Jacqueline Wilson is currently in the action stage of change. As such, her goal is to continue with weight loss efforts. She has agreed to the Category 1 Plan and keeping a food journal and adhering to recommended goals of 1000 calories and 70 grams of protein.   Meal planning and intentional eating were discussed.  Vacation tips.  Dining out guy was given.  Reviewed labs from 06/05/2022 CMP and vitamin D.  Exercise goals: As is.  Behavioral modification strategies: increasing lean protein intake, decreasing simple carbohydrates, increasing vegetables, increasing water intake, decreasing eating out, no skipping meals, meal  planning and cooking strategies, keeping healthy foods in the home, and planning for success.  Jacqueline Wilson has agreed to follow-up with our clinic in 2 weeks. She was informed of the importance of frequent follow-up visits to maximize her success with intensive lifestyle modifications for her multiple health conditions.   Objective:   Blood pressure 115/75, pulse 89, temperature 97.9 F (36.6 C), height '5\' 3"'$  (1.6 m), weight 173 lb (78.5 kg), SpO2 100 %. Body mass index is 30.65 kg/m.  General: Cooperative, alert, well developed, in no acute distress. HEENT: Conjunctivae and lids unremarkable. Cardiovascular: Regular rhythm.  Lungs: Normal work of breathing. Neurologic: No focal deficits.   Lab Results  Component Value Date   CREATININE 0.93 06/05/2022   BUN 13 06/05/2022   NA 140 06/05/2022   K 4.8 06/05/2022   CL 101 06/05/2022   CO2 21 06/05/2022   Lab Results  Component Value Date   ALT 18 06/05/2022   AST 18 06/05/2022   ALKPHOS 76 06/05/2022   BILITOT 0.3 06/05/2022   Lab Results  Component Value Date   HGBA1C 4.9 06/05/2022   Lab Results  Component Value Date   INSULIN 8.6 06/05/2022   Lab Results  Component Value Date   TSH 0.051 (L) 06/05/2022   Lab Results  Component Value Date   CHOL 243 (H) 04/08/2022   HDL 47 (L) 04/08/2022   LDLCALC 147 (H) 04/08/2022   TRIG 317 (H) 04/08/2022   CHOLHDL 5.2 (H) 04/08/2022   Lab Results  Component Value Date   VD25OH 35.4  06/05/2022   VD25OH 43 09/17/2021   VD25OH 43 01/30/2021   Lab Results  Component Value Date   WBC 6.7 02/04/2022   HGB 13.6 02/04/2022   HCT 39.7 02/04/2022   MCV 89.8 02/04/2022   PLT 255 02/04/2022   Lab Results  Component Value Date   IRON 96 02/04/2022   TIBC 377 02/04/2022   FERRITIN 292 02/04/2022   Attestation Statements:   Reviewed by clinician on day of visit: allergies, medications, problem list, medical history, surgical history, family history, social history, and previous  encounter notes.   Wilhemena Durie, am acting as Location manager for [employer], DO.  I have reviewed the above documentation for accuracy and completeness, and I agree with the above. Jearld Lesch, DO

## 2022-06-26 ENCOUNTER — Encounter (INDEPENDENT_AMBULATORY_CARE_PROVIDER_SITE_OTHER): Payer: Self-pay | Admitting: Bariatrics

## 2022-07-01 NOTE — Progress Notes (Signed)
  Office: [phone]  /  Fax: [phone]    Date: 07/15/2022   Appointment Start Time: 2:32pm Duration: 32 minutes Provider: Glennie Isle, Psy.D. Type of Session: Individual Therapy  Location of Patient: Home (private location) Location of Provider: Provider's Home (private office) Type of Contact: Telepsychological Visit via MyChart Video Visit  Session Content: Jacqueline Wilson is a 41 y.o. female presenting for a follow-up appointment to address the previously established treatment goal of increasing coping skills.Today's appointment was a telepsychological visit. Jacqueline Wilson provided verbal consent for today's telepsychological appointment and she is aware she is responsible for securing confidentiality on her end of the session. Prior to proceeding with today's appointment, Jacqueline Wilson's physical location at the time of this appointment was obtained as well a phone number she could be reached at in the event of technical difficulties. Jacqueline Wilson and this provider participated in today's telepsychological service.   This provider conducted a brief check-in. Jacqueline Wilson shared about recent events, including a recent loss. She described currently feeling "frustrated" related to eating habits and weight loss. Briefly discussed possible consequences of weighing self at home. Psychoeducation provided regarding self-compassion. Jacqueline Wilson was engaged in a self-compassion exercise to help with eating-related challenges and other ongoing stressors. She was encouraged to regularly ask herself, "What do I need right now?" Previously shared referral options were re-sent via e-mail. Overall, Jacqueline Wilson was receptive to today's appointment as evidenced by openness to sharing, responsiveness to feedback, and willingness to implement discussed strategies .  Mental Status Examination:  Appearance: neat Behavior: appropriate to circumstances Mood: neutral Affect: mood congruent Speech: WNL Eye Contact: appropriate Psychomotor Activity: WNL Gait:  unable to assess Thought Process: linear, logical, and goal directed and no evidence or endorsement of suicidal, homicidal, and self-harm ideation, plan and intent  Thought Content/Perception: no hallucinations, delusions, bizarre thinking or behavior endorsed or observed Orientation: AAOx4 Memory/Concentration: memory, attention, language, and fund of knowledge intact  Insight: good Judgment: fair  Interventions:  Conducted a brief chart review Provided empathic reflections and validation Employed supportive psychotherapy interventions to facilitate reduced distress and to improve coping skills with identified stressors Psychoeducation provided regarding self-compassion Engaged pt in a self-compassion exercise  DSM-5 Diagnosis(es):  F50.89 Other Specified Feeding or Eating Disorder, Emotional Eating Behaviors and  F41.9 Unspecified Anxiety Disorder  Treatment Goal & Progress: During the initial appointment with this provider, the following treatment goal was established: increase coping skills. Progress is limited, as Jacqueline Wilson has just begun treatment with this provider; however, she is receptive to the interaction and interventions and rapport is being established.   Plan: Based on appointment availability and Jacqueline Wilson's schedule, the next appointment is scheduled for 08/13/2022 at 10am, which will be via [place] Visit. The next session will focus on working towards the established treatment goal.

## 2022-07-15 ENCOUNTER — Telehealth (INDEPENDENT_AMBULATORY_CARE_PROVIDER_SITE_OTHER): Payer: No Typology Code available for payment source | Admitting: Psychology

## 2022-07-15 DIAGNOSIS — F5089 Other specified eating disorder: Secondary | ICD-10-CM | POA: Diagnosis not present

## 2022-07-15 DIAGNOSIS — F419 Anxiety disorder, unspecified: Secondary | ICD-10-CM

## 2022-07-17 ENCOUNTER — Telehealth (INDEPENDENT_AMBULATORY_CARE_PROVIDER_SITE_OTHER): Payer: Self-pay | Admitting: Psychology

## 2022-07-17 ENCOUNTER — Encounter (INDEPENDENT_AMBULATORY_CARE_PROVIDER_SITE_OTHER): Payer: Self-pay | Admitting: Bariatrics

## 2022-07-17 ENCOUNTER — Ambulatory Visit (INDEPENDENT_AMBULATORY_CARE_PROVIDER_SITE_OTHER): Payer: No Typology Code available for payment source | Admitting: Bariatrics

## 2022-07-17 VITALS — BP 108/74 | HR 72 | Temp 97.8°F | Ht 63.0 in | Wt 174.0 lb

## 2022-07-17 DIAGNOSIS — Z6831 Body mass index (BMI) 31.0-31.9, adult: Secondary | ICD-10-CM

## 2022-07-17 DIAGNOSIS — K5909 Other constipation: Secondary | ICD-10-CM

## 2022-07-17 DIAGNOSIS — E559 Vitamin D deficiency, unspecified: Secondary | ICD-10-CM | POA: Diagnosis not present

## 2022-07-17 DIAGNOSIS — E038 Other specified hypothyroidism: Secondary | ICD-10-CM | POA: Diagnosis not present

## 2022-07-17 DIAGNOSIS — E669 Obesity, unspecified: Secondary | ICD-10-CM

## 2022-07-17 MED ORDER — VITAMIN D (ERGOCALCIFEROL) 1.25 MG (50000 UNIT) PO CAPS: 50000.0000 [IU] | ORAL_CAPSULE | ORAL | 0 refills | Status: DC

## 2022-07-17 NOTE — Telephone Encounter (Signed)
  Office: [phone]  /  Fax: [phone]   CONTENT: Jacqueline Wilson sent an email on July 15, 2022 at 8:24pm indicating the following: "I am unable to open the attachments for the referral.  It says the file is unsupported (I have [employer]) or is corrupted.  Please resend the referral information, preferably not as an attachment." As such, this provider re-sent the requested information via e-mail on July 17, 2022 at 12:32pm. PLAN: No further follow-up.

## 2022-07-23 NOTE — Progress Notes (Unsigned)
Chief Complaint:   OBESITY Jacqueline Wilson is here to discuss her progress with her obesity treatment plan along with follow-up of her obesity related diagnoses. Jacqueline Wilson is on the Category 1 Plan and keeping a food journal and adhering to recommended goals of 1000 calories and 70 grams of protein and states she is following her eating plan approximately 70% of the time. Jacqueline Wilson states she is walking 15-20 miles 1 time per week.    Today's visit was #: 3 Starting weight: 178 lbs Starting date: 06/05/2022 Today's weight: 174 lbs Today's date: 07/17/2022 Total lbs lost to date: 4 Total lbs lost since last in-office visit: 0  Interim History: Jacqueline Wilson is up 1 pound and she has been more stressed with the death in her family.  He is doing well with her protein intake.  Subjective:   1. Vitamin D deficiency Jacqueline Wilson notes increase in energy and she is sleeping better.  2. Other specified hypothyroidism Jacqueline Wilson is currently taking Synthroid.  3. Other constipation Jacqueline Wilson is currently taking MiraLAX.  Assessment/Plan:   1. Vitamin D deficiency Jacqueline Wilson will continue prescription vitamin D 50,000 units once weekly, we will refill for 1 month.  - Vitamin D, Ergocalciferol, (DRISDOL) 1.25 MG (50000 UNIT) CAPS capsule; Take 1 capsule (50,000 Units total) by mouth every 7 (seven) days.  Dispense: 5 capsule; Refill: 0  2. Other specified hypothyroidism Jacqueline Wilson will continue taking Synthroid as directed. Orders and follow up as documented in patient record.  3. Other constipation Jacqueline Wilson will continue MiraLAX and increase her water intake.  She may use MOM as needed.  4. Obesity, Current BMI 31.0 Jacqueline Wilson is currently in the action stage of change. As such, her goal is to continue with weight loss efforts. She has agreed to the Category 1 Plan.   Meal planning and intentional eating were discussed.  She is to work on reading her stress.  She will get back on track.  Exercise goals: As is.  Behavioral modification  strategies: increasing lean protein intake, decreasing simple carbohydrates, increasing vegetables, increasing water intake, decreasing eating out, no skipping meals, meal planning and cooking strategies, keeping healthy foods in the home, and planning for success.  Jacqueline Wilson has agreed to follow-up with our clinic in 2 to 3 weeks. She was informed of the importance of frequent follow-up visits to maximize her success with intensive lifestyle modifications for her multiple health conditions.   Objective:   Blood pressure 108/74, pulse 72, temperature 97.8 F (36.6 C), height '5\' 3"'$  (1.6 m), weight 174 lb (78.9 kg), SpO2 98 %. Body mass index is 30.82 kg/m.  General: Cooperative, alert, well developed, in no acute distress. HEENT: Conjunctivae and lids unremarkable. Cardiovascular: Regular rhythm.  Lungs: Normal work of breathing. Neurologic: No focal deficits.   Lab Results  Component Value Date   CREATININE 0.93 06/05/2022   BUN 13 06/05/2022   NA 140 06/05/2022   K 4.8 06/05/2022   CL 101 06/05/2022   CO2 21 06/05/2022   Lab Results  Component Value Date   ALT 18 06/05/2022   AST 18 06/05/2022   ALKPHOS 76 06/05/2022   BILITOT 0.3 06/05/2022   Lab Results  Component Value Date   HGBA1C 4.9 06/05/2022   Lab Results  Component Value Date   INSULIN 8.6 06/05/2022   Lab Results  Component Value Date   TSH 0.051 (L) 06/05/2022   Lab Results  Component Value Date   CHOL 243 (H) 04/08/2022   HDL 47 (L)  04/08/2022   LDLCALC 147 (H) 04/08/2022   TRIG 317 (H) 04/08/2022   CHOLHDL 5.2 (H) 04/08/2022   Lab Results  Component Value Date   VD25OH 35.4 06/05/2022   VD25OH 43 09/17/2021   VD25OH 43 01/30/2021   Lab Results  Component Value Date   WBC 6.7 02/04/2022   HGB 13.6 02/04/2022   HCT 39.7 02/04/2022   MCV 89.8 02/04/2022   PLT 255 02/04/2022   Lab Results  Component Value Date   IRON 96 02/04/2022   TIBC 377 02/04/2022   FERRITIN 292 02/04/2022    Attestation Statements:   Reviewed by clinician on day of visit: allergies, medications, problem list, medical history, surgical history, family history, social history, and previous encounter notes.   Wilhemena Durie, am acting as Location manager for [employer], DO.  I have reviewed the above documentation for accuracy and completeness, and I agree with the above. Jearld Lesch, DO

## 2022-07-24 ENCOUNTER — Encounter (INDEPENDENT_AMBULATORY_CARE_PROVIDER_SITE_OTHER): Payer: Self-pay | Admitting: Bariatrics

## 2022-07-31 ENCOUNTER — Ambulatory Visit (INDEPENDENT_AMBULATORY_CARE_PROVIDER_SITE_OTHER): Payer: No Typology Code available for payment source | Admitting: Bariatrics

## 2022-07-31 ENCOUNTER — Encounter (INDEPENDENT_AMBULATORY_CARE_PROVIDER_SITE_OTHER): Payer: Self-pay | Admitting: Bariatrics

## 2022-07-31 VITALS — BP 110/73 | HR 72 | Temp 98.1°F | Ht 63.0 in | Wt 170.0 lb

## 2022-07-31 DIAGNOSIS — E782 Mixed hyperlipidemia: Secondary | ICD-10-CM

## 2022-07-31 DIAGNOSIS — E669 Obesity, unspecified: Secondary | ICD-10-CM | POA: Diagnosis not present

## 2022-07-31 DIAGNOSIS — E039 Hypothyroidism, unspecified: Secondary | ICD-10-CM | POA: Diagnosis not present

## 2022-07-31 DIAGNOSIS — Z6830 Body mass index (BMI) 30.0-30.9, adult: Secondary | ICD-10-CM

## 2022-08-07 ENCOUNTER — Encounter (INDEPENDENT_AMBULATORY_CARE_PROVIDER_SITE_OTHER): Payer: Self-pay

## 2022-08-08 NOTE — Progress Notes (Signed)
Chief Complaint:   OBESITY Jacqueline Wilson is here to discuss her progress with her obesity treatment plan along with follow-up of her obesity related diagnoses. Jacqueline Wilson is on the Category 1 Plan and states she is following her eating plan approximately 85% of the time. Jacqueline Wilson states she is walking and doing HIIT training for 2-2.5 hours 1-2 times per week.  Today's visit was #: 4 Starting weight: 178 lbs Starting date: 06/05/2022 Today's weight: 170 lbs Today's date: 07/31/22 Total lbs lost to date: 8 Total lbs lost since last in-office visit: -4  Interim History: She states that she is exercising more (2 hours a day).  She is sleeping well.  Subjective:   1. Mixed hyperlipidemia Not on medication.  2. Hypothyroidism, unspecified type TSH decreased.  Adjusted medication.  Assessment/Plan:   1. Mixed hyperlipidemia 1.  Increase PUFAs and MUFAs.  2. Hypothyroidism, unspecified type 1. Recheck in 1 month.  3. Obesity, current BMI 30.2 1. Increase water intake 2.  Increase calories 3.  Increase vegetables, fiber  Jacqueline Wilson is currently in the action stage of change. As such, her goal is to continue with weight loss efforts. She has agreed to keeping a food journal and adhering to recommended goals of 1300 calories and 80-110 gms protein.   Exercise goals: as is.  Behavioral modification strategies: increasing lean protein intake, decreasing simple carbohydrates, increasing vegetables, increasing water intake, decreasing eating out, no skipping meals, meal planning and cooking strategies, keeping healthy foods in the home, and planning for success.  Jacqueline Wilson has agreed to follow-up with our clinic in 2-3 weeks. She was informed of the importance of frequent follow-up visits to maximize her success with intensive lifestyle modifications for her multiple health conditions.   Objective:   Blood pressure 110/73, pulse 72, temperature 98.1 F (36.7 C), height '5\' 3"'$  (1.6 m), weight 170 lb (77.1 kg),  SpO2 98 %. Body mass index is 30.11 kg/m.  General: Cooperative, alert, well developed, in no acute distress. HEENT: Conjunctivae and lids unremarkable. Cardiovascular: Regular rhythm.  Lungs: Normal work of breathing. Neurologic: No focal deficits.   Lab Results  Component Value Date   CREATININE 0.93 06/05/2022   BUN 13 06/05/2022   NA 140 06/05/2022   K 4.8 06/05/2022   CL 101 06/05/2022   CO2 21 06/05/2022   Lab Results  Component Value Date   ALT 18 06/05/2022   AST 18 06/05/2022   ALKPHOS 76 06/05/2022   BILITOT 0.3 06/05/2022   Lab Results  Component Value Date   HGBA1C 4.9 06/05/2022   Lab Results  Component Value Date   INSULIN 8.6 06/05/2022   Lab Results  Component Value Date   TSH 0.051 (L) 06/05/2022   Lab Results  Component Value Date   CHOL 243 (H) 04/08/2022   HDL 47 (L) 04/08/2022   LDLCALC 147 (H) 04/08/2022   TRIG 317 (H) 04/08/2022   CHOLHDL 5.2 (H) 04/08/2022   Lab Results  Component Value Date   VD25OH 35.4 06/05/2022   VD25OH 43 09/17/2021   VD25OH 43 01/30/2021   Lab Results  Component Value Date   WBC 6.7 02/04/2022   HGB 13.6 02/04/2022   HCT 39.7 02/04/2022   MCV 89.8 02/04/2022   PLT 255 02/04/2022   Lab Results  Component Value Date   IRON 96 02/04/2022   TIBC 377 02/04/2022   FERRITIN 292 02/04/2022    Attestation Statements:   Reviewed by clinician on day of visit: allergies, medications,  problem list, medical history, surgical history, family history, social history, and previous encounter notes.  I, Dawn Whitmire, FNP-C, am acting as transcriptionist for Dr. Jearld Lesch.  I have reviewed the above documentation for accuracy and completeness, and I agree with the above. Jearld Lesch, DO

## 2022-08-10 ENCOUNTER — Encounter (INDEPENDENT_AMBULATORY_CARE_PROVIDER_SITE_OTHER): Payer: Self-pay | Admitting: Bariatrics

## 2022-08-13 ENCOUNTER — Telehealth (INDEPENDENT_AMBULATORY_CARE_PROVIDER_SITE_OTHER): Payer: No Typology Code available for payment source | Admitting: Psychology

## 2022-08-21 ENCOUNTER — Encounter (INDEPENDENT_AMBULATORY_CARE_PROVIDER_SITE_OTHER): Payer: Self-pay | Admitting: Family Medicine

## 2022-08-21 ENCOUNTER — Other Ambulatory Visit (INDEPENDENT_AMBULATORY_CARE_PROVIDER_SITE_OTHER): Payer: Self-pay | Admitting: Bariatrics

## 2022-08-21 ENCOUNTER — Ambulatory Visit (INDEPENDENT_AMBULATORY_CARE_PROVIDER_SITE_OTHER): Payer: No Typology Code available for payment source | Admitting: Family Medicine

## 2022-08-21 VITALS — BP 127/86 | HR 71 | Temp 97.9°F | Ht 63.0 in | Wt 171.0 lb

## 2022-08-21 DIAGNOSIS — Z6830 Body mass index (BMI) 30.0-30.9, adult: Secondary | ICD-10-CM

## 2022-08-21 DIAGNOSIS — E039 Hypothyroidism, unspecified: Secondary | ICD-10-CM | POA: Diagnosis not present

## 2022-08-21 DIAGNOSIS — E669 Obesity, unspecified: Secondary | ICD-10-CM | POA: Diagnosis not present

## 2022-08-21 DIAGNOSIS — G932 Benign intracranial hypertension: Secondary | ICD-10-CM | POA: Diagnosis not present

## 2022-08-21 DIAGNOSIS — E559 Vitamin D deficiency, unspecified: Secondary | ICD-10-CM

## 2022-08-29 NOTE — Progress Notes (Signed)
Chief Complaint:   OBESITY Jacqueline Wilson is here to discuss her progress with her obesity treatment plan along with follow-up of her obesity related diagnoses. Jacqueline Wilson is on keeping a food journal and adhering to recommended goals of 1300 calories and 80-110 grams of protein daily and states she is following her eating plan approximately 40% of the time. Jacqueline Wilson states she is doing 0 minutes 0 times per week.  Today's visit was #: 5 Starting weight: 178 lbs Starting date: 06/05/2022 Today's weight: 171 lbs Today's date: 08/21/2022 Total lbs lost to date: 7 Total lbs lost since last in-office visit: 0  Interim History: Jacqueline Wilson has done well with weight loss overall.  She has been working very long hours, which has made meal planning difficult.  She has had challenges with thyroid issues and elevated intracranial pressure.  She has done significant exercise in the past and she is planning on increasing strengthening exercises again soon.  Subjective:   1. Hypothyroidism, unspecified type Jacqueline Wilson is status post thyroidectomy, and she has been vacillating up and down for approximately 5 years.  She is trying to get into Endocrinology, but she has not been called back yet for an appointment.  2. Elevated intracranial pressure Jacqueline Wilson notes weight gain has increased headaches, and for preventing her weight needed to be 165 pounds or lower to decrease headaches.  She is doing well with her weight loss efforts.  Assessment/Plan:   1. Hypothyroidism, unspecified type We will refer to [facility]. Jacqueline Wilson also was given the number to call herself.   - Ambulatory referral to Endocrinology  2. Elevated intracranial pressure Jacqueline Wilson will continue with her diet and weight loss as is, and our next goal will be 165 lbs, but with no muscle loss.   3. Obesity, current BMI 30.3 Jacqueline Wilson is currently in the action stage of change. As such, her goal is to continue with weight loss efforts. She has agreed to keeping a food  journal and adhering to recommended goals of 1300-1400 calories and 80+ grams of protein daily.   Behavioral modification strategies: increasing lean protein intake.  Jacqueline Wilson has agreed to follow-up with our clinic in 2 to 3 weeks. She was informed of the importance of frequent follow-up visits to maximize her success with intensive lifestyle modifications for her multiple health conditions.   Objective:   Blood pressure 127/86, pulse 71, temperature 97.9 F (36.6 C), height '5\' 3"'$  (1.6 m), weight 171 lb (77.6 kg), SpO2 98 %. Body mass index is 30.29 kg/m.  General: Cooperative, alert, well developed, in no acute distress. HEENT: Conjunctivae and lids unremarkable. Cardiovascular: Regular rhythm.  Lungs: Normal work of breathing. Neurologic: No focal deficits.   Lab Results  Component Value Date   CREATININE 0.93 06/05/2022   BUN 13 06/05/2022   NA 140 06/05/2022   K 4.8 06/05/2022   CL 101 06/05/2022   CO2 21 06/05/2022   Lab Results  Component Value Date   ALT 18 06/05/2022   AST 18 06/05/2022   ALKPHOS 76 06/05/2022   BILITOT 0.3 06/05/2022   Lab Results  Component Value Date   HGBA1C 4.9 06/05/2022   Lab Results  Component Value Date   INSULIN 8.6 06/05/2022   Lab Results  Component Value Date   TSH 0.051 (L) 06/05/2022   Lab Results  Component Value Date   CHOL 243 (H) 04/08/2022   HDL 47 (L) 04/08/2022   LDLCALC 147 (H) 04/08/2022   TRIG 317 (H) 04/08/2022  CHOLHDL 5.2 (H) 04/08/2022   Lab Results  Component Value Date   VD25OH 35.4 06/05/2022   VD25OH 43 09/17/2021   VD25OH 43 01/30/2021   Lab Results  Component Value Date   WBC 6.7 02/04/2022   HGB 13.6 02/04/2022   HCT 39.7 02/04/2022   MCV 89.8 02/04/2022   PLT 255 02/04/2022   Lab Results  Component Value Date   IRON 96 02/04/2022   TIBC 377 02/04/2022   FERRITIN 292 02/04/2022   Attestation Statements:   Reviewed by clinician on day of visit: allergies, medications, problem list,  medical history, surgical history, family history, social history, and previous encounter notes.  Time spent on visit including pre-visit chart review and post-visit care and charting was 41 minutes  I, Trixie Dredge, am acting as transcriptionist for Dennard Nip, MD.  I have reviewed the above documentation for accuracy and completeness, and I agree with the above. -  Dennard Nip, MD

## 2022-09-16 ENCOUNTER — Other Ambulatory Visit (INDEPENDENT_AMBULATORY_CARE_PROVIDER_SITE_OTHER): Payer: Self-pay | Admitting: Bariatrics

## 2022-09-16 DIAGNOSIS — E559 Vitamin D deficiency, unspecified: Secondary | ICD-10-CM

## 2022-09-23 ENCOUNTER — Encounter (INDEPENDENT_AMBULATORY_CARE_PROVIDER_SITE_OTHER): Payer: Self-pay | Admitting: Family Medicine

## 2022-09-23 ENCOUNTER — Ambulatory Visit (INDEPENDENT_AMBULATORY_CARE_PROVIDER_SITE_OTHER): Payer: No Typology Code available for payment source | Admitting: Family Medicine

## 2022-09-23 VITALS — BP 122/82 | HR 80 | Temp 98.0°F | Ht 63.0 in | Wt 172.0 lb

## 2022-09-23 DIAGNOSIS — F3289 Other specified depressive episodes: Secondary | ICD-10-CM

## 2022-09-23 DIAGNOSIS — E559 Vitamin D deficiency, unspecified: Secondary | ICD-10-CM

## 2022-09-23 DIAGNOSIS — E669 Obesity, unspecified: Secondary | ICD-10-CM | POA: Diagnosis not present

## 2022-09-23 DIAGNOSIS — Z6830 Body mass index (BMI) 30.0-30.9, adult: Secondary | ICD-10-CM

## 2022-09-23 DIAGNOSIS — F32A Depression, unspecified: Secondary | ICD-10-CM | POA: Insufficient documentation

## 2022-09-23 MED ORDER — BUPROPION HCL ER (SR) 150 MG PO TB12: 150.0000 mg | ORAL_TABLET | Freq: Every day | ORAL | 0 refills | Status: DC

## 2022-09-23 MED ORDER — VITAMIN D (ERGOCALCIFEROL) 1.25 MG (50000 UNIT) PO CAPS: 50000.0000 [IU] | ORAL_CAPSULE | ORAL | 0 refills | Status: DC

## 2022-09-25 NOTE — Progress Notes (Unsigned)
Chief Complaint:   OBESITY Jacqueline Wilson is here to discuss her progress with her obesity treatment plan along with follow-up of her obesity related diagnoses. Jacqueline Wilson is on {MWMwtlossportion/plan2:23431} and states she is following her eating plan approximately ***% of the time. Jacqueline Wilson states she is *** *** minutes *** times per week.  Today's visit was #: *** Starting weight: *** Starting date: *** Today's weight: *** Today's date: 09/23/2022 Total lbs lost to date: *** Total lbs lost since last in-office visit: ***  Interim History: ***  Subjective:   1. Vitamin D deficiency ***  2. Other depression, emotional eating behaviors ***  Assessment/Plan:   1. Vitamin D deficiency *** - Vitamin D, Ergocalciferol, (DRISDOL) 1.25 MG (50000 UNIT) CAPS capsule; Take 1 capsule (50,000 Units total) by mouth every 7 (seven) days.  Dispense: 5 capsule; Refill: 0  2. Other depression, emotional eating behaviors *** - buPROPion (WELLBUTRIN SR) 150 MG 12 hr tablet; Take 1 tablet (150 mg total) by mouth daily.  Dispense: 30 tablet; Refill: 0  3. Obesity, Current BMI 30.6 Jacqueline Wilson {CHL AMB IS/IS NOT:210130109} currently in the action stage of change. As such, her goal is to {MWMwtloss#1:210800005}. She has agreed to {MWMwtlossportion/plan2:23431}.   Exercise goals: {MWM EXERCISE RECS:23473}  Behavioral modification strategies: {MWMwtlossdietstrategies3:23432}.  Jacqueline Wilson has agreed to follow-up with our clinic in {NUMBER 1-10:22536} weeks. She was informed of the importance of frequent follow-up visits to maximize her success with intensive lifestyle modifications for her multiple health conditions.   Objective:   Blood pressure 122/82, pulse 80, temperature 98 F (36.7 C), height '5\' 3"'$  (1.6 m), weight 172 lb (78 kg), SpO2 98 %. Body mass index is 30.47 kg/m.  General: Cooperative, alert, well developed, in no acute distress. HEENT: Conjunctivae and lids unremarkable. Cardiovascular: Regular  rhythm.  Lungs: Normal work of breathing. Neurologic: No focal deficits.   Lab Results  Component Value Date   CREATININE 0.93 06/05/2022   BUN 13 06/05/2022   NA 140 06/05/2022   K 4.8 06/05/2022   CL 101 06/05/2022   CO2 21 06/05/2022   Lab Results  Component Value Date   ALT 18 06/05/2022   AST 18 06/05/2022   ALKPHOS 76 06/05/2022   BILITOT 0.3 06/05/2022   Lab Results  Component Value Date   HGBA1C 4.9 06/05/2022   Lab Results  Component Value Date   INSULIN 8.6 06/05/2022   Lab Results  Component Value Date   TSH 0.051 (L) 06/05/2022   Lab Results  Component Value Date   CHOL 243 (H) 04/08/2022   HDL 47 (L) 04/08/2022   LDLCALC 147 (H) 04/08/2022   TRIG 317 (H) 04/08/2022   CHOLHDL 5.2 (H) 04/08/2022   Lab Results  Component Value Date   VD25OH 35.4 06/05/2022   VD25OH 43 09/17/2021   VD25OH 43 01/30/2021   Lab Results  Component Value Date   WBC 6.7 02/04/2022   HGB 13.6 02/04/2022   HCT 39.7 02/04/2022   MCV 89.8 02/04/2022   PLT 255 02/04/2022   Lab Results  Component Value Date   IRON 96 02/04/2022   TIBC 377 02/04/2022   FERRITIN 292 02/04/2022   Attestation Statements:   Reviewed by clinician on day of visit: allergies, medications, problem list, medical history, surgical history, family history, social history, and previous encounter notes.   I, Trixie Dredge, am acting as transcriptionist for Dennard Nip, MD.  I have reviewed the above documentation for accuracy and completeness, and I agree with  the above. -  ***

## 2022-10-07 ENCOUNTER — Ambulatory Visit: Payer: No Typology Code available for payment source | Admitting: Family

## 2022-10-07 ENCOUNTER — Encounter: Payer: Self-pay | Admitting: Family

## 2022-10-07 ENCOUNTER — Ambulatory Visit (INDEPENDENT_AMBULATORY_CARE_PROVIDER_SITE_OTHER): Payer: No Typology Code available for payment source | Admitting: Family Medicine

## 2022-10-07 ENCOUNTER — Encounter (INDEPENDENT_AMBULATORY_CARE_PROVIDER_SITE_OTHER): Payer: Self-pay

## 2022-10-07 ENCOUNTER — Encounter (INDEPENDENT_AMBULATORY_CARE_PROVIDER_SITE_OTHER): Payer: Self-pay | Admitting: Internal Medicine

## 2022-10-07 ENCOUNTER — Ambulatory Visit (INDEPENDENT_AMBULATORY_CARE_PROVIDER_SITE_OTHER): Payer: No Typology Code available for payment source | Admitting: Internal Medicine

## 2022-10-07 VITALS — BP 106/75 | HR 83 | Temp 98.6°F | Resp 16 | Ht 63.0 in | Wt 174.2 lb

## 2022-10-07 VITALS — BP 106/75 | HR 71 | Temp 98.6°F | Ht 63.0 in | Wt 169.8 lb

## 2022-10-07 DIAGNOSIS — Z3009 Encounter for other general counseling and advice on contraception: Secondary | ICD-10-CM | POA: Diagnosis not present

## 2022-10-07 DIAGNOSIS — E039 Hypothyroidism, unspecified: Secondary | ICD-10-CM

## 2022-10-07 DIAGNOSIS — E785 Hyperlipidemia, unspecified: Secondary | ICD-10-CM | POA: Diagnosis not present

## 2022-10-07 DIAGNOSIS — E782 Mixed hyperlipidemia: Secondary | ICD-10-CM

## 2022-10-07 DIAGNOSIS — Z6830 Body mass index (BMI) 30.0-30.9, adult: Secondary | ICD-10-CM

## 2022-10-07 DIAGNOSIS — G43839 Menstrual migraine, intractable, without status migrainosus: Secondary | ICD-10-CM

## 2022-10-07 DIAGNOSIS — E669 Obesity, unspecified: Secondary | ICD-10-CM

## 2022-10-07 DIAGNOSIS — F5101 Primary insomnia: Secondary | ICD-10-CM

## 2022-10-07 DIAGNOSIS — R638 Other symptoms and signs concerning food and fluid intake: Secondary | ICD-10-CM

## 2022-10-07 DIAGNOSIS — G43709 Chronic migraine without aura, not intractable, without status migrainosus: Secondary | ICD-10-CM

## 2022-10-07 DIAGNOSIS — E559 Vitamin D deficiency, unspecified: Secondary | ICD-10-CM | POA: Diagnosis not present

## 2022-10-07 MED ORDER — LO LOESTRIN FE 1 MG-10 MCG / 10 MCG PO TABS: ORAL_TABLET | ORAL | 1 refills | Status: DC

## 2022-10-07 MED ORDER — VITAMIN D (ERGOCALCIFEROL) 1.25 MG (50000 UNIT) PO CAPS: 50000.0000 [IU] | ORAL_CAPSULE | ORAL | 0 refills | Status: DC

## 2022-10-07 MED ORDER — LEVOTHYROXINE SODIUM 150 MCG PO TABS: ORAL_TABLET | ORAL | 1 refills | Status: DC

## 2022-10-07 MED ORDER — NORTRIPTYLINE HCL 10 MG PO CAPS: ORAL_CAPSULE | ORAL | 1 refills | Status: DC

## 2022-10-07 MED ORDER — ZOLPIDEM TARTRATE 5 MG PO TABS: 5.0000 mg | ORAL_TABLET | Freq: Every day | ORAL | 0 refills | Status: DC

## 2022-10-07 NOTE — Progress Notes (Signed)
Established Patient Office Visit  Subjective:  Patient ID: Jacqueline Wilson, female    DOB: [birth year]  Age: 41 y.o. MRN: [id number]  CC:  Chief Complaint  Patient presents with   Medication Refill    HPI Jacqueline Wilson is here today for follow up.   Working with weight loss center, and she is doing well.  Wt Readings from Last 3 Encounters:  10/07/22 174 lb 4 oz (79 kg)  10/07/22 169 lb 12.8 oz (77 kg)  09/23/22 172 lb (78 kg)    Increased stress, husband is the cook in the house and recently broke his foot, and she has een eating a bit more unhealthy and weight loss plateaud. Weight loss center did start her on wellbutirn but had bad s/e.     Past Medical History:  Diagnosis Date   Allergy    BRCA negative 08/2018   MyRisk neg except PALB2 VUS   Colon polyp 02/2016   colonoscopy; repeat due in 3 yrs   Constipation    Elevated ferritin    Family history of breast cancer 08/2018   IBIS=18.7%   History of goiter    thryoidectomy   Hyperlipidemia    Hypothyroidism    Internal hemorrhoids    Intracranial hypertension 12/31/2011   Migraine    without aura and menstrual migraine with aura; sees neuro   Pre-eclampsia    Thyroid disease    Thryoidectomy   Vitamin D deficiency     Past Surgical History:  Procedure Laterality Date   CESAREAN SECTION     COLONOSCOPY  03/27/2016   polyp, internal hemorrhoid; repeat in 3 yrs.   HEMORRHOID BANDING     POLYPECTOMY     THYROIDECTOMY  2010   treat benign goiter    Family History  Problem Relation Age of Onset   Obesity Mother    Thyroid disease Mother    Drug abuse Mother    Heart disease Father    COPD Father        smoker   Lung cancer Father    High blood pressure Father    High Cholesterol Father    Anxiety disorder Father    Thyroid disease Maternal Grandmother    Breast cancer Maternal Grandmother 50   Lung cancer Paternal Uncle    Colon cancer Paternal Uncle    Esophageal cancer Neg Hx    Migraines Neg  Hx    Ovarian cancer Neg Hx    Colon polyps Neg Hx     Social History   Socioeconomic History   Marital status: Married    Spouse name: Rania Prothero   Number of children: 1   Years of education: 18   Highest education level: Master's degree (e.g., MA, MS, MEng, MEd, MSW, MBA)  Occupational History   Occupation: Pharmacologist    Occupation: Public librarian [employer]    Comment: OCD and related disorders   Occupation: banking  Tobacco Use   Smoking status: Never    Passive exposure: Never   Smokeless tobacco: Never  Vaping Use   Vaping Use: Never used  Substance and Sexual Activity   Alcohol use: Not Currently    Alcohol/week: 0.0 standard drinks of alcohol   Drug use: Never   Sexual activity: Yes    Partners: Male    Birth control/protection: I.U.D.  Other Topics Concern   Not on file  Social History Narrative   [name]   41 years old-2023   Social Determinants of Health  Financial Resource Strain: Not on file  Food Insecurity: Not on file  Transportation Needs: Not on file  Physical Activity: Not on file  Stress: Not on file  Social Connections: Not on file  Intimate Partner Violence: Not on file    Outpatient Medications Prior to Visit  Medication Sig Dispense Refill   albuterol (VENTOLIN HFA) 108 (90 Base) MCG/ACT inhaler TAKE 2 PUFFS BY MOUTH EVERY 4-6 HOURS AS NEEDED FOR WHEEZE OR SHORTNESS OF BREATH 18 g 1   cetirizine (ZYRTEC) 10 MG tablet Take 10 mg by mouth daily.     EPINEPHrine 0.3 mg/0.3 mL IJ SOAJ injection Inject 0.3 mg into the muscle as needed for anaphylaxis. 1 each 0   fluticasone (FLOVENT HFA) 110 MCG/ACT inhaler Inhale 2 puffs twice a day with spacer to help prevent cough and wheeze. 1 each 3   ibuprofen (ADVIL) 200 MG tablet Take 400 mg by mouth every 6 (six) hours as needed.     Inulin (FIBER CHOICE PO) Take 5 capsules by mouth at bedtime.     Melatonin 2.5 MG CAPS Take 1 capsule by mouth at bedtime.     Multiple  Vitamin (MULTIVITAMIN) tablet Take 1 tablet by mouth daily.     Vitamin D, Cholecalciferol, 25 MCG (1000 UT) CAPS Take 1 capsule by mouth daily.     Vitamin D, Ergocalciferol, (DRISDOL) 1.25 MG (50000 UNIT) CAPS capsule Take 1 capsule (50,000 Units total) by mouth every 7 (seven) days. 5 capsule 0   buPROPion (WELLBUTRIN SR) 150 MG 12 hr tablet Take 1 tablet (150 mg total) by mouth daily. 30 tablet 0   Norethindrone-Ethinyl Estradiol-Fe Biphas (LO LOESTRIN FE) 1 MG-10 MCG / 10 MCG tablet TAKE 1 TABLET BY MOUTH DAILY. CONTINUOUS DOSING 84 tablet 1   nortriptyline (PAMELOR) 10 MG capsule TAKE 1 CAPSULE(10 MG) BY MOUTH AT BEDTIME 90 capsule 1   levothyroxine (SYNTHROID) 150 MCG tablet Take 1 tablet (150 mcg total) by mouth daily before breakfast. 90 tablet 1   zolpidem (AMBIEN) 5 MG tablet Take 1 tablet (5 mg total) by mouth at bedtime. Take one po qhs prn insomnia 30 tablet 0   No facility-administered medications prior to visit.    Allergies  Allergen Reactions   Flonase [Fluticasone Propionate] Anaphylaxis    Has alcohol in nasal spray   Hydrocodone-Acetaminophen Other (See Comments)    Suicidal    Latex Anaphylaxis   Rubbing Alcohol [Alcohol] Anaphylaxis   Topamax [Topiramate] Other (See Comments)    HI/SI   Bean Pod Extract Swelling    Butter Bean, Baked Bean: Facial Swelling   Hydrocodone     Suicidal thoughts with medication   Mucinex [Guaifenesin Er] Other (See Comments)    Lose hearing for 1-2 weeks.   Penicillins    Wellbutrin [Bupropion] Other (See Comments)    Extreme insomnia   Cephalexin Rash          Objective:    Physical Exam Constitutional:      General: She is not in acute distress.    Appearance: Normal appearance. She is obese. She is not ill-appearing, toxic-appearing or diaphoretic.  Cardiovascular:     Rate and Rhythm: Normal rate and regular rhythm.  Pulmonary:     Effort: Pulmonary effort is normal.  Musculoskeletal:     Right lower leg: No  edema.     Left lower leg: No edema.  Neurological:     General: No focal deficit present.     Mental Status: She  is alert and oriented to person, place, and time. Mental status is at baseline.  Psychiatric:        Mood and Affect: Mood normal.        Behavior: Behavior normal.        Thought Content: Thought content normal.        Judgment: Judgment normal.      BP 106/75   Pulse 83   Temp 98.6 F (37 C)   Resp 16   Ht 5' 3" (1.6 m)   Wt 174 lb 4 oz (79 kg)   SpO2 97%   BMI 30.87 kg/m  Wt Readings from Last 3 Encounters:  10/07/22 174 lb 4 oz (79 kg)  10/07/22 169 lb 12.8 oz (77 kg)  09/23/22 172 lb (78 kg)     Health Maintenance Due  Topic Date Due   HIV Screening  Never done   Hepatitis C Screening  Never done   COVID-19 Vaccine (4 - Moderna risk series) 01/26/2021   INFLUENZA VACCINE  07/30/2022   PAP SMEAR-Modifier  09/17/2022    There are no preventive care reminders to display for this patient.  Lab Results  Component Value Date   TSH 0.051 (L) 06/05/2022   Lab Results  Component Value Date   WBC 6.7 02/04/2022   HGB 13.6 02/04/2022   HCT 39.7 02/04/2022   MCV 89.8 02/04/2022   PLT 255 02/04/2022   Lab Results  Component Value Date   NA 140 06/05/2022   K 4.8 06/05/2022   CO2 21 06/05/2022   GLUCOSE 88 06/05/2022   BUN 13 06/05/2022   CREATININE 0.93 06/05/2022   BILITOT 0.3 06/05/2022   ALKPHOS 76 06/05/2022   AST 18 06/05/2022   ALT 18 06/05/2022   PROT 6.9 06/05/2022   ALBUMIN 4.4 06/05/2022   CALCIUM 9.3 06/05/2022   EGFR 80 06/05/2022   Lab Results  Component Value Date   CHOL 243 (H) 04/08/2022   Lab Results  Component Value Date   HDL 47 (L) 04/08/2022   Lab Results  Component Value Date   LDLCALC 147 (H) 04/08/2022   Lab Results  Component Value Date   TRIG 317 (H) 04/08/2022   Lab Results  Component Value Date   CHOLHDL 5.2 (H) 04/08/2022   Lab Results  Component Value Date   HGBA1C 4.9 06/05/2022       Assessment & Plan:   Problem List Items Addressed This Visit       Cardiovascular and Mediastinum   Chronic migraine without aura    Refill pamelor 10 mg once daily  stable      Relevant Medications   nortriptyline (PAMELOR) 10 MG capsule   RESOLVED: Intractable menstrual migraine without status migrainosus   Relevant Medications   Norethindrone-Ethinyl Estradiol-Fe Biphas (LO LOESTRIN FE) 1 MG-10 MCG / 10 MCG tablet   nortriptyline (PAMELOR) 10 MG capsule     Endocrine   Hypothyroidism    tsh today pending results Continue synthroid brand name 150 mcg once daily x 6 days off seventh day  rx sent to pharmacy       Relevant Medications   levothyroxine (SYNTHROID) 150 MCG tablet   Other Relevant Orders   T4, free   TSH     Other   Insomnia    pdmp reviewed Refill ambien 5 mg once nightly prn insomnia      Relevant Medications   zolpidem (AMBIEN) 5 MG tablet   Mixed hyperlipidemia  Ordered lipid panel, pending results. Work on low cholesterol diet and exercise as tolerated Pt is fasting      RESOLVED: Hyperlipidemia - Primary   Relevant Orders   Lipid panel   Other Visit Diagnoses     Birth control counseling       Relevant Medications   Norethindrone-Ethinyl Estradiol-Fe Biphas (LO LOESTRIN FE) 1 MG-10 MCG / 10 MCG tablet       Meds ordered this encounter  Medications   levothyroxine (SYNTHROID) 150 MCG tablet    Sig: Take one tablet po qd for six days a week, do not take on the seventh day    Dispense:  88 tablet    Refill:  1    NEEDS BRAND NAME SYNTHROID NOT LEVOTHYROXINE   Norethindrone-Ethinyl Estradiol-Fe Biphas (LO LOESTRIN FE) 1 MG-10 MCG / 10 MCG tablet    Sig: TAKE 1 TABLET BY MOUTH DAILY. CONTINUOUS DOSING    Dispense:  84 tablet    Refill:  1   nortriptyline (PAMELOR) 10 MG capsule    Sig: TAKE 1 CAPSULE(10 MG) BY MOUTH AT BEDTIME    Dispense:  90 capsule    Refill:  1   zolpidem (AMBIEN) 5 MG tablet    Sig: Take 1 tablet (5 mg  total) by mouth at bedtime. Take one po qhs prn insomnia    Dispense:  30 tablet    Refill:  0    Not to exceed 5 additional fills before 10/21/2020    Follow-up: No follow-ups on file.    Eugenia Pancoast, FNP

## 2022-10-07 NOTE — Assessment & Plan Note (Signed)
pdmp reviewed Refill ambien 5 mg once nightly prn insomnia

## 2022-10-07 NOTE — Assessment & Plan Note (Signed)
Refill pamelor 10 mg once daily  stable

## 2022-10-07 NOTE — Assessment & Plan Note (Addendum)
Ordered lipid panel, pending results. Work on low cholesterol diet and exercise as tolerated Pt is fasting

## 2022-10-07 NOTE — Assessment & Plan Note (Signed)
tsh today pending results Continue synthroid brand name 150 mcg once daily x 6 days off seventh day  rx sent to pharmacy

## 2022-10-08 ENCOUNTER — Other Ambulatory Visit: Payer: Self-pay | Admitting: Family

## 2022-10-08 DIAGNOSIS — E039 Hypothyroidism, unspecified: Secondary | ICD-10-CM

## 2022-10-08 LAB — LIPID PANEL
Cholesterol: 239 mg/dL — ABNORMAL HIGH (ref <200)
HDL: 44 mg/dL — ABNORMAL LOW (ref >=50)
LDL Cholesterol (Calc): 148 mg/dL (calc) — ABNORMAL HIGH
Non-HDL Cholesterol (Calc): 195 mg/dL (calc) — ABNORMAL HIGH (ref <130)
Total CHOL/HDL Ratio: 5.4 (calc) — ABNORMAL HIGH (ref <5.0)
Triglycerides: 289 mg/dL — ABNORMAL HIGH (ref <150)

## 2022-10-08 LAB — TSH: TSH: 17.29 mIU/L — ABNORMAL HIGH

## 2022-10-08 LAB — T4, FREE: Free T4: 1.3 ng/dL (ref 0.8–1.8)

## 2022-10-08 NOTE — Progress Notes (Signed)
No real improvement with cholesterol in fact increased from last visit, even with weight loss it is still high. Triglycerides did have slight improvement but still in 200's. I highly suggest we start a  cholesterol medication if pt is willing. Lipitor would be suggested.   Also thyroid level has significantly increased since last visit.  Let's go back to 150 mcg once daily. And repeat tsh in four weeks. TSH is much too high and will have you symptomatic. Please do lab only x four weeks with our office, If ok with you I will manage your thyroid moving forward. Let's also order u/s thyroid I will order this . Does pt prefer [facility] or [place] location to send the order for ultrasound to?   Also ensure pt is taking synthroid separate from other medications by at least one hour. Make sure pt not taking biotin?

## 2022-10-10 ENCOUNTER — Telehealth: Payer: Self-pay | Admitting: Family

## 2022-10-10 ENCOUNTER — Ambulatory Visit: Payer: No Typology Code available for payment source | Admitting: Family

## 2022-10-10 NOTE — Telephone Encounter (Signed)
Patient called back in returning a call she received.  

## 2022-10-11 NOTE — Telephone Encounter (Signed)
Called patient see lab notes for documentation.

## 2022-10-14 NOTE — Progress Notes (Signed)
Please schedule pt for brief video visit so we can go over these concerns and discuss further.

## 2022-10-15 NOTE — Progress Notes (Signed)
Chief Complaint:   OBESITY Jacqueline Wilson is here to discuss her progress with her obesity treatment plan along with follow-up of her obesity related diagnoses. Jacqueline Wilson is on keeping a food journal and adhering to recommended goals of 1300-1400 calories and 80+ grams of protein daily and states she is following her eating plan approximately 75% of the time. Jacqueline Wilson states she is walking for 30 minutes and lifting weights for 60 minutes 3 times per week.  Today's visit was #: 7 Starting weight: 178 lbs Starting date: 06/05/2022 Today's weight: 169 lbs Today's date: 10/17/2022 Total lbs lost to date: 9 Total lbs lost since last in-office visit: 3  Interim History: She noticed improvement in cravings and hunger signals with bupropion, but this also overlapped with her husband beginning to cook again. He does most of the cooking, so she was eating more convenient foods.  Unfortunately she self discontinued  medication due to severe insomnia. She has insomnia due to a traumatic childhood experience. She notes her appetite is well controlled with changes in nutritional intake. She is physically active.   Subjective:   1. Abnormal craving, emotinal eating Jacqueline Wilson did not tolerate bupropion due to insomnia worsening. She feels better control with her nutritional intake. Her husband did most of the cooking until his injury.   2. Vitamin D deficiency Jacqueline Wilson is currently taking prescription vitamin D 50,000 IU each week. She denies nausea, vomiting or muscle weakness.  Assessment/Plan:   1. Abnormal craving, emotinal eating Jacqueline Wilson will continue with her reduced, high protein meal plan. Discontinued bupropion per patient due to side effects.   2. Vitamin D deficiency We will refill prescription Vitamin D for 1 month, and we will recheck her Vitamin D level in December.   - Vitamin D, Ergocalciferol, (DRISDOL) 1.25 MG (50000 UNIT) CAPS capsule; Take 1 capsule (50,000 Units total) by mouth every 7 (seven) days.   Dispense: 5 capsule; Refill: 0  3. Obesity, Current BMI 30.1 Jacqueline Wilson is currently in the action stage of change. As such, her goal is to continue with weight loss efforts. She has agreed to the Category 3 Plan.   Exercise goals: As is.   Behavioral modification strategies: increasing water intake, no skipping meals, and planning for success.  Jacqueline Wilson has agreed to follow-up with our clinic in 3 to 4 weeks. She was informed of the importance of frequent follow-up visits to maximize her success with intensive lifestyle modifications for her multiple health conditions.   Objective:   Blood pressure 106/75, pulse 71, temperature 98.6 F (37 C), height '5\' 3"'$  (1.6 m), weight 169 lb 12.8 oz (77 kg), SpO2 98 %. Body mass index is 30.08 kg/m.  General: Cooperative, alert, well developed, in no acute distress. HEENT: Conjunctivae and lids unremarkable. Cardiovascular: Regular rhythm.  Lungs: Normal work of breathing. Neurologic: No focal deficits.   Lab Results  Component Value Date   CREATININE 0.93 06/05/2022   BUN 13 06/05/2022   NA 140 06/05/2022   K 4.8 06/05/2022   CL 101 06/05/2022   CO2 21 06/05/2022   Lab Results  Component Value Date   ALT 18 06/05/2022   AST 18 06/05/2022   ALKPHOS 76 06/05/2022   BILITOT 0.3 06/05/2022   Lab Results  Component Value Date   HGBA1C 4.9 06/05/2022   Lab Results  Component Value Date   INSULIN 8.6 06/05/2022   Lab Results  Component Value Date   TSH 17.29 (H) 10/07/2022   Lab Results  Component  Value Date   CHOL 239 (H) 10/07/2022   HDL 44 (L) 10/07/2022   LDLCALC 148 (H) 10/07/2022   TRIG 289 (H) 10/07/2022   CHOLHDL 5.4 (H) 10/07/2022   Lab Results  Component Value Date   VD25OH 35.4 06/05/2022   VD25OH 43 09/17/2021   VD25OH 43 01/30/2021   Lab Results  Component Value Date   WBC 6.7 02/04/2022   HGB 13.6 02/04/2022   HCT 39.7 02/04/2022   MCV 89.8 02/04/2022   PLT 255 02/04/2022   Lab Results  Component Value  Date   IRON 96 02/04/2022   TIBC 377 02/04/2022   FERRITIN 292 02/04/2022   Attestation Statements:   Reviewed by clinician on day of visit: allergies, medications, problem list, medical history, surgical history, family history, social history, and previous encounter notes.   Wilhemena Durie, am acting as transcriptionist for Thomes Dinning, MD.  I have reviewed the above documentation for accuracy and completeness, and I agree with the above. -Thomes Dinning, MD

## 2022-10-16 ENCOUNTER — Encounter: Payer: Self-pay | Admitting: Family

## 2022-10-21 ENCOUNTER — Ambulatory Visit (INDEPENDENT_AMBULATORY_CARE_PROVIDER_SITE_OTHER): Payer: No Typology Code available for payment source | Admitting: Family Medicine

## 2022-10-21 ENCOUNTER — Ambulatory Visit (INDEPENDENT_AMBULATORY_CARE_PROVIDER_SITE_OTHER): Payer: No Typology Code available for payment source | Admitting: Internal Medicine

## 2022-10-21 ENCOUNTER — Encounter (INDEPENDENT_AMBULATORY_CARE_PROVIDER_SITE_OTHER): Payer: Self-pay | Admitting: Family Medicine

## 2022-10-21 VITALS — BP 106/75 | HR 70 | Temp 98.4°F | Ht 63.0 in | Wt 171.0 lb

## 2022-10-21 DIAGNOSIS — E890 Postprocedural hypothyroidism: Secondary | ICD-10-CM

## 2022-10-21 DIAGNOSIS — E7849 Other hyperlipidemia: Secondary | ICD-10-CM | POA: Diagnosis not present

## 2022-10-21 DIAGNOSIS — K5909 Other constipation: Secondary | ICD-10-CM

## 2022-10-21 DIAGNOSIS — Z6830 Body mass index (BMI) 30.0-30.9, adult: Secondary | ICD-10-CM

## 2022-10-21 DIAGNOSIS — E66811 Obesity, class 1: Secondary | ICD-10-CM

## 2022-10-21 DIAGNOSIS — E559 Vitamin D deficiency, unspecified: Secondary | ICD-10-CM | POA: Diagnosis not present

## 2022-10-21 DIAGNOSIS — E669 Obesity, unspecified: Secondary | ICD-10-CM

## 2022-10-21 MED ORDER — VITAMIN D (ERGOCALCIFEROL) 1.25 MG (50000 UNIT) PO CAPS: 50000.0000 [IU] | ORAL_CAPSULE | ORAL | 0 refills | Status: DC

## 2022-10-23 ENCOUNTER — Telehealth (INDEPENDENT_AMBULATORY_CARE_PROVIDER_SITE_OTHER): Payer: No Typology Code available for payment source | Admitting: Family

## 2022-10-23 ENCOUNTER — Encounter: Payer: Self-pay | Admitting: Family

## 2022-10-23 VITALS — Ht 63.0 in | Wt 171.0 lb

## 2022-10-23 DIAGNOSIS — E038 Other specified hypothyroidism: Secondary | ICD-10-CM | POA: Diagnosis not present

## 2022-10-23 DIAGNOSIS — E782 Mixed hyperlipidemia: Secondary | ICD-10-CM

## 2022-10-23 MED ORDER — LEVOTHYROXINE SODIUM 175 MCG PO TABS: ORAL_TABLET | ORAL | 0 refills | Status: DC

## 2022-10-23 MED ORDER — LEVOTHYROXINE SODIUM 150 MCG PO TABS: ORAL_TABLET | ORAL | 3 refills | Status: DC

## 2022-10-23 NOTE — Assessment & Plan Note (Signed)
Pt advised of the following: Start red yeast rice and daily fish oils.  F/u in office in three months , and come fasting for repeat labs.  Work on [employer] and exercise as tolerated.

## 2022-10-23 NOTE — Assessment & Plan Note (Signed)
Start synthroid 175 mcg once daily for five days, and take 150 mcg synthroid Saturday and Sunday. This will be about a dose of 167 daily. We will repeat tsh in four weeks.

## 2022-10-23 NOTE — Progress Notes (Signed)
MyChart Video Visit    Virtual Visit via Video Note   This visit type was conducted due to national recommendations for restrictions regarding the COVID-19 Pandemic (e.g. social distancing) in an effort to limit this patient's exposure and mitigate transmission in our community. This patient is at least at moderate risk for complications without adequate follow up. This format is felt to be most appropriate for this patient at this time. Physical exam was limited by quality of the video and audio technology used for the visit. CMA was able to get the patient set up on a video visit.  Patient location: Home. Patient and provider in visit Provider location: Office  I discussed the limitations of evaluation and management by telemedicine and the availability of in person appointments. The patient expressed understanding and agreed to proceed.  Visit Date: 10/23/2022  Today's healthcare provider: Eugenia Pancoast, FNP     Subjective:    Patient ID: Jacqueline Wilson, female    DOB: Aug 02, 1981, 41 y.o.   MRN: [id number]  No chief complaint on file.   HPI  Pt here today via video visit with concerns.   Hypothyroid: pt is currently taking 150 mcg once daily x 6 days and skipping Sundays. She is feeling very fatigued with increased constipation and hard to breath at times. She feels pretty lethargic, over the last two days with slight improvement in symptoms. She has started her levothyroxine 175 mcg once daily  Lab Results  Component Value Date   TSH 17.29 (H) 10/07/2022   Hyperlipidemia: is going to start taking red yeast rice and fish oil daily, has started for the last three days.   Past Medical History:  Diagnosis Date  . Allergy   . BRCA negative 08/2018   MyRisk neg except PALB2 VUS  . Colon polyp 02/2016   colonoscopy; repeat due in 3 yrs  . Constipation   . Elevated ferritin   . Family history of breast cancer 08/2018   IBIS=18.7%  . History of goiter    thryoidectomy  .  Hyperlipidemia   . Hypothyroidism   . Internal hemorrhoids   . Intracranial hypertension 12/31/2011  . Migraine    without aura and menstrual migraine with aura; sees neuro  . Pre-eclampsia   . Thyroid disease    Thryoidectomy  . Vitamin D deficiency     Past Surgical History:  Procedure Laterality Date  . CESAREAN SECTION    . COLONOSCOPY  03/27/2016   polyp, internal hemorrhoid; repeat in 3 yrs.  Marland Kitchen HEMORRHOID BANDING    . POLYPECTOMY    . THYROIDECTOMY  2010   treat benign goiter    Family History  Problem Relation Age of Onset  . Obesity Mother   . Thyroid disease Mother   . Drug abuse Mother   . Heart disease Father   . COPD Father        smoker  . Lung cancer Father   . High blood pressure Father   . High Cholesterol Father   . Anxiety disorder Father   . Thyroid disease Maternal Grandmother   . Breast cancer Maternal Grandmother 56  . Lung cancer Paternal Uncle   . Colon cancer Paternal Uncle   . Esophageal cancer Neg Hx   . Migraines Neg Hx   . Ovarian cancer Neg Hx   . Colon polyps Neg Hx     Social History   Socioeconomic History  . Marital status: Married    Spouse name: Darriana Deboy  .  Number of children: 1  . Years of education: 46  . Highest education level: Master's degree (e.g., MA, MS, MEng, MEd, MSW, MBA)  Occupational History  . Occupation: Pharmacologist   . Occupation: Public librarian [employer]    Comment: OCD and related disorders  . Occupation: banking  Tobacco Use  . Smoking status: Never    Passive exposure: Never  . Smokeless tobacco: Never  Vaping Use  . Vaping Use: Never used  Substance and Sexual Activity  . Alcohol use: Not Currently    Alcohol/week: 0.0 standard drinks of alcohol  . Drug use: Never  . Sexual activity: Yes    Partners: Male    Birth control/protection: I.U.D.  Other Topics Concern  . Not on file  Social History Narrative   [name]   41 years old-2023   Social Determinants of  Health   Financial Resource Strain: Not on file  Food Insecurity: Not on file  Transportation Needs: Not on file  Physical Activity: Not on file  Stress: Not on file  Social Connections: Not on file  Intimate Partner Violence: Not on file    Outpatient Medications Prior to Visit  Medication Sig Dispense Refill  . albuterol (VENTOLIN HFA) 108 (90 Base) MCG/ACT inhaler TAKE 2 PUFFS BY MOUTH EVERY 4-6 HOURS AS NEEDED FOR WHEEZE OR SHORTNESS OF BREATH 18 g 1  . cetirizine (ZYRTEC) 10 MG tablet Take 10 mg by mouth daily.    Marland Kitchen EPINEPHrine 0.3 mg/0.3 mL IJ SOAJ injection Inject 0.3 mg into the muscle as needed for anaphylaxis. 1 each 0  . fluticasone (FLOVENT HFA) 110 MCG/ACT inhaler Inhale 2 puffs twice a day with spacer to help prevent cough and wheeze. 1 each 3  . ibuprofen (ADVIL) 200 MG tablet Take 400 mg by mouth every 6 (six) hours as needed.    . Inulin (FIBER CHOICE PO) Take 5 capsules by mouth at bedtime.    . Melatonin 2.5 MG CAPS Take 1 capsule by mouth at bedtime.    . Multiple Vitamin (MULTIVITAMIN) tablet Take 1 tablet by mouth daily.    . Norethindrone-Ethinyl Estradiol-Fe Biphas (LO LOESTRIN FE) 1 MG-10 MCG / 10 MCG tablet TAKE 1 TABLET BY MOUTH DAILY. CONTINUOUS DOSING 84 tablet 1  . nortriptyline (PAMELOR) 10 MG capsule TAKE 1 CAPSULE(10 MG) BY MOUTH AT BEDTIME 90 capsule 1  . Vitamin D, Cholecalciferol, 25 MCG (1000 UT) CAPS Take 1 capsule by mouth daily.    . Vitamin D, Ergocalciferol, (DRISDOL) 1.25 MG (50000 UNIT) CAPS capsule Take 1 capsule (50,000 Units total) by mouth every 7 (seven) days. 5 capsule 0  . zolpidem (AMBIEN) 5 MG tablet Take 1 tablet (5 mg total) by mouth at bedtime. Take one po qhs prn insomnia 30 tablet 0  . levothyroxine (SYNTHROID) 150 MCG tablet Take one tablet po qd for six days a week, do not take on the seventh day (Patient taking differently: Take 150 mcg by mouth daily before breakfast. Take one tablet po qd for six days a week, do not take on the  seventh day) 88 tablet 1   No facility-administered medications prior to visit.    Allergies  Allergen Reactions  . Flonase [Fluticasone Propionate] Anaphylaxis    Has alcohol in nasal spray  . Hydrocodone-Acetaminophen Other (See Comments)    Suicidal   . Latex Anaphylaxis  . Rubbing Alcohol [Alcohol] Anaphylaxis  . Topamax [Topiramate] Other (See Comments)    HI/SI  . Bean Pod Extract Swelling  Butter Bean, Baked Bean: Facial Swelling  . Hydrocodone     Suicidal thoughts with medication  . Mucinex [Guaifenesin Er] Other (See Comments)    Lose hearing for 1-2 weeks.  Marland Kitchen Penicillins   . Wellbutrin [Bupropion] Other (See Comments)    Extreme insomnia  . Cephalexin Rash    Review of Systems     Objective:    Physical Exam Constitutional:      General: She is not in acute distress.    Appearance: Normal appearance. She is not ill-appearing or toxic-appearing.  Pulmonary:     Effort: Pulmonary effort is normal.  Neurological:     General: No focal deficit present.     Mental Status: She is alert and oriented to person, place, and time. Mental status is at baseline.  Psychiatric:        Mood and Affect: Mood normal.        Behavior: Behavior normal.        Thought Content: Thought content normal.        Judgment: Judgment normal.    There were no vitals taken for this visit. Wt Readings from Last 3 Encounters:  10/21/22 171 lb (77.6 kg)  10/07/22 174 lb 4 oz (79 kg)  10/07/22 169 lb 12.8 oz (77 kg)       Assessment & Plan:   Problem List Items Addressed This Visit       Endocrine   Hypothyroidism    Start synthroid 175 mcg once daily for five days, and take 150 mcg synthroid Saturday and Sunday. This will be about a dose of 167 daily. We will repeat tsh in four weeks.        Relevant Medications   levothyroxine (SYNTHROID) 175 MCG tablet   levothyroxine (SYNTHROID) 150 MCG tablet     Other   Mixed hyperlipidemia - Primary    Pt advised of the  following: Start red yeast rice and daily fish oils.  F/u in office in three months , and come fasting for repeat labs.  Work on [employer] and exercise as tolerated.       I have discontinued Nathania Waldman "On-Na"'s levothyroxine. I am also having her start on levothyroxine and levothyroxine. Additionally, I am having her maintain her Melatonin, Vitamin D (Cholecalciferol), multivitamin, albuterol, Flovent HFA, cetirizine, ibuprofen, Inulin (FIBER CHOICE PO), EPINEPHrine, Lo Loestrin Fe, nortriptyline, zolpidem, and Vitamin D (Ergocalciferol).  Meds ordered this encounter  Medications  . levothyroxine (SYNTHROID) 175 MCG tablet    Sig: Take 175 mcg once daily for five days, then take 150 mg (new prescription) Saturday and Sunday on the other two days of the week.    Dispense:  90 tablet    Refill:  0    Order Specific Question:   Supervising Provider    Answer:   BEDSOLE, AMY E [[id number]]  . levothyroxine (SYNTHROID) 150 MCG tablet    Sig: Take 175 mcg tablet five days a week, and take 150 mg tablet two days a week    Dispense:  90 tablet    Refill:  3    Order Specific Question:   Supervising Provider    Answer:   BEDSOLE, AMY E [[id number]]    I discussed the assessment and treatment plan with the patient. The patient was provided an opportunity to ask questions and all were answered. The patient agreed with the plan and demonstrated an understanding of the instructions.   The patient was advised to call back or  seek an in-person evaluation if the symptoms worsen or if the condition fails to improve as anticipated.  I provided 15 minutes of face-to-face time during this encounter.   Eugenia Pancoast, [facility] [phone] (phone) [phone] (fax)  [place]

## 2022-10-29 NOTE — Progress Notes (Signed)
Chief Complaint:   OBESITY Jacqueline Wilson is here to discuss her progress with her obesity treatment plan along with follow-up of her obesity related diagnoses. Jacqueline Wilson is on the Category 1 Plan and states she is following her eating plan approximately 20% of the time. Jacqueline Wilson states she is not exercising.   Today's visit was #: 8 Starting weight: 178 lbs Starting date: 06/05/2022 Today's weight: 171 lbs Today's date: 10/21/2022 Total lbs lost to date: 7 lbs Total lbs lost since last in-office visit: +2 lbs  Interim History: She has been feeling very tired with hypothyroidism, dose was recently increased.  Had had more constipation wit hardly any appetite.  She works 2 full time jobs with high stress levels.  Working out consistently but energy levels have been lacking.   Subjective:   1. Post-operative hypothyroidism Last TSH 17.29. On 10/07/22, thyroid medicine dose increased one week ago by PCP.  Total thyroidectomy 2010. Awaiting a visit with a new endocrinologist.   2. Other constipation Worsened with hypothyroidism.  She has been drinking more water and taking a fiber supplement.  It is causing a loss of appetite and fatigue.   3. Vitamin D deficiency She is currently taking prescription vitamin D 50,000 IU each week. She denies nausea, vomiting or muscle weakness.  It is helping with overall energy.  06/05/2022 Vitamin D level 35.4.   4. Other hyperlipidemia She has been trying red yeast and Omega 3 fish oil.  Last FLP 10/07/2022.  Total cholesterol 239, HDL 44, triglycerides 289, LDL 148, unsure of family history.   Assessment/Plan:   1. Post-operative hypothyroidism Take levothyroxine dose as prescribed by PCP.  2. Other constipation Increase water intake to >64 oz per day.  Begin Miralax OTC 17 g daily prn.   3. Vitamin D deficiency Refill - Vitamin D, Ergocalciferol, (DRISDOL) 1.25 MG (50000 UNIT) CAPS capsule; Take 1 capsule (50,000 Units total) by mouth every 7 (seven)  days.  Dispense: 5 capsule; Refill: 0  4. Other hyperlipidemia Follow up with PCP to recheck FLP, fear of statin noted.   5. Obesity, current BMI 30.3 Reviewed [employer] results, body fat % down to 31, muscle mass increased 2.6 lbs and decreased body fat 1.3 lbs.   Jacqueline Wilson is currently in the action stage of change. As such, her goal is to continue with weight loss efforts. She has agreed to keeping a food journal and adhering to recommended goals of 1400 calories and 80 protein daily.   Exercise goals:  As is .   Behavioral modification strategies: increasing lean protein intake, increasing vegetables, increasing water intake, increasing high fiber foods, decreasing eating out, no skipping meals, meal planning and cooking strategies, keeping healthy foods in the home, and decreasing junk food.  Jacqueline Wilson has agreed to follow-up with our clinic in 2 weeks. She was informed of the importance of frequent follow-up visits to maximize her success with intensive lifestyle modifications for her multiple health conditions.   Objective:   Blood pressure 106/75, pulse 70, temperature 98.4 F (36.9 C), height '5\' 3"'$  (1.6 m), weight 171 lb (77.6 kg), SpO2 99 %. Body mass index is 30.29 kg/m.  General: Cooperative, alert, well developed, in no acute distress. HEENT: Conjunctivae and lids unremarkable. Cardiovascular: Regular rhythm.  Lungs: Normal work of breathing. Neurologic: No focal deficits.   Lab Results  Component Value Date   CREATININE 0.93 06/05/2022   BUN 13 06/05/2022   NA 140 06/05/2022   K 4.8 06/05/2022  CL 101 06/05/2022   CO2 21 06/05/2022   Lab Results  Component Value Date   ALT 18 06/05/2022   AST 18 06/05/2022   ALKPHOS 76 06/05/2022   BILITOT 0.3 06/05/2022   Lab Results  Component Value Date   HGBA1C 4.9 06/05/2022   Lab Results  Component Value Date   INSULIN 8.6 06/05/2022   Lab Results  Component Value Date   TSH 17.29 (H) 10/07/2022   Lab Results   Component Value Date   CHOL 239 (H) 10/07/2022   HDL 44 (L) 10/07/2022   LDLCALC 148 (H) 10/07/2022   TRIG 289 (H) 10/07/2022   CHOLHDL 5.4 (H) 10/07/2022   Lab Results  Component Value Date   VD25OH 35.4 06/05/2022   VD25OH 43 09/17/2021   VD25OH 43 01/30/2021   Lab Results  Component Value Date   WBC 6.7 02/04/2022   HGB 13.6 02/04/2022   HCT 39.7 02/04/2022   MCV 89.8 02/04/2022   PLT 255 02/04/2022   Lab Results  Component Value Date   IRON 96 02/04/2022   TIBC 377 02/04/2022   FERRITIN 292 02/04/2022    Attestation Statements:   Reviewed by clinician on day of visit: allergies, medications, problem list, medical history, surgical history, family history, social history, and previous encounter notes.  I, Davy Pique, am acting as Location manager for Loyal Gambler, DO.  I have reviewed the above documentation for accuracy and completeness, and I agree with the above. Dell Ponto, DO

## 2022-11-04 ENCOUNTER — Ambulatory Visit (INDEPENDENT_AMBULATORY_CARE_PROVIDER_SITE_OTHER): Payer: No Typology Code available for payment source | Admitting: Family Medicine

## 2022-11-06 ENCOUNTER — Ambulatory Visit (INDEPENDENT_AMBULATORY_CARE_PROVIDER_SITE_OTHER): Payer: No Typology Code available for payment source | Admitting: Internal Medicine

## 2022-11-06 ENCOUNTER — Encounter: Payer: Self-pay | Admitting: Internal Medicine

## 2022-11-06 VITALS — BP 110/72 | HR 80 | Ht 63.0 in | Wt 176.4 lb

## 2022-11-06 DIAGNOSIS — E890 Postprocedural hypothyroidism: Secondary | ICD-10-CM

## 2022-11-06 DIAGNOSIS — E039 Hypothyroidism, unspecified: Secondary | ICD-10-CM

## 2022-11-06 NOTE — Progress Notes (Signed)
Patient ID: Jacqueline Wilson, female   DOB: 05/17/1981, 41 y.o.   MRN: [id number]  HPI  Jacqueline Wilson is a 41 y.o.-year-old female, returning for follow-up for postsurgical hypothyroidism.  She previously saw Dr. [name], last visit was 13 months ago (virtual).  Pt. describes that she has a significant family history of large goiters.  She had to have total thyroidectomy for enlarging thyroid at 41 y/o (at [facility]). She had multiple U/S since then >> no regrowth.  For postsurgical hypothyroidism, she has been on Levothyroxine 150 for years, then tests started to greatly fluctuate.  Her dose was increased to 175 mcg daily (suppressive dose) >> started to feel poorly: dizziness, presyncopal, increased hunger, muscle injuries >>  tried again 150 mcg daily, then 150 mcg 6/7 days >> but now on Levothyroxine 167 mcg daily (combination dose: 175 mcg 5/7 days, 150 mcg 2/7 days) -last change was 3 weeks ago.  She is felt poorly during the above dose changes, previously working out consistently - 2h a day (strength training), but then with significant fatigue and not able to exercise.  She also gained weight despite reduced calories.   She now takes Synthroid DAW (recently changed by PCP from generic levothyroxine): - in am - fasting now, but prev. Milk shake up to 6 mo ago - at least 30 min from b'fast - no calcium - no iron - + multivitamins at night - no PPIs - not on Biotin - + fiber supplements, Ergocalciferol, fish oil at night  I reviewed pt's thyroid tests: Lab Results  Component Value Date   TSH 17.29 (H) 10/07/2022   TSH 0.051 (L) 06/05/2022   TSH 0.26 (L) 04/08/2022   TSH 0.42 10/24/2021   TSH 0.07 (L) 09/17/2021   TSH 0.04 (L) 01/09/2021   TSH 2.57 07/31/2020   TSH 11.59 (H) 06/14/2020   TSH 2.45 09/30/2018   TSH 2.62 09/01/2018   FREET4 1.3 10/07/2022   FREET4 2.15 (H) 06/05/2022   FREET4 1.7 04/08/2022   FREET4 1.5 10/24/2021   FREET4 1.5 09/17/2021   FREET4 1.7 01/09/2021   FREET4  1.28 07/08/2016   T3FREE 3.5 06/05/2022   T3FREE 2.8 09/17/2021   T3FREE 2.7 01/09/2021   Antithyroid antibodies: No results found for: "THGAB" No components found for: "TPOAB"  Pt denies feeling nodules in neck, hoarseness, dysphagia/odynophagia.  She has + FH of thyroid disorders in: M and MGM - goiter, niece with Hashimoto's. No FH of thyroid cancer. + FH of Cushing's sd. In aunt. No h/o radiation tx to head or neck. No recent use of iodine supplements.  Pt. also has a history of overweight-sees weight management clinic. Of note, a dexamethasone suppression test was normal (cortisol 0.9) on 09/17/2021. She has colonic polyps - has colonoscopy q2 years. She also has internal hemorrhoids - banded. + FH of colon cancer.  ROS: + see HPI  Past Medical History:  Diagnosis Date   Allergy    BRCA negative 08/2018   MyRisk neg except PALB2 VUS   Colon polyp 02/2016   colonoscopy; repeat due in 3 yrs   Constipation    Elevated ferritin    Family history of breast cancer 08/2018   IBIS=18.7%   History of goiter    thryoidectomy   Hyperlipidemia    Hypothyroidism    Internal hemorrhoids    Intracranial hypertension 12/31/2011   Migraine    without aura and menstrual migraine with aura; sees neuro   Pre-eclampsia    Thyroid disease  Thryoidectomy   Vitamin D deficiency    Past Surgical History:  Procedure Laterality Date   CESAREAN SECTION     COLONOSCOPY  03/27/2016   polyp, internal hemorrhoid; repeat in 3 yrs.   HEMORRHOID BANDING     POLYPECTOMY     THYROIDECTOMY  2010   treat benign goiter   Social History   Socioeconomic History   Marital status: Married    Spouse name: Alaynah Schutter   Number of children: 1   Years of education: 18   Highest education level: Master's degree (e.g., MA, MS, MEng, MEd, MSW, MBA)  Occupational History   Occupation: Pharmacologist    Occupation: Public librarian [employer]    Comment: OCD and related disorders    Occupation: banking  Tobacco Use   Smoking status: Never    Passive exposure: Never   Smokeless tobacco: Never  Vaping Use   Vaping Use: Never used  Substance and Sexual Activity   Alcohol use: Not Currently    Alcohol/week: 0.0 standard drinks of alcohol   Drug use: Never   Sexual activity: Yes    Partners: Male    Birth control/protection: I.U.D.  Other Topics Concern   Not on file  Social History Narrative   [name]   41 years old-2023   Social Determinants of Health   Financial Resource Strain: Not on file  Food Insecurity: Not on file  Transportation Needs: Not on file  Physical Activity: Not on file  Stress: Not on file  Social Connections: Not on file  Intimate Partner Violence: Not on file   Current Outpatient Medications on File Prior to Visit  Medication Sig Dispense Refill   albuterol (VENTOLIN HFA) 108 (90 Base) MCG/ACT inhaler TAKE 2 PUFFS BY MOUTH EVERY 4-6 HOURS AS NEEDED FOR WHEEZE OR SHORTNESS OF BREATH 18 g 1   cetirizine (ZYRTEC) 10 MG tablet Take 10 mg by mouth daily.     EPINEPHrine 0.3 mg/0.3 mL IJ SOAJ injection Inject 0.3 mg into the muscle as needed for anaphylaxis. 1 each 0   fluticasone (FLOVENT HFA) 110 MCG/ACT inhaler Inhale 2 puffs twice a day with spacer to help prevent cough and wheeze. 1 each 3   ibuprofen (ADVIL) 200 MG tablet Take 400 mg by mouth every 6 (six) hours as needed.     Inulin (FIBER CHOICE PO) Take 5 capsules by mouth at bedtime.     levothyroxine (SYNTHROID) 150 MCG tablet Take 175 mcg tablet five days a week, and take 150 mg tablet two days a week 90 tablet 3   levothyroxine (SYNTHROID) 175 MCG tablet Take 175 mcg once daily for five days, then take 150 mg (new prescription) Saturday and Sunday on the other two days of the week. 90 tablet 0   Melatonin 2.5 MG CAPS Take 1 capsule by mouth at bedtime.     Multiple Vitamin (MULTIVITAMIN) tablet Take 1 tablet by mouth daily.     Norethindrone-Ethinyl Estradiol-Fe Biphas  (LO LOESTRIN FE) 1 MG-10 MCG / 10 MCG tablet TAKE 1 TABLET BY MOUTH DAILY. CONTINUOUS DOSING 84 tablet 1   nortriptyline (PAMELOR) 10 MG capsule TAKE 1 CAPSULE(10 MG) BY MOUTH AT BEDTIME 90 capsule 1   Vitamin D, Cholecalciferol, 25 MCG (1000 UT) CAPS Take 1 capsule by mouth daily.     Vitamin D, Ergocalciferol, (DRISDOL) 1.25 MG (50000 UNIT) CAPS capsule Take 1 capsule (50,000 Units total) by mouth every 7 (seven) days. 5 capsule 0   zolpidem (AMBIEN) 5 MG  tablet Take 1 tablet (5 mg total) by mouth at bedtime. Take one po qhs prn insomnia 30 tablet 0   No current facility-administered medications on file prior to visit.   Allergies  Allergen Reactions   Flonase [Fluticasone Propionate] Anaphylaxis    Has alcohol in nasal spray   Hydrocodone-Acetaminophen Other (See Comments)    Suicidal    Latex Anaphylaxis   Rubbing Alcohol [Alcohol] Anaphylaxis   Topamax [Topiramate] Other (See Comments)    HI/SI   Bean Pod Extract Swelling    Butter Bean, Baked Bean: Facial Swelling   Hydrocodone     Suicidal thoughts with medication   Mucinex [Guaifenesin Er] Other (See Comments)    Lose hearing for 1-2 weeks.   Penicillins    Wellbutrin [Bupropion] Other (See Comments)    Extreme insomnia   Cephalexin Rash   Family History  Problem Relation Age of Onset   Obesity Mother    Thyroid disease Mother    Drug abuse Mother    Heart disease Father    COPD Father        smoker   Lung cancer Father    High blood pressure Father    High Cholesterol Father    Anxiety disorder Father    Thyroid disease Maternal Grandmother    Breast cancer Maternal Grandmother 18   Lung cancer Paternal Uncle    Colon cancer Paternal Uncle    Esophageal cancer Neg Hx    Migraines Neg Hx    Ovarian cancer Neg Hx    Colon polyps Neg Hx    PE: BP 110/72 (BP Location: Right Arm, Patient Position: Sitting, Cuff Size: Normal)   Pulse 80   Ht _0  (1.6 m)   Wt 176 lb 6.4 oz (80 kg)   SpO2 96%   BMI 31.25  kg/m  Wt Readings from Last 3 Encounters:  11/06/22 176 lb 6.4 oz (80 kg)  10/23/22 171 lb (77.6 kg)  10/21/22 171 lb (77.6 kg)   Constitutional: overweight, in NAD Eyes:  EOMI, no exophthalmos ENT: no neck masses, no cervical lymphadenopathy, thyroidectomy scar inconspicuous Cardiovascular: RRR, No MRG Respiratory: CTA B Musculoskeletal: no deformities Skin:no rashes Neurological: no tremor with outstretched hands  ASSESSMENT: 1.  Uncontrolled postsurgical hypothyroidism  PLAN:  1. Patient with long-standing, hypothyroidism, developed after her total thyroidectomy as a teenager (approximately 41 years old), with significant fluctuations of her TFTs in the last 2 years and with several levothyroxine dose changes.  She is currently on brand-name Synthroid therapy and her dose was changed by PCP approximately 3 weeks ago. - latest thyroid labs reviewed with pt. >> elevated at last check: Lab Results  Component Value Date   TSH 17.29 (H) 10/07/2022  - she continues on LT4 d.a.w. 175 mcg 5/7 days, 150 mcg 2/7 days - pt feels better on this dose, but not back to baseline - we discussed about taking the thyroid hormone every day, with water or sweet tea, 2 hours before breakfast, separated by >4 hours from acid reflux medications, calcium, iron, multivitamins. Pt. is taking it correctly.  However, she mentions that 40 years, up to 6 months ago, she was taking levothyroxine which a protein shake that contains meal, coconut milk.  We discussed that this greatly affects the absorption of the levothyroxine.  This could have been the source of fluctuation of her TFTs and the need to increase levothyroxine dose in the past.  She mentions that she keeps her levothyroxine in a pillbox,  but we discussed about possibly putting it on the nightstand because it is mandatory to take it every day.  She does not feel that she missed doses and if she happens to miss a dose she takes 2 doses later in the week. -  Since she just changed the dose of levothyroxine 3 weeks ago, plan to get another set of labs in 2 to 3 weeks from now - if her TFTs remained uncontrolled, we discussed about possibly crushing the levothyroxine dose for faster absorption but I am also tempted to suggest Tirosint (liquid levothyroxine) for optimal absorption.  For now, I advised her to continue taking the whole pill, but these are options after the next of labs. - I will see her back in 4 months, but sooner for labs.  Orders Placed This Encounter  Procedures   TSH   T4, free   T3, free   - Total time spent for the visit: 40 min, in precharting, reviewing Dr. Cordelia Pen last note, obtaining medical information from the chart and from the pt, reviewing her  previous labs, evaluations, and treatments, reviewing her symptoms, counseling her about her thyroid condition (please see the discussed topics above), and developing a plan to further treat it.  Philemon Kingdom, MD PhD [facility]

## 2022-11-06 NOTE — Patient Instructions (Addendum)
Please continue Synthroid 175 mcg 5/7 days, 150 mcg 2/7 days.  Take the thyroid hormone every day, with water, at least 30 minutes before breakfast, separated by at least 4 hours from: - acid reflux medications - calcium - iron - multivitamins  Please come back for labs in 2-3 weeks.  Please come back in 4 months for another visit.

## 2022-11-16 ENCOUNTER — Other Ambulatory Visit: Payer: Self-pay

## 2022-11-16 ENCOUNTER — Emergency Department
Admission: EM | Admit: 2022-11-16 | Discharge: 2022-11-16 | Disposition: A | Discharge status: Home / Self Care | Payer: No Typology Code available for payment source | Attending: Emergency Medicine | Admitting: Emergency Medicine

## 2022-11-16 ENCOUNTER — Emergency Department: Payer: No Typology Code available for payment source

## 2022-11-16 DIAGNOSIS — R930 Abnormal findings on diagnostic imaging of skull and head, not elsewhere classified: Secondary | ICD-10-CM | POA: Insufficient documentation

## 2022-11-16 DIAGNOSIS — R200 Anesthesia of skin: Secondary | ICD-10-CM | POA: Diagnosis not present

## 2022-11-16 DIAGNOSIS — R202 Paresthesia of skin: Secondary | ICD-10-CM | POA: Diagnosis not present

## 2022-11-16 DIAGNOSIS — R519 Headache, unspecified: Secondary | ICD-10-CM

## 2022-11-16 LAB — DIFFERENTIAL
Abs Immature Granulocytes: 0.03 10*3/uL (ref 0.00–0.07)
Basophils Absolute: 0.1 10*3/uL (ref 0.0–0.1)
Basophils Relative: 1 %
Eosinophils Absolute: 0.1 10*3/uL (ref 0.0–0.5)
Eosinophils Relative: 1 %
Immature Granulocytes: 0 %
Lymphocytes Relative: 30 %
Lymphs Abs: 3.1 10*3/uL (ref 0.7–4.0)
Monocytes Absolute: 0.5 10*3/uL (ref 0.1–1.0)
Monocytes Relative: 5 %
Neutro Abs: 6.6 10*3/uL (ref 1.7–7.7)
Neutrophils Relative %: 63 %

## 2022-11-16 LAB — CBG MONITORING, ED: Glucose-Capillary: 110 mg/dL — ABNORMAL HIGH (ref 70–99)

## 2022-11-16 LAB — CBC
HCT: 40.8 % (ref 36.0–46.0)
Hemoglobin: 14.3 g/dL (ref 12.0–15.0)
MCH: 31 pg (ref 26.0–34.0)
MCHC: 35 g/dL (ref 30.0–36.0)
MCV: 88.3 fL (ref 80.0–100.0)
Platelets: 292 10*3/uL (ref 150–400)
RBC: 4.62 MIL/uL (ref 3.87–5.11)
RDW: 11.9 % (ref 11.5–15.5)
WBC: 10.3 10*3/uL (ref 4.0–10.5)
nRBC: 0 % (ref 0.0–0.2)

## 2022-11-16 LAB — PROTIME-INR
INR: 0.9 (ref 0.8–1.2)
Prothrombin Time: 12.4 seconds (ref 11.4–15.2)

## 2022-11-16 LAB — COMPREHENSIVE METABOLIC PANEL
ALT: 17 U/L (ref 0–44)
AST: 18 U/L (ref 15–41)
Albumin: 4.2 g/dL (ref 3.5–5.0)
Alkaline Phosphatase: 64 U/L (ref 38–126)
Anion gap: 10 (ref 5–15)
BUN: 19 mg/dL (ref 6–20)
CO2: 25 mmol/L (ref 22–32)
Calcium: 8.9 mg/dL (ref 8.9–10.3)
Chloride: 102 mmol/L (ref 98–111)
Creatinine, Ser: 0.86 mg/dL (ref 0.44–1.00)
GFR, Estimated: >60 mL/min (ref >60)
Glucose, Bld: 112 mg/dL — ABNORMAL HIGH (ref 70–99)
Potassium: 3.7 mmol/L (ref 3.5–5.1)
Sodium: 137 mmol/L (ref 135–145)
Total Bilirubin: 0.6 mg/dL (ref 0.3–1.2)
Total Protein: 7.7 g/dL (ref 6.5–8.1)

## 2022-11-16 LAB — ETHANOL: Alcohol, Ethyl (B): <10 mg/dL (ref <10)

## 2022-11-16 LAB — APTT: aPTT: 27 seconds (ref 24–36)

## 2022-11-16 MED ORDER — DIPHENHYDRAMINE HCL 50 MG/ML IJ SOLN
12.5000 mg | Freq: Once | INTRAMUSCULAR | Status: AC
  Administered 2022-11-16: 12.5 mg via INTRAVENOUS
  Filled 2022-11-16: qty 1

## 2022-11-16 MED ORDER — IOHEXOL 350 MG/ML SOLN
75.0000 mL | Freq: Once | INTRAVENOUS | Status: AC | PRN
  Administered 2022-11-16: 75 mL via INTRAVENOUS

## 2022-11-16 MED ORDER — MAGNESIUM SULFATE IN D5W 1-5 GM/100ML-% IV SOLN
1.0000 g | Freq: Once | INTRAVENOUS | Status: AC
  Administered 2022-11-16: 1 g via INTRAVENOUS
  Filled 2022-11-16: qty 100

## 2022-11-16 MED ORDER — SODIUM CHLORIDE 0.9 % IV BOLUS
1000.0000 mL | Freq: Once | INTRAVENOUS | Status: AC
  Administered 2022-11-16: 1000 mL via INTRAVENOUS

## 2022-11-16 MED ORDER — PROCHLORPERAZINE EDISYLATE 10 MG/2ML IJ SOLN
10.0000 mg | Freq: Once | INTRAMUSCULAR | Status: AC
  Administered 2022-11-16: 10 mg via INTRAVENOUS
  Filled 2022-11-16: qty 2

## 2022-11-16 NOTE — ED Provider Notes (Signed)
[facility] Provider Note    Event Date/Time   First MD Initiated Contact with Patient 11/16/22 2046     (approximate)   History   Migraine   HPI  Ling Flesch is a 41 y.o. female who reports sudden onset of a severe headache something like her migraines but not exactly more on the right side than the rest of the head.  She had bilateral shoulder numbness and tingling.  She says she has been doing a lot of lifting today though.      Physical Exam   Triage Vital Signs: ED Triage Vitals  Enc Vitals Group     BP 11/16/22 2031 (!) 166/103     Pulse Rate 11/16/22 2031 (!) 105     Resp 11/16/22 2031 18     Temp 11/16/22 2031 98.5 F (36.9 C)     Temp Source 11/16/22 2031 Oral     SpO2 11/16/22 2031 97 %     Weight 11/16/22 2032 176 lb (79.8 kg)     Height 11/16/22 2032 '5\' 3"'$  (1.6 m)     Head Circumference --      Peak Flow --      Pain Score 11/16/22 2032 10     Pain Loc --      Pain Edu? --      Excl. in [place]? --     Most recent vital signs: Vitals:   11/16/22 2239 11/16/22 2300  BP: 118/85 (!) 125/90  Pulse: 80 84  Resp: 15 19  Temp:    SpO2: 99% 98%     General: Awake, alert looks uncomfortable Head normocephalic atraumatic Eyes pupils equal round reactive extraocular movements intact Mouth no erythema or exudate CV:  Good peripheral perfusion.  Heart regular rate and rhythm no audible murmurs Resp:  Normal effort.  Lungs are clear Abd:  No distention.  Soft and nontender Neuro exam cranial nerves II through XII are intact although visual fields were not checked cerebellar finger-nose rapid alternating movements and hands and heel-to-shin are all normal.  Motor strength is 5/5 throughout patient does not report any numbness   ED Results / Procedures / Treatments   Labs (all labs ordered are listed, but only abnormal results are displayed) Labs Reviewed  COMPREHENSIVE METABOLIC PANEL - Abnormal; Notable for the following  components:      Result Value   Glucose, Bld 112 (*)    All other components within normal limits  CBG MONITORING, ED - Abnormal; Notable for the following components:   Glucose-Capillary 110 (*)    All other components within normal limits  ETHANOL  PROTIME-INR  APTT  CBC  DIFFERENTIAL  URINE DRUG SCREEN, QUALITATIVE ([facility] ONLY)  URINALYSIS, ROUTINE W REFLEX MICROSCOPIC  POC URINE PREG, ED     EKG  EKG read and interpreted by me shows normal sinus rhythm rate of 98 normal axis no acute ST-T changes   RADIOLOGY CT of the head is read by radiology reviewed and interpreted by me as negative CT angio read by radiology reviewed by me is negative MRI of the brain read by radiology reviewed by me is negative   PROCEDURES:  Critical Care performed:   Procedures   MEDICATIONS ORDERED IN ED: Medications  sodium chloride 0.9 % bolus 1,000 mL (0 mLs Intravenous Stopped 11/16/22 2332)  prochlorperazine (COMPAZINE) injection 10 mg (10 mg Intravenous Given 11/16/22 2244)  magnesium sulfate IVPB 1 g 100 mL (0 g Intravenous Stopped 11/16/22 2332)  diphenhydrAMINE (BENADRYL) injection 12.5 mg (12.5 mg Intravenous Given 11/16/22 2244)  iohexol (OMNIPAQUE) 350 MG/ML injection 75 mL (75 mLs Intravenous Contrast Given 11/16/22 2209)     IMPRESSION / MDM / ASSESSMENT AND PLAN / ED COURSE  I reviewed the triage vital signs and the nursing notes. Discussed in detail with neurology and then I discussed the patient's findings with her self and her companion.  As the neurologist himself mentioned patient had a negative head CT within 6 hours of onset of the headache.  This is very unlikely that this is a hemorrhagic cause for the headache.  I discussed the risk factors of CT without LP versus LP in addition to CT with both of them.  They do not really want to do an LP.  CT angiogram and MRI were also negative.  There is no sign of any stroke or hemorrhage.  The headache gets better mostly by  itself and improves with Compazine and Benadryl without getting all the fluids she had ordered.  Patient wants to go home at the end of this I will let her as she is doing quite well.  Differential diagnosis includes, but is not limited to, migraine headache or other rapid onset headache.  Subarachnoid is of course a possibility.  With the brain fogginess and tingling stroke was considered.  Most likely diagnosis is just rapid onset of a severe headache.  Patient's presentation is most consistent with acute presentation with potential threat to life or bodily function.  The patient is on the cardiac monitor to evaluate for evidence of arrhythmia and/or significant heart rate changes.  None was seen.   FINAL CLINICAL IMPRESSION(S) / ED DIAGNOSES   Final diagnoses:  Bad headache     Rx / DC Orders   ED Discharge Orders     None        Note:  This document was prepared using Dragon voice recognition software and may include unintentional dictation errors.   Nena Polio, MD 11/16/22 [phone]

## 2022-11-16 NOTE — ED Notes (Signed)
Tele neuro RN interviewing pt.

## 2022-11-16 NOTE — Progress Notes (Signed)
Lin Givens Elert @ 2045 LKWT '@1630'$  per patient From CT @ 2051 Paged [facility] @ 2051 Dr. Marvel Plan on screen @ 2118 Mrs 0

## 2022-11-16 NOTE — Progress Notes (Signed)
Chaplain responded to Code stroke. Pt. was awake and alert with family at bedside. Pt was pleasant, but it was apparent she was in discomfort. Ch offered compassionate presence. Please contact [place] if requested.

## 2022-11-16 NOTE — ED Notes (Signed)
Activated Code Stroke w/ [facility]

## 2022-11-16 NOTE — ED Notes (Signed)
Arrived from ct scan.

## 2022-11-16 NOTE — Consult Note (Signed)
[facility] TeleNeurology Consult Services   Patient Name:   Jacqueline Wilson, Jacqueline Wilson Date of Birth:   1981/10/29 Identification Number:   MRN - [id number] Date of Service:   11/16/2022 20:51:36  Diagnosis:       R51.9 - Headache, unspecified       R20.2 - Paresthesia of skin  Impression:      41yo woman with history of ?IIH, prior pre-eclampsia 10 years ago presents with thunderclap headache and paresthesias. By the time I was asked to evaluate the patient she is >4.5 hours from LKW, and additionally while NCCT is negative within 6hrs of onset, thunderclap headache can be concerning for possible SAH. We discussed LP, patient and husband would like to hold off on LP and see how her headache responds to medications. Recommend CTA/CTV head/neck and MRI brain w/o for further evaluation. Can try IV migraine cocktail with benadryl, compazine or reglan, and mag sulfate.  Our recommendations are outlined below.  Recommendations:       CTA/CTV head/neck and MRI brain w/o, IV migraine cocktail  Sign Out:       Discussed with Emergency Department Provider    ------------------------------------------------------------------------------  Advanced Imaging: Advanced imaging has been ordered. Results pending.   Metrics: Last Known Well: 11/16/2022 16:30:00 [facility] Notification Time: 11/16/2022 20:51:36 Arrival Time: 11/16/2022 20:24:00 Stamp Time: 11/16/2022 20:51:36 Initial Response Time: 11/16/2022 21:01:01 Symptoms: thunderclap headache. Initial patient interaction: 11/16/2022 21:18:36 NIHSS Assessment Completed: 11/16/2022 21:28:49 Patient is not a candidate for Thrombolytic. Thrombolytic Medical Decision: 11/16/2022 21:28:49 Patient was not deemed candidate for Thrombolytic because of following reasons: Last Well Known Above 4.5 Hours.  CT head showed no acute hemorrhage or acute core infarct.  Primary Provider Notified of Diagnostic Impression and Management Plan on:  11/16/2022 21:44:42    ------------------------------------------------------------------------------  History of Present Illness: Patient is a 41 year old Female.  Patient was brought by private transportation with symptoms of thunderclap headache. 40yo woman with history of ?IIH, prior pre-eclampsia 10 years ago presents with thunderclap headache and paresthesias. She reports sudden onset of posterior severe headache around 430pm today while she was playing with her children outside. The headache reached maximal intensity within seconds and has persisted. She had associated dizziness and tingling between her shoulder blades, and some blurred vision which resolved. The headache has not improved and remained severe despite two doses of ibuprofen at home. She has never had a headache like this before and reports it is much more intense than her usual migraines. No recent changes in medications other than starting red yeast rice within the past month. She is not on any anti   Past Medical History:      Hypertension      Hyperlipidemia  Medications:  No Anticoagulant use  No Antiplatelet use Reviewed EMR for current medications  Allergies:  Reviewed  Social History: Drug Use: No  Family History:  There is no family history of premature cerebrovascular disease pertinent to this consultation  ROS : 14 Points Review of Systems was performed and was negative except mentioned in HPI.  Past Surgical History: There Is No Surgical History Contributory To Today's Visit    Examination: BP(136/86), Pulse(105), 1A: Level of Consciousness - Alert; keenly responsive + 0 1B: Ask Month and Age - Both Questions Right + 0 1C: Blink Eyes & Squeeze Hands - Performs Both Tasks + 0 2: Test Horizontal Extraocular Movements - Normal + 0 3: Test Visual Fields - No Visual Loss + 0 4: Test Facial Palsy (Use Grimace  if Obtunded) - Normal symmetry + 0 5A: Test Left Arm Motor Drift - No Drift for 10  Seconds + 0 5B: Test Right Arm Motor Drift - No Drift for 10 Seconds + 0 6A: Test Left Leg Motor Drift - No Drift for 5 Seconds + 0 6B: Test Right Leg Motor Drift - No Drift for 5 Seconds + 0 7: Test Limb Ataxia (FNF/Heel-Shin) - No Ataxia + 0 8: Test Sensation - Normal; No sensory loss + 0 9: Test Language/Aphasia - Normal; No aphasia + 0 10: Test Dysarthria - Normal + 0 11: Test Extinction/Inattention - No abnormality + 0  NIHSS Score: 0  NIHSS Free Text : grip strength is equal  Pre-Morbid Modified Rankin Scale: 0 Points = No symptoms at all  Spoke with : Dr. Cinda Quest  Patient/Family was informed the Neurology Consult would occur via TeleHealth consult by way of interactive audio and video telecommunications and consented to receiving care in this manner.   Patient is being evaluated for possible acute neurologic impairment and high probability of imminent or life-threatening deterioration. I spent total of 40 minutes providing care to this patient, including time for face to face visit via telemedicine, review of medical records, imaging studies and discussion of findings with providers, the patient and/or family.   Dr Annamary Carolin   [facility] For Inpatient follow-up with [facility] physician please call [facility] [phone]. This is not an outpatient service. Post hospital discharge, please contact hospital directly.

## 2022-11-16 NOTE — ED Notes (Signed)
Patient transported to CT 

## 2022-11-16 NOTE — ED Triage Notes (Signed)
Pt reports sudden onset migraine today with hx of same. Pt reports symptom onset 76 today with posterior pain with radiation to L side of face. Pt reports sensation of "brain fog" with difficulty "pulling together thoughts" and dizziness. Reports blurred vision in L eye as well. Pt is alert and oriented following commands. Reports bilateral tingling to shoulders but is worse on right.

## 2022-11-16 NOTE — Discharge Instructions (Signed)
The tests we did today were negative.  Please follow-up with your regular doctor.  If need be they can refer you to neurology.  Please do not hesitate to return if you have another severe headache with symptoms like you did today.

## 2022-11-18 ENCOUNTER — Encounter: Payer: Self-pay | Admitting: Family

## 2022-11-18 ENCOUNTER — Ambulatory Visit (INDEPENDENT_AMBULATORY_CARE_PROVIDER_SITE_OTHER): Payer: No Typology Code available for payment source | Admitting: Family Medicine

## 2022-11-18 NOTE — Telephone Encounter (Signed)
Scheduled patient for 11/19/22 for follow up. Advised patient we would call or send mychart message to her if Lawerance Bach has any other recommendations or advise before appointment.

## 2022-11-19 ENCOUNTER — Ambulatory Visit (INDEPENDENT_AMBULATORY_CARE_PROVIDER_SITE_OTHER): Payer: No Typology Code available for payment source | Admitting: Family

## 2022-11-19 VITALS — BP 116/70 | HR 87 | Temp 98.0°F | Resp 16 | Ht 63.0 in | Wt 179.1 lb

## 2022-11-19 DIAGNOSIS — E559 Vitamin D deficiency, unspecified: Secondary | ICD-10-CM

## 2022-11-19 DIAGNOSIS — H539 Unspecified visual disturbance: Secondary | ICD-10-CM | POA: Insufficient documentation

## 2022-11-19 DIAGNOSIS — E038 Other specified hypothyroidism: Secondary | ICD-10-CM

## 2022-11-19 DIAGNOSIS — G932 Benign intracranial hypertension: Secondary | ICD-10-CM

## 2022-11-19 DIAGNOSIS — R7989 Other specified abnormal findings of blood chemistry: Secondary | ICD-10-CM

## 2022-11-19 DIAGNOSIS — G43709 Chronic migraine without aura, not intractable, without status migrainosus: Secondary | ICD-10-CM

## 2022-11-19 DIAGNOSIS — R519 Headache, unspecified: Secondary | ICD-10-CM

## 2022-11-19 LAB — IBC + FERRITIN
Ferritin: 349.5 ng/mL — ABNORMAL HIGH (ref 10.0–291.0)
Iron: 86 ug/dL (ref 42–145)
Saturation Ratios: 21.9 % (ref 20.0–50.0)
TIBC: 392 ug/dL (ref 250.0–450.0)
Transferrin: 280 mg/dL (ref 212.0–360.0)

## 2022-11-19 LAB — VITAMIN D 25 HYDROXY (VIT D DEFICIENCY, FRACTURES): VITD: 73.89 ng/mL (ref 30.00–100.00)

## 2022-11-19 LAB — SEDIMENTATION RATE: Sed Rate: 14 mm/hr (ref 0–20)

## 2022-11-19 NOTE — Assessment & Plan Note (Addendum)
Ordering sed rate pending results.  Reviewed MRI, CT head and CTA, stable.  Referral placed to neurology to eval/treat Blood pressure controlled. Manage stressors and triggers as able.  May be candidate for acetazolamide

## 2022-11-19 NOTE — Patient Instructions (Addendum)
  Stop by the lab prior to leaving today. I will notify you of your results once received.   A referral was placed today for neurology.  Please let us know if you have not heard back within 2 weeks about the referral.  A referral was placed today for ophthalmology.  Please let us know if you have not heard back within 2 weeks about the referral.   Regards,   Steele Stracener FNP-C

## 2022-11-19 NOTE — Assessment & Plan Note (Signed)
D/w pt increasing nortriptyline however pt declines as past h/o s/e in past with increased dose of this (at 20 mg in the past)

## 2022-11-19 NOTE — Assessment & Plan Note (Signed)
Continue ongoing f/u management with endo

## 2022-11-19 NOTE — Progress Notes (Signed)
Established Patient Office Visit  Subjective:  Patient ID: Jacqueline Wilson, female    DOB: [birth year]  Age: 41 y.o. MRN: [id number]  CC:  Chief Complaint  Patient presents with   Headache   Follow-up    HPI Jacqueline Wilson is here today for follow up.   Pt went to [facility] 11/18 for sudden onset headache with bil shoulder numbness and tingling.   Blood pressure ok today, 116/70 NSR EKG no acute st t changes Ct head negative  Ct angio negative  Mri brain negative   Given magnesium, ivf and benadryl in hospital with prochlorperazine which helped reduce the headache.   Dx years ago with ICH. Typically headaches will start on right side for her, migraines she finds starts on left sides. Since the hospital, she states that her headache has transferred to her right side , states that even though written in the ER a son right side it was on the left side when she showed up to the ER which is typically her 'worse side' so headache has improved but still present. The pain is intermittent, progressively more intermittent as the days pass. Has had 2-3 short second spurts that have receded. She does state that she had left blurry vision on the day she went to the ER as well as some confusion which has since resolved. She states that in the past she tried to increase pamelor to 20 mg but was too sensitive and felt as if she was floating on a cloud so she went back down to ten and tolerating better now.   In the past she was last seen by neurologist Dr. Jaynee Eagles in 2019, was placed on nortriptyline which has managed her headaches in the past, and she was given imitrex but she did have a side effect to this with increased weakness.   Last eye exam over a year ago.   Needs refill on vitamin D.  Was seen on vitamin D RX dose indefinitely with [facility], [facility].   Hypothyroid, now seeing endo for her thyroid needs. They were pleased with current dosage, and stable. Currently taking 150  mcg once daily and skipping every Sunday.  Lab Results  Component Value Date   TSH 17.29 (H) 10/07/2022     Past Medical History:  Diagnosis Date   Allergy    BRCA negative 08/2018   MyRisk neg except PALB2 VUS   Colon polyp 02/2016   colonoscopy; repeat due in 3 yrs   Constipation    Elevated ferritin    Family history of breast cancer 08/2018   IBIS=18.7%   History of goiter    thryoidectomy   Hyperlipidemia    Hypothyroidism    Internal hemorrhoids    Intracranial hypertension 12/31/2011   Migraine    without aura and menstrual migraine with aura; sees neuro   Pre-eclampsia    Thyroid disease    Thryoidectomy   Vitamin D deficiency     Past Surgical History:  Procedure Laterality Date   CESAREAN SECTION     COLONOSCOPY  03/27/2016   polyp, internal hemorrhoid; repeat in 3 yrs.   HEMORRHOID BANDING     POLYPECTOMY     THYROIDECTOMY  2010   treat benign goiter    Family History  Problem Relation Age of Onset   Obesity Mother    Thyroid disease Mother    Drug abuse Mother    Heart disease Father    COPD Father  smoker   Lung cancer Father    High blood pressure Father    High Cholesterol Father    Anxiety disorder Father    Thyroid disease Maternal Grandmother    Breast cancer Maternal Grandmother 97   Lung cancer Paternal Uncle    Colon cancer Paternal Uncle    Esophageal cancer Neg Hx    Migraines Neg Hx    Ovarian cancer Neg Hx    Colon polyps Neg Hx     Social History   Socioeconomic History   Marital status: Married    Spouse name: Jacqueline Wilson   Number of children: 1   Years of education: 18   Highest education level: Master's degree (e.g., MA, MS, MEng, MEd, MSW, MBA)  Occupational History   Occupation: Pharmacologist    Occupation: Public librarian [employer]    Comment: OCD and related disorders   Occupation: banking  Tobacco Use   Smoking status: Never    Passive exposure: Never   Smokeless tobacco: Never   Vaping Use   Vaping Use: Never used  Substance and Sexual Activity   Alcohol use: Not Currently    Alcohol/week: 0.0 standard drinks of alcohol   Drug use: Never   Sexual activity: Yes    Partners: Male    Birth control/protection: I.U.D.  Other Topics Concern   Not on file  Social History Narrative   [name]   41 years old-2023   Social Determinants of Health   Financial Resource Strain: Not on file  Food Insecurity: Not on file  Transportation Needs: Not on file  Physical Activity: Not on file  Stress: Not on file  Social Connections: Not on file  Intimate Partner Violence: Not on file    Outpatient Medications Prior to Visit  Medication Sig Dispense Refill   albuterol (VENTOLIN HFA) 108 (90 Base) MCG/ACT inhaler TAKE 2 PUFFS BY MOUTH EVERY 4-6 HOURS AS NEEDED FOR WHEEZE OR SHORTNESS OF BREATH 18 g 1   cetirizine (ZYRTEC) 10 MG tablet Take 10 mg by mouth daily.     EPINEPHrine 0.3 mg/0.3 mL IJ SOAJ injection Inject 0.3 mg into the muscle as needed for anaphylaxis. 1 each 0   fluticasone (FLOVENT HFA) 110 MCG/ACT inhaler Inhale 2 puffs twice a day with spacer to help prevent cough and wheeze. 1 each 3   ibuprofen (ADVIL) 200 MG tablet Take 400 mg by mouth every 6 (six) hours as needed.     Inulin (FIBER CHOICE PO) Take 5 capsules by mouth at bedtime.     levothyroxine (SYNTHROID) 150 MCG tablet Take 175 mcg tablet five days a week, and take 150 mg tablet two days a week 90 tablet 3   levothyroxine (SYNTHROID) 175 MCG tablet Take 175 mcg once daily for five days, then take 150 mg (new prescription) Saturday and Sunday on the other two days of the week. 90 tablet 0   Melatonin 2.5 MG CAPS Take 1 capsule by mouth at bedtime.     Multiple Vitamin (MULTIVITAMIN) tablet Take 1 tablet by mouth daily.     Norethindrone-Ethinyl Estradiol-Fe Biphas (LO LOESTRIN FE) 1 MG-10 MCG / 10 MCG tablet TAKE 1 TABLET BY MOUTH DAILY. CONTINUOUS DOSING 84 tablet 1   nortriptyline (PAMELOR)  10 MG capsule TAKE 1 CAPSULE(10 MG) BY MOUTH AT BEDTIME 90 capsule 1   Vitamin D, Cholecalciferol, 25 MCG (1000 UT) CAPS Take 1 capsule by mouth daily.     Vitamin D, Ergocalciferol, (DRISDOL) 1.25 MG (50000 UNIT)  CAPS capsule Take 1 capsule (50,000 Units total) by mouth every 7 (seven) days. 5 capsule 0   zolpidem (AMBIEN) 5 MG tablet Take 1 tablet (5 mg total) by mouth at bedtime. Take one po qhs prn insomnia 30 tablet 0   No facility-administered medications prior to visit.    Allergies  Allergen Reactions   Flonase [Fluticasone Propionate] Anaphylaxis    Has alcohol in nasal spray   Hydrocodone-Acetaminophen Other (See Comments)    Suicidal    Latex Anaphylaxis   Rubbing Alcohol [Alcohol] Anaphylaxis   Topamax [Topiramate] Other (See Comments)    HI/SI   Bean Pod Extract Swelling    Butter Bean, Baked Bean: Facial Swelling   Hydrocodone     Suicidal thoughts with medication   Mucinex [Guaifenesin Er] Other (See Comments)    Lose hearing for 1-2 weeks.   Penicillins    Wellbutrin [Bupropion] Other (See Comments)    Extreme insomnia   Cephalexin Rash         Objective:    Physical Exam Constitutional:      General: She is not in acute distress.    Appearance: Normal appearance. She is well-developed. She is obese. She is not ill-appearing.  HENT:     Right Ear: Tympanic membrane normal.     Left Ear: Tympanic membrane normal.     Nose: Nose normal. No congestion or rhinorrhea.     Right Turbinates: Not enlarged or swollen.     Left Turbinates: Not enlarged or swollen.     Right Sinus: No maxillary sinus tenderness or frontal sinus tenderness.     Left Sinus: No maxillary sinus tenderness or frontal sinus tenderness.     Mouth/Throat:     Mouth: Mucous membranes are moist.     Pharynx: No pharyngeal swelling, oropharyngeal exudate or posterior oropharyngeal erythema.     Tonsils: No tonsillar exudate.  Eyes:     Extraocular Movements: Extraocular movements  intact.     Conjunctiva/sclera: Conjunctivae normal.     Pupils: Pupils are equal, round, and reactive to light.  Neck:     Thyroid: No thyroid mass.  Cardiovascular:     Rate and Rhythm: Normal rate and regular rhythm.  Pulmonary:     Effort: Pulmonary effort is normal.     Breath sounds: Normal breath sounds.  Lymphadenopathy:     Cervical:     Right cervical: No superficial cervical adenopathy.    Left cervical: No superficial cervical adenopathy.  Neurological:     Mental Status: She is alert and oriented to person, place, and time. Mental status is at baseline.     Cranial Nerves: Cranial nerves 2-12 are intact. No cranial nerve deficit or facial asymmetry.     Sensory: Sensation is intact.     Motor: Motor function is intact. No weakness.     Coordination: Coordination is intact.     Gait: Gait is intact.       BP 116/70   Pulse 87   Temp 98 F (36.7 C)   Resp 16   Ht _0  (1.6 m)   Wt 179 lb 2 oz (81.3 kg)   SpO2 98%   BMI 31.73 kg/m  Wt Readings from Last 3 Encounters:  11/19/22 179 lb 2 oz (81.3 kg)  11/16/22 176 lb (79.8 kg)  11/06/22 176 lb 6.4 oz (80 kg)     Health Maintenance Due  Topic Date Due   HIV Screening  Never done   Hepatitis  C Screening  Never done   COVID-19 Vaccine (4 - 2023-24 season) 08/30/2022   PAP SMEAR-Modifier  09/17/2022    There are no preventive care reminders to display for this patient.  Lab Results  Component Value Date   TSH 17.29 (H) 10/07/2022   Lab Results  Component Value Date   WBC 10.3 11/16/2022   HGB 14.3 11/16/2022   HCT 40.8 11/16/2022   MCV 88.3 11/16/2022   PLT 292 11/16/2022   Lab Results  Component Value Date   NA 137 11/16/2022   K 3.7 11/16/2022   CO2 25 11/16/2022   GLUCOSE 112 (H) 11/16/2022   BUN 19 11/16/2022   CREATININE 0.86 11/16/2022   BILITOT 0.6 11/16/2022   ALKPHOS 64 11/16/2022   AST 18 11/16/2022   ALT 17 11/16/2022   PROT 7.7 11/16/2022   ALBUMIN 4.2 11/16/2022    CALCIUM 8.9 11/16/2022   ANIONGAP 10 11/16/2022   EGFR 80 06/05/2022   Lab Results  Component Value Date   CHOL 239 (H) 10/07/2022   Lab Results  Component Value Date   HDL 44 (L) 10/07/2022   Lab Results  Component Value Date   LDLCALC 148 (H) 10/07/2022   Lab Results  Component Value Date   TRIG 289 (H) 10/07/2022   Lab Results  Component Value Date   CHOLHDL 5.4 (H) 10/07/2022   Lab Results  Component Value Date   HGBA1C 4.9 06/05/2022      Assessment & Plan:   Problem List Items Addressed This Visit       Cardiovascular and Mediastinum   Chronic migraine without aura    D/w pt increasing nortriptyline however pt declines as past h/o s/e in past with increased dose of this (at 20 mg in the past)       Relevant Orders   Sedimentation rate   Ambulatory referral to Neurology     Endocrine   Hypothyroidism    Continue ongoing f/u management with endo         Other   Vitamin D deficiency   Relevant Orders   VITAMIN D 25 Hydroxy (Vit-D Deficiency, Fractures)   Elevated intracranial pressure    Ordering sed rate pending results.  Reviewed MRI, CT head and CTA, stable.  Referral placed to neurology to eval/treat Blood pressure controlled. Manage stressors and triggers as able.  May be candidate for acetazolamide        Relevant Orders   Ambulatory referral to Neurology   Ambulatory referral to Ophthalmology   Change in vision    Referral placed for opthalmology for eval/treat/r/o papilledema As pt gets occipital migraines/eye pain with headaches and with h/o ICH  If return of intense headache and or sudden loss or change in vision call 911 immediately       Relevant Orders   Ambulatory referral to Neurology   Ambulatory referral to Ophthalmology   Elevated ferritin - Primary    Repeat ibc ferritin today pending results      Relevant Orders   IBC + Ferritin   Other Visit Diagnoses     Occipital headache       Relevant Orders    Ambulatory referral to Ophthalmology       No orders of the defined types were placed in this encounter.   Follow-up: Return in about 6 months (around 05/20/2023).    Eugenia Pancoast, FNP

## 2022-11-19 NOTE — Assessment & Plan Note (Signed)
Repeat ibc ferritin today pending results

## 2022-11-19 NOTE — Assessment & Plan Note (Addendum)
Referral placed for opthalmology for eval/treat/r/o papilledema As pt gets occipital migraines/eye pain with headaches and with h/o ICH  If return of intense headache and or sudden loss or change in vision call 911 immediately

## 2022-11-26 NOTE — Progress Notes (Unsigned)
[facility]    Provider:  Dr Jaynee Eagles Requesting Provider: Eugenia Pancoast, [place] Primary Care Provider:  Eugenia Pancoast, FNP  CC:  headache  HPI:  Jacqueline Wilson is a 41 y.o. female here as requested by Eugenia Pancoast, FNP for sudden onset headache and blurry vision.  She has a past medical history of chronic migraine, hypothyroidism, chronic low back pain with bilateral sciatica, insomnia, class I obesity, cervical paraspinal muscle spasm, constipation, iron and vitamin D deficiency, mixed hyperlipidemia, intracranial hypertension, depression, elevated ferritin.  I reviewed notes from the emergency room recently November 16, 2022 Dr. Corinna Capra, she presented for migraine, sudden onset of a severe headache more on the right side, bilateral shoulder numbness and tingling after doing a lot of lifting.  Blood pressure was elevated and so was her pulse rate at 166/103 and 105.  Patient had a negative head CT, she declined a lumbar puncture, CT angiogram and MRI were also negative, the headache improved mostly by itself and with Compazine and Benadryl and patient wanted to go home.  Patient has had migraines since puberty, during pregnancy the migraines stopped, but in the past she has had daily migraines, she can get an ice pick feeling, stabs behind the left eye, can last 1 to 2 minutes, and she can have a migraine afterwards, nausea vomiting, migraines are behind the left eye, aching, pulsating pounding throbbing, light and sound sensitivity, fluorescent lights make her migraines worse, she can wake with headaches, she does not snore, controlling her environment helps, she has not noticed any food triggers, the migraines can last all day, no autonomic features, she had side effects to topiramate. 2 years ago she started having thyroid problems and gaining weight, she is under control now, she has gained 40 pounds, migraines improved with weight control, in the last about 4-5 migraine days  a month but no other headaches, on Saturday they were playing and she had a bad headache, stagged a little, head started killing her, 2 ibuprofen, within an hour it didn;t get better. She was having trouble walking, vision in left eye blurry. No other focal neurologic deficits, associated symptoms, inciting events or modifiable factors.  Reviewed notes, labs and imaging from outside physicians, which showed:  From a thorough review of records, medications tried that can be used in migraine management include: Propranolol and beta-blockers contraindicated due to asthma, Flexeril, Decadron tablets, Benadryl, ibuprofen, meclizine, Mobic, Medrol Dosepak, Antivert, magnesium IV, nortriptyline, Zofran injections, Imitrex, Compazine injections, topiramate (side effects)  MRI brain 11/16/2022: Normal  CTA H&N: Normal 11/16/2022  Recent Results (from the past 2160 hour(s))  Lipid panel     Status: Abnormal   Collection Time: 10/07/22 10:48 AM  Result Value Ref Range   Cholesterol 239 (H) <200 mg/dL   HDL 44 (L) > OR = 50 mg/dL   Triglycerides 289 (H) <150 mg/dL    Comment: . If a non-fasting specimen was collected, consider repeat triglyceride testing on a fasting specimen if clinically indicated.  Yates Decamp et al. J. of Clin. Lipidol. 0973;5:329-924. Marland Kitchen    LDL Cholesterol (Calc) 148 (H) mg/dL (calc)    Comment: Reference range: <100 . Desirable range <100 mg/dL for primary prevention;   <70 mg/dL for patients with CHD or diabetic patients  with > or = 2 CHD risk factors. Marland Kitchen LDL-C is now calculated using the Martin-Hopkins  calculation, which is a validated novel method providing  better accuracy than the Friedewald equation in the  estimation of LDL-C.  Cresenciano Genre et al. Annamaria Helling. 6195;093(26): 2061-2068  (http://education.[facility].com/faq/FAQ164)    Total CHOL/HDL Ratio 5.4 (H) <5.0 (calc)   Non-HDL Cholesterol (Calc) 195 (H) <130 mg/dL (calc)    Comment: For patients with diabetes  plus 1 major ASCVD risk  factor, treating to a non-HDL-C goal of <100 mg/dL  (LDL-C of <70 mg/dL) is considered a therapeutic  option.   T4, free     Status: None   Collection Time: 10/07/22 10:48 AM  Result Value Ref Range   Free T4 1.3 0.8 - 1.8 ng/dL  TSH     Status: Abnormal   Collection Time: 10/07/22 10:48 AM  Result Value Ref Range   TSH 17.29 (H) mIU/L    Comment:           Reference Range .           > or = 20 Years  0.40-4.50 .                Pregnancy Ranges           First trimester    0.26-2.66           Second trimester   0.55-2.73           Third trimester    0.43-2.91   Ethanol     Status: None   Collection Time: 11/16/22  8:56 PM  Result Value Ref Range   Alcohol, Ethyl (B) <10 <10 mg/dL    Comment: (NOTE) Lowest detectable limit for serum alcohol is 10 mg/dL.  For medical purposes only. Performed at [facility], [address] [place], [place] [address]   Protime-INR     Status: None   Collection Time: 11/16/22  8:56 PM  Result Value Ref Range   Prothrombin Time 12.4 11.4 - 15.2 seconds   INR 0.9 0.8 - 1.2    Comment: (NOTE) INR goal varies based on device and disease states. Performed at [facility], [address] [place], [place] [address]   APTT     Status: None   Collection Time: 11/16/22  8:56 PM  Result Value Ref Range   aPTT 27 24 - 36 seconds    Comment: Performed at [facility], [address] [place], [place] [address]  CBC     Status: None   Collection Time: 11/16/22  8:56 PM  Result Value Ref Range   WBC 10.3 4.0 - 10.5 K/uL   RBC 4.62 3.87 - 5.11 MIL/uL   Hemoglobin 14.3 12.0 - 15.0 g/dL   HCT 40.8 36.0 - 46.0 %   MCV 88.3 80.0 - 100.0 fL   MCH 31.0 26.0 - 34.0 pg   MCHC 35.0 30.0 - 36.0 g/dL   RDW 11.9 11.5 - 15.5 %   Platelets 292 150 - 400 K/uL   nRBC 0.0 0.0 - 0.2 %    Comment: Performed at [facility], [address] [place], [place] [address]  Differential     Status:  None   Collection Time: 11/16/22  8:56 PM  Result Value Ref Range   Neutrophils Relative % 63 %   Neutro Abs 6.6 1.7 - 7.7 K/uL   Lymphocytes Relative 30 %   Lymphs Abs 3.1 0.7 - 4.0 K/uL   Monocytes Relative 5 %   Monocytes Absolute 0.5 0.1 - 1.0 K/uL   Eosinophils Relative 1 %   Eosinophils Absolute 0.1 0.0 - 0.5 K/uL   Basophils Relative 1 %   Basophils  Absolute 0.1 0.0 - 0.1 K/uL   Immature Granulocytes 0 %   Abs Immature Granulocytes 0.03 0.00 - 0.07 K/uL    Comment: Performed at [facility], [address] [place], [place] [address]  Comprehensive metabolic panel     Status: Abnormal   Collection Time: 11/16/22  8:56 PM  Result Value Ref Range   Sodium 137 135 - 145 mmol/L   Potassium 3.7 3.5 - 5.1 mmol/L   Chloride 102 98 - 111 mmol/L   CO2 25 22 - 32 mmol/L   Glucose, Bld 112 (H) 70 - 99 mg/dL    Comment: Glucose reference range applies only to samples taken after fasting for at least 8 hours.   BUN 19 6 - 20 mg/dL   Creatinine, Ser 0.86 0.44 - 1.00 mg/dL   Calcium 8.9 8.9 - 10.3 mg/dL   Total Protein 7.7 6.5 - 8.1 g/dL   Albumin 4.2 3.5 - 5.0 g/dL   AST 18 15 - 41 U/L   ALT 17 0 - 44 U/L   Alkaline Phosphatase 64 38 - 126 U/L   Total Bilirubin 0.6 0.3 - 1.2 mg/dL   GFR, Estimated >60 >60 mL/min    Comment: (NOTE) Calculated using the CKD-EPI Creatinine Equation (2021)    Anion gap 10 5 - 15    Comment: Performed at [facility], [address] [place], [place] [address]  CBG monitoring, ED     Status: Abnormal   Collection Time: 11/16/22  9:06 PM  Result Value Ref Range   Glucose-Capillary 110 (H) 70 - 99 mg/dL    Comment: Glucose reference range applies only to samples taken after fasting for at least 8 hours.  VITAMIN D 25 Hydroxy (Vit-D Deficiency, Fractures)     Status: None   Collection Time: 11/19/22 12:25 PM  Result Value Ref Range   VITD 73.89 30.00 - 100.00 ng/mL  IBC + Ferritin     Status: Abnormal   Collection Time: 11/19/22  12:25 PM  Result Value Ref Range   Iron 86 42 - 145 ug/dL   Transferrin 280.0 212.0 - 360.0 mg/dL   Saturation Ratios 21.9 20.0 - 50.0 %   Ferritin 349.5 (H) 10.0 - 291.0 ng/mL   TIBC 392.0 250.0 - 450.0 mcg/dL  Sedimentation rate     Status: None   Collection Time: 11/19/22 12:25 PM  Result Value Ref Range   Sed Rate 14 0 - 20 mm/hr     Review of Systems: Patient complains of symptoms per HPI as well as the following symptoms migraine. Pertinent negatives and positives per HPI. All others negative.   Social History   Socioeconomic History   Marital status: Married    Spouse name: Leliana Kontz   Number of children: 1   Years of education: 18   Highest education level: Master's degree (e.g., MA, MS, MEng, MEd, MSW, MBA)  Occupational History   Occupation: Pharmacologist    Occupation: Public librarian [employer]    Comment: OCD and related disorders   Occupation: banking  Tobacco Use   Smoking status: Never    Passive exposure: Never   Smokeless tobacco: Never  Vaping Use   Vaping Use: Never used  Substance and Sexual Activity   Alcohol use: Not Currently    Alcohol/week: 0.0 standard drinks of alcohol   Drug use: Never   Sexual activity: Yes    Partners: Male    Birth control/protection: I.U.D.  Other Topics Concern   Not  on file  Social History Narrative   [name]   41 years old-2023   Right Handed   4-5 Cups of Tea a Day   Social Determinants of Health   Financial Resource Strain: Not on file  Food Insecurity: Not on file  Transportation Needs: Not on file  Physical Activity: Not on file  Stress: Not on file  Social Connections: Not on file  Intimate Partner Violence: Not on file    Family History  Problem Relation Age of Onset   Obesity Mother    Thyroid disease Mother    Drug abuse Mother    Heart disease Father    COPD Father        smoker   Lung cancer Father    High blood pressure Father    High Cholesterol Father     Anxiety disorder Father    Thyroid disease Maternal Grandmother    Breast cancer Maternal Grandmother 67   Lung cancer Paternal Uncle    Colon cancer Paternal Uncle    Esophageal cancer Neg Hx    Migraines Neg Hx    Ovarian cancer Neg Hx    Colon polyps Neg Hx     Past Medical History:  Diagnosis Date   Allergy    BRCA negative 08/2018   MyRisk neg except PALB2 VUS   Colon polyp 02/2016   colonoscopy; repeat due in 3 yrs   Constipation    Elevated ferritin    Family history of breast cancer 08/2018   IBIS=18.7%   History of goiter    thryoidectomy   Hyperlipidemia    Hypothyroidism    Internal hemorrhoids    Intracranial hypertension 12/31/2011   Migraine    without aura and menstrual migraine with aura; sees neuro   Pre-eclampsia    Thyroid disease    Thryoidectomy   Vitamin D deficiency     Patient Active Problem List   Diagnosis Date Noted   Migraine without aura and without status migrainosus, not intractable 11/27/2022   Change in vision 11/19/2022   Elevated ferritin 11/19/2022   Post-operative hypothyroidism 10/21/2022   Other constipation 10/21/2022   Other hyperlipidemia 10/21/2022   Depression 09/23/2022   Elevated intracranial pressure 08/21/2022   Anaphylactic syndrome 04/08/2022   Mixed hyperlipidemia 10/19/2021   Iron deficiency 09/04/2021   Vitamin D deficiency 09/04/2021   Constipation 06/28/2021   Cervical paraspinal muscle spasm 06/08/2021   Chronic low back pain with bilateral sciatica 05/31/2021   Seasonal and perennial allergic rhinitis 12/29/2018   Chronic migraine without aura 04/16/2017   Hypothyroidism 03/06/2017   Class 1 obesity with serious comorbidity and body mass index (BMI) of 31.0 to 31.9 in adult 03/06/2017   Insomnia 09/06/2016   Intracranial hypertension 12/31/2011    Past Surgical History:  Procedure Laterality Date   CESAREAN SECTION     COLONOSCOPY  03/27/2016   polyp, internal hemorrhoid; repeat in 3 yrs.    HEMORRHOID BANDING     POLYPECTOMY     THYROIDECTOMY  2010   treat benign goiter    Current Outpatient Medications  Medication Sig Dispense Refill   albuterol (VENTOLIN HFA) 108 (90 Base) MCG/ACT inhaler TAKE 2 PUFFS BY MOUTH EVERY 4-6 HOURS AS NEEDED FOR WHEEZE OR SHORTNESS OF BREATH 18 g 1   cetirizine (ZYRTEC) 10 MG tablet Take 10 mg by mouth daily.     EPINEPHrine 0.3 mg/0.3 mL IJ SOAJ injection Inject 0.3 mg into the muscle as needed for anaphylaxis. 1 each  0   fluticasone (FLOVENT HFA) 110 MCG/ACT inhaler Inhale 2 puffs twice a day with spacer to help prevent cough and wheeze. 1 each 3   ibuprofen (ADVIL) 200 MG tablet Take 400 mg by mouth every 6 (six) hours as needed.     Inulin (FIBER CHOICE PO) Take 5 capsules by mouth at bedtime.     levothyroxine (SYNTHROID) 150 MCG tablet Take 175 mcg tablet five days a week, and take 150 mg tablet two days a week 90 tablet 3   levothyroxine (SYNTHROID) 175 MCG tablet Take 175 mcg once daily for five days, then take 150 mg (new prescription) Saturday and Sunday on the other two days of the week. 90 tablet 0   Melatonin 2.5 MG CAPS Take 1 capsule by mouth at bedtime.     Multiple Vitamin (MULTIVITAMIN) tablet Take 1 tablet by mouth daily.     Norethindrone-Ethinyl Estradiol-Fe Biphas (LO LOESTRIN FE) 1 MG-10 MCG / 10 MCG tablet TAKE 1 TABLET BY MOUTH DAILY. CONTINUOUS DOSING 84 tablet 1   nortriptyline (PAMELOR) 10 MG capsule TAKE 1 CAPSULE(10 MG) BY MOUTH AT BEDTIME 90 capsule 1   Rimegepant Sulfate (NURTEC) 75 MG TBDP Take 75 mg by mouth daily as needed. For migraines. Take as close to onset of migraine as possible. One daily maximum. 2 tablet 0   Ubrogepant (UBRELVY) 100 MG TABS Take 100 mg by mouth every 2 (two) hours as needed. Maximum 252m a day. 2 tablet 0   Vitamin D, Cholecalciferol, 25 MCG (1000 UT) CAPS Take 1 capsule by mouth daily.     Vitamin D, Ergocalciferol, (DRISDOL) 1.25 MG (50000 UNIT) CAPS capsule Take 1 capsule (50,000 Units  total) by mouth every 7 (seven) days. 5 capsule 0   zolpidem (AMBIEN) 5 MG tablet Take 1 tablet (5 mg total) by mouth at bedtime. Take one po qhs prn insomnia 30 tablet 0   No current facility-administered medications for this visit.    Allergies as of 11/27/2022 - Review Complete 11/27/2022  Allergen Reaction Noted   Flonase [fluticasone propionate] Anaphylaxis 01/27/2019   Hydrocodone-acetaminophen Other (See Comments) 09/20/2014   Latex Anaphylaxis 02/04/2012   Rubbing alcohol [alcohol] Anaphylaxis 02/04/2012   Topamax [topiramate] Other (See Comments) 03/05/2017   Bean pod extract Swelling 07/08/2016   Hydrocodone  02/04/2012   Mucinex [guaifenesin er] Other (See Comments) 02/19/2021   Penicillins  02/04/2012   Wellbutrin [bupropion] Other (See Comments) 10/07/2022   Cephalexin Rash 02/04/2012    Vitals: BP 111/69 (BP Location: Right Arm, Patient Position: Sitting, Cuff Size: Normal)   Pulse 85   Ht _0  (1.6 m)   Wt 183 lb (83 kg)   BMI 32.42 kg/m  Last Weight:  Wt Readings from Last 1 Encounters:  11/27/22 183 lb (83 kg)   Last Height:   Ht Readings from Last 1 Encounters:  11/27/22 _1  (1.6 m)     Physical exam: Exam: Gen: NAD, conversant, well nourised, obese, well groomed                     CV: RRR, no MRG. No Carotid Bruits. No peripheral edema, warm, nontender Eyes: Conjunctivae clear without exudates or hemorrhage  Neuro: Detailed Neurologic Exam  Speech:    Speech is normal; fluent and spontaneous with normal comprehension.  Cognition:    The patient is oriented to person, place, and time;     recent and remote memory intact;     language fluent;  normal attention, concentration,     fund of knowledge Cranial Nerves:    The pupils are equal, round, and reactive to light. The fundi are normal and spontaneous venous pulsations are present. Visual fields are full to finger confrontation. Extraocular movements are intact. Trigeminal sensation is  intact and the muscles of mastication are normal. The face is symmetric. The palate elevates in the midline. Hearing intact. Voice is normal. Shoulder shrug is normal. The tongue has normal motion without fasciculations.   Coordination:    Normal finger to nose and heel to shin. Normal rapid alternating movements.   Gait:    Heel-toe and tandem gait are normal.   Motor Observation:    No asymmetry, no atrophy, and no involuntary movements noted. Tone:    Normal muscle tone.    Posture:    Posture is normal. normal erect    Strength:    Strength is V/V in the upper and lower limbs.      Sensation: intact to LT     Reflex Exam:  DTR's:    Deep tendon reflexes in the upper and lower extremities are normal bilaterally.   Toes:    The toes are downgoing bilaterally.   Clonus:    Clonus is absent.    Assessment/Plan:  Patient with migraines without aura, usually 4-5 migraine days a month, they ca be severe but she manages them with ibuprofen, but recently seen in the ED with severe migraine, blurry vision left eye, MRI brain, CTA H&N normal. She was having word difficulty. Lasted 3-4 hours. Improved with migraine cocktail. Symptoms are completely resolved.  - recommend seeing an eye doctor (she already has appointment) - discussed prevention and acute management. She just wants acute management at this time.  - she is very concerned about side effects, can give her some nurtec/ubrelvy samples to try acutely and she can let us know if you want [place] to prescribe one or the other - patient declines follow up will call as needed - Ubrelvy: Please take one tablet at the onset of your headache. If it does not improve the symptoms please take one additional tablet 2 hours.  Nurtec: Take one at onsetof migraine. One maximum daily  If you like either please mychart [place] and we can prescribe  No orders of the defined types were placed in this encounter.  Meds ordered this encounter   Medications   Ubrogepant (UBRELVY) 100 MG TABS    Sig: Take 100 mg by mouth every 2 (two) hours as needed. Maximum 25m a day.    Dispense:  2 tablet    Refill:  0    [id number]   Rimegepant Sulfate (NURTEC) 75 MG TBDP    Sig: Take 75 mg by mouth daily as needed. For migraines. Take as close to onset of migraine as possible. One daily maximum.    Dispense:  2 tablet    Refill:  0    [id number]    Cc: DEugenia Pancoast FNP,  DEugenia Pancoast FNP  ASarina Ill MD  [facility] [address] [address] Phone [phone] [phone]

## 2022-11-27 ENCOUNTER — Ambulatory Visit: Payer: No Typology Code available for payment source | Admitting: Neurology

## 2022-11-27 ENCOUNTER — Encounter: Payer: Self-pay | Admitting: Neurology

## 2022-11-27 VITALS — BP 111/69 | HR 85 | Ht 63.0 in | Wt 183.0 lb

## 2022-11-27 DIAGNOSIS — G43009 Migraine without aura, not intractable, without status migrainosus: Secondary | ICD-10-CM | POA: Diagnosis not present

## 2022-11-27 MED ORDER — UBRELVY 100 MG PO TABS: 100.0000 mg | ORAL_TABLET | ORAL | 0 refills | Status: DC | PRN

## 2022-11-27 MED ORDER — NURTEC 75 MG PO TBDP: 75.0000 mg | ORAL_TABLET | Freq: Every day | ORAL | 0 refills | Status: DC | PRN

## 2022-11-27 NOTE — Patient Instructions (Addendum)
Jacqueline Wilson: Please take one tablet at the onset of your headache. If it does not improve the symptoms please take one additional tablet 2 hours.  Nurtec: Take one at onsetof migraine. One maximum daily  Can take them with ibuprofen,tylenol,no significant drug interactions  If you like either please mychart [place] and we can prescribe  Rimegepant Disintegrating Tablets What is this medication? RIMEGEPANT (ri ME je pant) prevents and treats migraines. It works by blocking a substance in the body that causes migraines. This medicine may be used for other purposes; ask your health care provider or pharmacist if you have questions. COMMON BRAND NAME(S): NURTEC ODT What should I tell my care team before I take this medication? They need to know if you have any of these conditions: Kidney disease Liver disease An unusual or allergic reaction to rimegepant, other medications, foods, dyes, or preservatives Pregnant or trying to get pregnant Breast-feeding How should I use this medication? Take this medication by mouth. Take it as directed on the prescription label. Leave the tablet in the sealed pack until you are ready to take it. With dry hands, open the pack and gently remove the tablet. If the tablet breaks or crumbles, throw it away. Use a new tablet. Place the tablet in the mouth and allow it to dissolve. Then, swallow it. Do not cut, crush, or chew this medication. You do not need water to take this medication. Talk to your care team about the use of this medication in children. Special care may be needed. Overdosage: If you think you have taken too much of this medicine contact a poison control center or emergency room at once. NOTE: This medicine is only for you. Do not share this medicine with others. What if I miss a dose? This does not apply. This medication is not for regular use. What may interact with this medication? Certain medications for fungal infections, such as fluconazole,  itraconazole Rifampin This list may not describe all possible interactions. Give your health care provider a list of all the medicines, herbs, non-prescription drugs, or dietary supplements you use. Also tell them if you smoke, drink alcohol, or use illegal drugs. Some items may interact with your medicine. What should I watch for while using this medication? Visit your care team for regular checks on your progress. Tell your care team if your symptoms do not start to get better or if they get worse. What side effects may I notice from receiving this medication? Side effects that you should report to your care team as soon as possible: Allergic reactions--skin rash, itching, hives, swelling of the face, lips, tongue, or throat Side effects that usually do not require medical attention (report to your care team if they continue or are bothersome): Nausea Stomach pain This list may not describe all possible side effects. Call your doctor for medical advice about side effects. You may report side effects to FDA at 1-800-FDA-1088. Where should I keep my medication? Keep out of the reach of children and pets. Store at room temperature between 20 and 25 degrees C (68 and 77 degrees F). Get rid of any unused medication after the expiration date. To get rid of medications that are no longer needed or have expired: Take the medication to a medication take-back program. Check with your pharmacy or law enforcement to find a location. If you cannot return the medication, check the label or package insert to see if the medication should be thrown out in the garbage or flushed  down the toilet. If you are not sure, ask your care team. If it is safe to put it in the trash, take the medication out of the container. Mix the medication with cat litter, dirt, coffee grounds, or other unwanted substance. Seal the mixture in a bag or container. Put it in the trash. NOTE: This sheet is a summary. It may not cover all  possible information. If you have questions about this medicine, talk to your doctor, pharmacist, or health care provider.  2023 Elsevier/Gold Standard (2022-01-03 00:00:00) Ubrogepant Tablets What is this medication? UBROGEPANT (ue BROE je pant) treats migraines. It works by blocking a substance in the body that causes migraines. It is not used to prevent migraines. This medicine may be used for other purposes; ask your health care provider or pharmacist if you have questions. COMMON BRAND NAME(S): Jacqueline Wilson What should I tell my care team before I take this medication? They need to know if you have any of these conditions: Kidney disease Liver disease An unusual or allergic reaction to ubrogepant, other medications, foods, dyes, or preservatives Pregnant or trying to get pregnant Breast-feeding How should I use this medication? Take this medication by mouth with a glass of water. Take it as directed on the prescription label. You can take it with or without food. If it upsets your stomach, take it with food. Keep taking it unless your care team tells you to stop. Talk to your care team about the use of this medication in children. Special care may be needed. Overdosage: If you think you have taken too much of this medicine contact a poison control center or emergency room at once. NOTE: This medicine is only for you. Do not share this medicine with others. What if I miss a dose? This does not apply. This medication is not for regular use. What may interact with this medication? Do not take this medication with any of the following: Adagrasib Ceritinib Certain antibiotics, such as chloramphenicol, clarithromycin, telithromycin Certain antivirals for HIV, such as atazanavir, cobicistat, darunavir, delavirdine, fosamprenavir, indinavir, ritonavir Certain medications for fungal infections, such as itraconazole, ketoconazole, posaconazole,  voriconazole Conivaptan Grapefruit Idelalisib Mifepristone Nefazodone Ribociclib This medication may also interact with the following: Carvedilol Certain medications for seizures, such as phenobarbital, phenytoin Ciprofloxacin Cyclosporine Eltrombopag Fluconazole Fluvoxamine Quinidine Rifampin St. John's wort Verapamil This list may not describe all possible interactions. Give your health care provider a list of all the medicines, herbs, non-prescription drugs, or dietary supplements you use. Also tell them if you smoke, drink alcohol, or use illegal drugs. Some items may interact with your medicine. What should I watch for while using this medication? Visit your care team for regular checks on your progress. Tell your care team if your symptoms do not start to get better or if they get worse. Your mouth may get dry. Chewing sugarless gum or sucking hard candy and drinking plenty of water may help. Contact your care team if the problem does not go away or is severe. What side effects may I notice from receiving this medication? Side effects that you should report to your care team as soon as possible: Allergic reactions--skin rash, itching, hives, swelling of the face, lips, tongue, or throat Side effects that usually do not require medical attention (report to your care team if they continue or are bothersome): Drowsiness Dry mouth Fatigue Nausea This list may not describe all possible side effects. Call your doctor for medical advice about side effects. You may report side  effects to FDA at 1-800-FDA-1088. Where should I keep my medication? Keep out of the reach of children and pets. Store between 15 and 30 degrees C (59 and 86 degrees F). Get rid of any unused medication after the expiration date. To get rid of medications that are no longer needed or have expired: Take the medication to a medication take-back program. Check with your pharmacy or law enforcement to find a  location. If you cannot return the medication, check the label or package insert to see if the medication should be thrown out in the garbage or flushed down the toilet. If you are not sure, ask your care team. If it is safe to put it in the trash, pour the medication out of the container. Mix the medication with cat litter, dirt, coffee grounds, or other unwanted substance. Seal the mixture in a bag or container. Put it in the trash. NOTE: This sheet is a summary. It may not cover all possible information. If you have questions about this medicine, talk to your doctor, pharmacist, or health care provider.  2023 Elsevier/Gold Standard (2022-02-06 00:00:00)

## 2022-11-29 ENCOUNTER — Other Ambulatory Visit (INDEPENDENT_AMBULATORY_CARE_PROVIDER_SITE_OTHER): Payer: No Typology Code available for payment source

## 2022-11-29 ENCOUNTER — Encounter: Payer: Self-pay | Admitting: Internal Medicine

## 2022-11-29 ENCOUNTER — Other Ambulatory Visit: Payer: No Typology Code available for payment source

## 2022-11-29 DIAGNOSIS — E038 Other specified hypothyroidism: Secondary | ICD-10-CM

## 2022-11-29 DIAGNOSIS — E890 Postprocedural hypothyroidism: Secondary | ICD-10-CM

## 2022-11-29 LAB — T4, FREE: Free T4: 1.29 ng/dL (ref 0.60–1.60)

## 2022-11-29 LAB — T3, FREE: T3, Free: 4.1 pg/mL (ref 2.3–4.2)

## 2022-11-29 LAB — TSH: TSH: 0.2 u[IU]/mL — ABNORMAL LOW (ref 0.35–5.50)

## 2022-11-29 MED ORDER — LEVOTHYROXINE SODIUM 150 MCG PO TABS: 150.0000 ug | ORAL_TABLET | Freq: Every day | ORAL | 3 refills | Status: DC

## 2022-12-02 ENCOUNTER — Telehealth: Payer: Self-pay

## 2022-12-02 DIAGNOSIS — G43009 Migraine without aura, not intractable, without status migrainosus: Secondary | ICD-10-CM

## 2022-12-02 NOTE — Telephone Encounter (Signed)
Sent mychart message to patient regarding prior authorization request for Nurtec.

## 2022-12-12 ENCOUNTER — Encounter: Payer: Self-pay | Admitting: Family

## 2022-12-12 DIAGNOSIS — R519 Headache, unspecified: Secondary | ICD-10-CM

## 2022-12-12 DIAGNOSIS — G932 Benign intracranial hypertension: Secondary | ICD-10-CM

## 2022-12-26 MED ORDER — NURTEC 75 MG PO TBDP: 75.0000 mg | ORAL_TABLET | Freq: Every day | ORAL | 0 refills | Status: DC | PRN

## 2022-12-26 NOTE — Addendum Note (Signed)
Addended by: Gertie Baron D on: 12/26/2022 04:45 PM   Modules accepted: Orders

## 2022-12-26 NOTE — Telephone Encounter (Signed)
PA for Nurtec submitted via CMM Key: [id number]. Reordered Rx as it was sent as sample.  Your information has been submitted to [place]. If [facility] has not responded to your request within 24 hours, contact [place] at [phone]

## 2022-12-31 NOTE — Telephone Encounter (Signed)
Received fax from [place]. Nurtec has been approved from 12/26/22 - 12/26/23.   PA # [id number]

## 2023-01-01 ENCOUNTER — Ambulatory Visit: Payer: No Typology Code available for payment source | Admitting: Internal Medicine

## 2023-03-07 ENCOUNTER — Ambulatory Visit (INDEPENDENT_AMBULATORY_CARE_PROVIDER_SITE_OTHER): Payer: No Typology Code available for payment source | Admitting: Internal Medicine

## 2023-03-07 ENCOUNTER — Encounter: Payer: Self-pay | Admitting: Internal Medicine

## 2023-03-07 VITALS — BP 108/70 | HR 76 | Ht 63.0 in | Wt 183.8 lb

## 2023-03-07 DIAGNOSIS — E890 Postprocedural hypothyroidism: Secondary | ICD-10-CM | POA: Diagnosis not present

## 2023-03-07 DIAGNOSIS — E038 Other specified hypothyroidism: Secondary | ICD-10-CM

## 2023-03-07 LAB — TSH: TSH: 0.49 u[IU]/mL (ref 0.35–5.50)

## 2023-03-07 LAB — T4, FREE: Free T4: 1.22 ng/dL (ref 0.60–1.60)

## 2023-03-07 MED ORDER — LEVOTHYROXINE SODIUM 150 MCG PO TABS: 150.0000 ug | ORAL_TABLET | Freq: Every day | ORAL | 1 refills | Status: DC

## 2023-03-07 NOTE — Patient Instructions (Signed)
Please continue Synthroid 150 mcg daily  Take the thyroid hormone every day, with water, at least 30 minutes before breakfast, separated by at least 4 hours from: - acid reflux medications - calcium - iron - multivitamins  Please stop at the lab.  Please come back in 6 months for another visit.

## 2023-03-07 NOTE — Progress Notes (Signed)
Patient ID: Jacqueline Wilson, female   DOB: 09/05/1981, 42 y.o.   MRN: [id number]  HPI  Jacqueline Wilson is a 42 y.o.-year-old female, returning for follow-up for postsurgical hypothyroidism.  She previously saw Dr. [name], but last visit with me 4 months ago.  Interim history: She feels better after decreasing the dose of LT4 in 11/2022: less fatigue, sleeping better, appetite controlled, less HAs.  Reviewed and addended history: Pt. has a significant family history of large goiters.  She had to have total thyroidectomy for enlarging thyroid at 42 y/o (at [facility]). She had multiple U/S since then >> no regrowth.  For postsurgical hypothyroidism, she has been on Levothyroxine 150 mcg daily for years, then tests started to greatly fluctuate.  Her dose was increased to 175 mcg daily (suppressive dose) >> started to feel poorly: dizziness, presyncopal, increased hunger, muscle injuries >>  tried again 150 mcg daily, then 150 mcg 6/7 days >> but then on Levothyroxine 167 mcg daily (combination dose: 175 mcg 5/7 days, 150 mcg 2/7 days).  She is felt poorly during the above dose changes, previously working out consistently - 2h a day (strength training), but then with significant fatigue and not able to exercise.  She also gained weight despite reduced calories.   We decreased the dose to 150 mcg daily in 11/2022.  She takes Synthroid d.a.w.: - in am - fasting  - at least 4h from b'fast - no calcium - no iron - + multivitamins at night - no PPIs - not on Biotin - + fiber supplements, Ergocalciferol,at night  I reviewed pt's thyroid tests: Lab Results  Component Value Date   TSH 0.20 (L) 11/29/2022   TSH 17.29 (H) 10/07/2022   TSH 0.051 (L) 06/05/2022   TSH 0.26 (L) 04/08/2022   TSH 0.42 10/24/2021   TSH 0.07 (L) 09/17/2021   TSH 0.04 (L) 01/09/2021   TSH 2.57 07/31/2020   TSH 11.59 (H) 06/14/2020   TSH 2.45 09/30/2018   FREET4 1.29 11/29/2022   FREET4 1.3 10/07/2022   FREET4 2.15 (H)  06/05/2022   FREET4 1.7 04/08/2022   FREET4 1.5 10/24/2021   FREET4 1.5 09/17/2021   FREET4 1.7 01/09/2021   FREET4 1.28 07/08/2016   T3FREE 4.1 11/29/2022   T3FREE 3.5 06/05/2022   T3FREE 2.8 09/17/2021   T3FREE 2.7 01/09/2021   Antithyroid antibodies: No results found for: "THGAB" No components found for: "TPOAB"  Pt denies feeling nodules in neck, hoarseness, dysphagia/odynophagia.  She has + FH of thyroid disorders in: M and MGM - goiter, niece with Hashimoto's. No FH of thyroid cancer. + FH of Cushing's sd. In aunt. No h/o radiation tx to head or neck. No recent use of iodine supplements.  Pt. also has a history of overweight - previously seeing weight management clinic. Of note, a dexamethasone suppression test was normal (cortisol 0.9) on 09/17/2021. She has colonic polyps - has colonoscopy q2 years. She also has internal hemorrhoids - banded. + FH of colon cancer.  ROS: + see HPI  Past Medical History:  Diagnosis Date   Allergy    BRCA negative 08/2018   MyRisk neg except PALB2 VUS   Colon polyp 02/2016   colonoscopy; repeat due in 3 yrs   Constipation    Elevated ferritin    Family history of breast cancer 08/2018   IBIS=18.7%   History of goiter    thryoidectomy   Hyperlipidemia    Hypothyroidism    Internal hemorrhoids    Intracranial hypertension 12/31/2011  Migraine    without aura and menstrual migraine with aura; sees neuro   Pre-eclampsia    Thyroid disease    Thryoidectomy   Vitamin D deficiency    Past Surgical History:  Procedure Laterality Date   CESAREAN SECTION     COLONOSCOPY  03/27/2016   polyp, internal hemorrhoid; repeat in 3 yrs.   HEMORRHOID BANDING     POLYPECTOMY     THYROIDECTOMY  2010   treat benign goiter   Social History   Socioeconomic History   Marital status: Married    Spouse name: Lynett Rohrer   Number of children: 1   Years of education: 18   Highest education level: Master's degree (e.g., MA, MS,  MEng, MEd, MSW, MBA)  Occupational History   Occupation: Pharmacologist    Occupation: Public librarian [employer]    Comment: OCD and related disorders   Occupation: banking  Tobacco Use   Smoking status: Never    Passive exposure: Never   Smokeless tobacco: Never  Vaping Use   Vaping Use: Never used  Substance and Sexual Activity   Alcohol use: Not Currently    Alcohol/week: 0.0 standard drinks of alcohol   Drug use: Never   Sexual activity: Yes    Partners: Male    Birth control/protection: I.U.D.  Other Topics Concern   Not on file  Social History Narrative   [name]   42 years old-2023   Right Handed   4-5 Cups of Tea a Day   Social Determinants of Health   Financial Resource Strain: Not on file  Food Insecurity: Not on file  Transportation Needs: Not on file  Physical Activity: Not on file  Stress: Not on file  Social Connections: Not on file  Intimate Partner Violence: Not on file   Current Outpatient Medications on File Prior to Visit  Medication Sig Dispense Refill   albuterol (VENTOLIN HFA) 108 (90 Base) MCG/ACT inhaler TAKE 2 PUFFS BY MOUTH EVERY 4-6 HOURS AS NEEDED FOR WHEEZE OR SHORTNESS OF BREATH 18 g 1   cetirizine (ZYRTEC) 10 MG tablet Take 10 mg by mouth daily.     EPINEPHrine 0.3 mg/0.3 mL IJ SOAJ injection Inject 0.3 mg into the muscle as needed for anaphylaxis. 1 each 0   fluticasone (FLOVENT HFA) 110 MCG/ACT inhaler Inhale 2 puffs twice a day with spacer to help prevent cough and wheeze. 1 each 3   ibuprofen (ADVIL) 200 MG tablet Take 400 mg by mouth every 6 (six) hours as needed.     Inulin (FIBER CHOICE PO) Take 5 capsules by mouth at bedtime.     levothyroxine (SYNTHROID) 150 MCG tablet Take 1 tablet (150 mcg total) by mouth daily before breakfast. 45 tablet 3   Melatonin 2.5 MG CAPS Take 1 capsule by mouth at bedtime.     Multiple Vitamin (MULTIVITAMIN) tablet Take 1 tablet by mouth daily.     Norethindrone-Ethinyl Estradiol-Fe Biphas  (LO LOESTRIN FE) 1 MG-10 MCG / 10 MCG tablet TAKE 1 TABLET BY MOUTH DAILY. CONTINUOUS DOSING 84 tablet 1   nortriptyline (PAMELOR) 10 MG capsule TAKE 1 CAPSULE(10 MG) BY MOUTH AT BEDTIME 90 capsule 1   Rimegepant Sulfate (NURTEC) 75 MG TBDP Take 75 mg by mouth daily as needed. For migraines. Take as close to onset of migraine as possible. One daily maximum. 16 tablet 0   Ubrogepant (UBRELVY) 100 MG TABS Take 100 mg by mouth every 2 (two) hours as needed. Maximum '200mg'$  a day. 2  tablet 0   Vitamin D, Cholecalciferol, 25 MCG (1000 UT) CAPS Take 1 capsule by mouth daily.     Vitamin D, Ergocalciferol, (DRISDOL) 1.25 MG (50000 UNIT) CAPS capsule Take 1 capsule (50,000 Units total) by mouth every 7 (seven) days. 5 capsule 0   zolpidem (AMBIEN) 5 MG tablet Take 1 tablet (5 mg total) by mouth at bedtime. Take one po qhs prn insomnia 30 tablet 0   No current facility-administered medications on file prior to visit.   Allergies  Allergen Reactions   Flonase [Fluticasone Propionate] Anaphylaxis    Has alcohol in nasal spray   Hydrocodone-Acetaminophen Other (See Comments)    Suicidal    Latex Anaphylaxis   Rubbing Alcohol [Alcohol] Anaphylaxis   Topamax [Topiramate] Other (See Comments)    HI/SI   Bean Pod Extract Swelling    Butter Bean, Baked Bean: Facial Swelling   Hydrocodone     Suicidal thoughts with medication   Mucinex [Guaifenesin Er] Other (See Comments)    Lose hearing for 1-2 weeks.   Penicillins    Wellbutrin [Bupropion] Other (See Comments)    Extreme insomnia   Cephalexin Rash   Family History  Problem Relation Age of Onset   Obesity Mother    Thyroid disease Mother    Drug abuse Mother    Heart disease Father    COPD Father        smoker   Lung cancer Father    High blood pressure Father    High Cholesterol Father    Anxiety disorder Father    Thyroid disease Maternal Grandmother    Breast cancer Maternal Grandmother 35   Lung cancer Paternal Uncle    Colon cancer  Paternal Uncle    Esophageal cancer Neg Hx    Migraines Neg Hx    Ovarian cancer Neg Hx    Colon polyps Neg Hx    PE: BP 108/70 (BP Location: Right Arm, Patient Position: Sitting, Cuff Size: Normal)   Pulse 76   Ht '5\' 3"'$  (1.6 m)   Wt 183 lb 12.8 oz (83.4 kg)   SpO2 97%   BMI 32.56 kg/m  Wt Readings from Last 3 Encounters:  03/07/23 183 lb 12.8 oz (83.4 kg)  11/27/22 183 lb (83 kg)  11/19/22 179 lb 2 oz (81.3 kg)   Constitutional: overweight, in NAD Eyes:  EOMI, no exophthalmos ENT: no neck masses, no cervical lymphadenopathy, thyroidectomy scar inconspicuous Cardiovascular: RRR, No MRG Respiratory: CTA B Musculoskeletal: no deformities Skin:no rashes Neurological: no tremor with outstretched hands  ASSESSMENT: 1.  Uncontrolled postsurgical hypothyroidism  PLAN:  1. Patient with longstanding hypothyroidism, developed after total thyroidectomy as a teenager (~42 y/o), with significant fluctuations in her TFTs in the last 2 years and with several levothyroxine dose changes.  She is currently on brand-name Synthroid. - latest thyroid labs reviewed with pt. >> suppressed at last check: Lab Results  Component Value Date   TSH 0.20 (L) 11/29/2022  - she is on LT4 150 mcg daily, decreased after the above results returned - pt feels much better on this dose - we discussed about taking the thyroid hormone every day, with water, >30 minutes before breakfast, separated by >4 hours from acid reflux medications, calcium, iron, multivitamins. Pt. is taking it correctly.  At last visit, she mentioned that she was keeping her levothyroxine in the pillbox, and we discussed about possibly placing it on the nightstand and to take it first thing in the morning.  She did  not feel that she was missing doses but she was taking them later in the week, to make up for the missed doses.  At this visit, she is still occasionally forgetting a dose but takes 2 tablets the next day.  She was also previously  taking levothyroxine with a protein shake that contained coconut milk and we discussed about the fact that this greatly affects the absorption of levothyroxine.  I advised her to take it with water, separated by at least 30 minutes from the protein shake, if she restarted this. - will check thyroid tests today: TSH and fT4 - If labs are abnormal, she will need to return for repeat TFTs in 1.5 months -I will see her back in 6 months but possibly sooner for labs  Needs refills -  3 mo  Component     Latest Ref Rng 03/07/2023  TSH     0.35 - 5.50 uIU/mL 0.49   T4,Free(Direct)     0.60 - 1.60 ng/dL 1.22   Normal TFTs.  I will refill the same dose of LT4.  Philemon Kingdom, MD PhD [facility] Endocrin[facility]

## 2023-03-09 ENCOUNTER — Other Ambulatory Visit: Payer: Self-pay | Admitting: Family

## 2023-03-09 DIAGNOSIS — F5101 Primary insomnia: Secondary | ICD-10-CM

## 2023-03-10 ENCOUNTER — Encounter: Payer: Self-pay | Admitting: Family

## 2023-03-10 NOTE — Telephone Encounter (Signed)
Make sure pt makes f/u appt in May so that we can continue to refill ambien. I can only provide six months of ambien (30 days with refills) until pt is required by state law to be seen again as it is a controlled substance. Sending in refill for Ambien with one additional refill to last her until May

## 2023-03-10 NOTE — Telephone Encounter (Signed)
Left message to return call to our office.  

## 2023-03-12 ENCOUNTER — Other Ambulatory Visit: Payer: Self-pay | Admitting: Family

## 2023-03-12 DIAGNOSIS — G43839 Menstrual migraine, intractable, without status migrainosus: Secondary | ICD-10-CM

## 2023-03-12 DIAGNOSIS — Z3009 Encounter for other general counseling and advice on contraception: Secondary | ICD-10-CM

## 2023-03-14 NOTE — Telephone Encounter (Signed)
Patient called back and appt has been scheduled.

## 2023-03-14 NOTE — Telephone Encounter (Signed)
Left message to return call to our office.  

## 2023-04-04 ENCOUNTER — Other Ambulatory Visit: Payer: Self-pay

## 2023-04-04 ENCOUNTER — Encounter: Payer: Self-pay | Admitting: Family

## 2023-04-04 DIAGNOSIS — G43709 Chronic migraine without aura, not intractable, without status migrainosus: Secondary | ICD-10-CM

## 2023-04-04 NOTE — Telephone Encounter (Signed)
Refill for nortriptyline (PAMELOR) 10 MG capsule   LV- 11/19/22 NV- 05/01/23 LR- 10/07/22 ( 90 caps/ 1 refill)

## 2023-04-07 MED ORDER — NORTRIPTYLINE HCL 10 MG PO CAPS: ORAL_CAPSULE | ORAL | 1 refills | Status: DC

## 2023-05-01 ENCOUNTER — Ambulatory Visit: Payer: No Typology Code available for payment source | Admitting: Family

## 2023-05-01 VITALS — BP 120/74 | HR 70 | Temp 98.1°F | Ht 63.0 in | Wt 184.6 lb

## 2023-05-01 DIAGNOSIS — E782 Mixed hyperlipidemia: Secondary | ICD-10-CM

## 2023-05-01 DIAGNOSIS — R7989 Other specified abnormal findings of blood chemistry: Secondary | ICD-10-CM | POA: Diagnosis not present

## 2023-05-01 DIAGNOSIS — J3089 Other allergic rhinitis: Secondary | ICD-10-CM

## 2023-05-01 DIAGNOSIS — G43009 Migraine without aura, not intractable, without status migrainosus: Secondary | ICD-10-CM

## 2023-05-01 DIAGNOSIS — Z6831 Body mass index (BMI) 31.0-31.9, adult: Secondary | ICD-10-CM

## 2023-05-01 DIAGNOSIS — G932 Benign intracranial hypertension: Secondary | ICD-10-CM

## 2023-05-01 DIAGNOSIS — E669 Obesity, unspecified: Secondary | ICD-10-CM

## 2023-05-01 NOTE — Progress Notes (Signed)
Established Patient Office Visit  Subjective:      CC:  Chief Complaint  Patient presents with   Medical Management of Chronic Issues    HPI: Jacqueline Wilson is a 42 y.o. female presenting on 05/01/2023 for Medical Management of Chronic Issues .  Chronic migraine, has recently established with neurologist Dr. Lucia Gaskins.  ICH: referral also placed for neuro last visit, has since established.  Hypothyroid, still seeing endocrinology regularly.  Had started on ubrelvy and nurtec (which only helps slightly and only if she gets this directly) however states that [place] did not help her.  She has since started a night guard which she states has helped significantly.  Lab Results  Component Value Date   TSH 0.49 03/07/2023   Change in vision, had been referred to ophthalmologist to r/o papilledema. She states was seen and evaluation was unremarkable.   Elevated ferritin last visit,  Lab Results  Component Value Date   FERRITIN 349.5 (H) 11/19/2022     New complaints: Concerned with inability to lose weight. Yoga daily, lifting weights severe times a week, and eating regular meals that are prepared in advance and working on 1200-1400 calories a day.      Social history:  Relevant past medical, surgical, family and social history reviewed and updated as indicated. Interim medical history since our last visit reviewed.  Allergies and medications reviewed and updated.  DATA REVIEWED: CHART IN EPIC     ROS: Negative unless specifically indicated above in HPI.    Current Outpatient Medications:    albuterol (VENTOLIN HFA) 108 (90 Base) MCG/ACT inhaler, TAKE 2 PUFFS BY MOUTH EVERY 4-6 HOURS AS NEEDED FOR WHEEZE OR SHORTNESS OF BREATH, Disp: 18 g, Rfl: 1   cetirizine (ZYRTEC) 10 MG tablet, Take 10 mg by mouth daily., Disp: , Rfl:    EPINEPHrine 0.3 mg/0.3 mL IJ SOAJ injection, Inject 0.3 mg into the muscle as needed for anaphylaxis., Disp: 1 each, Rfl: 0   fluticasone (FLOVENT  HFA) 110 MCG/ACT inhaler, Inhale 2 puffs twice a day with spacer to help prevent cough and wheeze., Disp: 1 each, Rfl: 3   ibuprofen (ADVIL) 200 MG tablet, Take 400 mg by mouth every 6 (six) hours as needed., Disp: , Rfl:    Inulin (FIBER CHOICE PO), Take 5 capsules by mouth at bedtime., Disp: , Rfl:    levothyroxine (SYNTHROID) 150 MCG tablet, Take 1 tablet (150 mcg total) by mouth daily before breakfast., Disp: 90 tablet, Rfl: 1   Melatonin 2.5 MG CAPS, Take 1 capsule by mouth at bedtime., Disp: , Rfl:    Multiple Vitamin (MULTIVITAMIN) tablet, Take 1 tablet by mouth daily., Disp: , Rfl:    Norethindrone-Ethinyl Estradiol-Fe Biphas (LO LOESTRIN FE) 1 MG-10 MCG / 10 MCG tablet, TAKE 1 TABLET BY MOUTH DAILY, Disp: 84 tablet, Rfl: 1   nortriptyline (PAMELOR) 10 MG capsule, TAKE 1 CAPSULE(10 MG) BY MOUTH AT BEDTIME, Disp: 90 capsule, Rfl: 1   Rimegepant Sulfate (NURTEC) 75 MG TBDP, Take 75 mg by mouth daily as needed. For migraines. Take as close to onset of migraine as possible. One daily maximum., Disp: 16 tablet, Rfl: 0   Vitamin D, Cholecalciferol, 25 MCG (1000 UT) CAPS, Take 1 capsule by mouth daily., Disp: , Rfl:    zolpidem (AMBIEN) 5 MG tablet, TAKE 1 TABLET(5 MG) BY MOUTH EVERY NIGHT AT BEDTIME AS NEEDED FOR INSOMNIA, Disp: 30 tablet, Rfl: 0      Objective:    BP 120/74  Pulse 70   Temp 98.1 F (36.7 C) (Temporal)   Ht 5\' 3"  (1.6 m)   Wt 184 lb 9.6 oz (83.7 kg)   SpO2 98%   BMI 32.70 kg/m   Wt Readings from Last 3 Encounters:  05/01/23 184 lb 9.6 oz (83.7 kg)  03/07/23 183 lb 12.8 oz (83.4 kg)  11/27/22 183 lb (83 kg)    Physical Exam Constitutional:      General: She is not in acute distress.    Appearance: Normal appearance. She is obese. She is not ill-appearing, toxic-appearing or diaphoretic.  HENT:     Head: Normocephalic.  Cardiovascular:     Rate and Rhythm: Normal rate and regular rhythm.  Pulmonary:     Effort: Pulmonary effort is normal.  Musculoskeletal:         General: Normal range of motion.  Neurological:     General: No focal deficit present.     Mental Status: She is alert and oriented to person, place, and time. Mental status is at baseline.  Psychiatric:        Mood and Affect: Mood normal.        Behavior: Behavior normal.        Thought Content: Thought content normal.        Judgment: Judgment normal.            Assessment & Plan:  Mixed hyperlipidemia -     Lipid panel; Future  Elevated ferritin Assessment & Plan: Repeat ibc ferritin  Was likely suspected inflammatory   Orders: -     Iron, TIBC and Ferritin Panel; Future -     CBC; Future  Seasonal and perennial allergic rhinitis Assessment & Plan: stable   Migraine without aura and without status migrainosus, not intractable Assessment & Plan: Stable nurtec prn  Continue elavil  Neurologist prn    Elevated intracranial pressure Assessment & Plan: Blood pressure controlled will continue to monitor  Asymptomatic    Obesity (BMI 30-39.9)  Class 1 obesity with serious comorbidity and body mass index (BMI) of 31.0 to 31.9 in adult, unspecified obesity type Assessment & Plan: Continue regular exercise  Did d/w her trying antiinflammatory diet        Return in about 1 year (around 04/30/2024) for f/u CPE.  Mort Sawyers, MSN, APRN, FNP-C [facility]

## 2023-05-01 NOTE — Assessment & Plan Note (Signed)
Continue regular exercise  Did d/w her trying antiinflammatory diet

## 2023-05-01 NOTE — Assessment & Plan Note (Signed)
Blood pressure controlled will continue to monitor  Asymptomatic

## 2023-05-01 NOTE — Assessment & Plan Note (Signed)
Repeat ibc ferritin  Was likely suspected inflammatory

## 2023-05-01 NOTE — Patient Instructions (Signed)
   Regards,   Jashad Depaula FNP-C  

## 2023-05-01 NOTE — Assessment & Plan Note (Signed)
stable °

## 2023-05-01 NOTE — Assessment & Plan Note (Signed)
Stable nurtec prn  Continue elavil  Neurologist prn

## 2023-07-10 ENCOUNTER — Ambulatory Visit: Payer: No Typology Code available for payment source | Admitting: Family

## 2023-07-10 ENCOUNTER — Encounter: Payer: Self-pay | Admitting: Family

## 2023-07-10 VITALS — BP 116/70 | HR 90 | Temp 97.4°F | Ht 63.0 in | Wt 187.5 lb

## 2023-07-10 DIAGNOSIS — E739 Lactose intolerance, unspecified: Secondary | ICD-10-CM | POA: Diagnosis not present

## 2023-07-10 DIAGNOSIS — J00 Acute nasopharyngitis [common cold]: Secondary | ICD-10-CM | POA: Diagnosis not present

## 2023-07-10 DIAGNOSIS — G932 Benign intracranial hypertension: Secondary | ICD-10-CM

## 2023-07-10 DIAGNOSIS — R051 Acute cough: Secondary | ICD-10-CM

## 2023-07-10 LAB — POC COVID19 BINAXNOW: SARS Coronavirus 2 Ag: NEGATIVE

## 2023-07-10 LAB — POCT RAPID STREP A (OFFICE): Rapid Strep A Screen: NEGATIVE

## 2023-07-10 MED ORDER — AZITHROMYCIN 250 MG PO TABS: ORAL_TABLET | ORAL | 0 refills | Status: AC

## 2023-07-10 NOTE — Assessment & Plan Note (Signed)
D/w pt that she will continue to have symptoms with ingestion of dairy.  Advised on diet recommendations and changes needed. Likely will have r/o symptoms if she eliminates dairy.

## 2023-07-10 NOTE — Assessment & Plan Note (Signed)
Suspect headache from viral nature however if no improvement f/u with neurology

## 2023-07-10 NOTE — Assessment & Plan Note (Signed)
Suspected viral Did advise pt to only take zpack if symptoms >7-10 days without improvement.  Guaifenesin prn

## 2023-07-10 NOTE — Progress Notes (Signed)
Established Patient Office Visit  Subjective:      CC:  Chief Complaint  Patient presents with   Generalized Body Aches    C/o body aches, fatigue, HA and fever. Sxs started 07/03/23.     HPI: Jacqueline Wilson is a 42 y.o. female presenting on 07/10/2023 for Generalized Body Aches (C/o body aches, fatigue, HA and fever. Sxs started 07/03/23. ) . About one week ago with muscle aches and body aches. Nausea as well.  Fatigue has increased as well over the last 48 hours. Feels like 'has not slept in days'  Also with headache in the last week.   No sore throat.  No ear pain.  Slight cough, dry.   Also c/o GI symptoms with eating dairy. She does confirm lactose intolerance.  She takes beano without relief. She states she can not eliminate dairy from her diet as she orders meals that all have dairy in them.    Social history:  Relevant past medical, surgical, family and social history reviewed and updated as indicated. Interim medical history since our last visit reviewed.  Allergies and medications reviewed and updated.  DATA REVIEWED: CHART IN EPIC     ROS: Negative unless specifically indicated above in HPI.    Current Outpatient Medications:    albuterol (VENTOLIN HFA) 108 (90 Base) MCG/ACT inhaler, TAKE 2 PUFFS BY MOUTH EVERY 4-6 HOURS AS NEEDED FOR WHEEZE OR SHORTNESS OF BREATH, Disp: 18 g, Rfl: 1   azithromycin (ZITHROMAX) 250 MG tablet, Take 2 tablets on day 1, then 1 tablet daily on days 2 through 5, Disp: 6 tablet, Rfl: 0   cetirizine (ZYRTEC) 10 MG tablet, Take 10 mg by mouth daily., Disp: , Rfl:    EPINEPHrine 0.3 mg/0.3 mL IJ SOAJ injection, Inject 0.3 mg into the muscle as needed for anaphylaxis., Disp: 1 each, Rfl: 0   fluticasone (FLOVENT HFA) 110 MCG/ACT inhaler, Inhale 2 puffs twice a day with spacer to help prevent cough and wheeze., Disp: 1 each, Rfl: 3   ibuprofen (ADVIL) 200 MG tablet, Take 400 mg by mouth every 6 (six) hours as needed., Disp: , Rfl:     Inulin (FIBER CHOICE PO), Take 5 capsules by mouth at bedtime., Disp: , Rfl:    levothyroxine (SYNTHROID) 150 MCG tablet, Take 1 tablet (150 mcg total) by mouth daily before breakfast., Disp: 90 tablet, Rfl: 1   Melatonin 2.5 MG CAPS, Take 1 capsule by mouth at bedtime., Disp: , Rfl:    Multiple Vitamin (MULTIVITAMIN) tablet, Take 1 tablet by mouth daily., Disp: , Rfl:    Norethindrone-Ethinyl Estradiol-Fe Biphas (LO LOESTRIN FE) 1 MG-10 MCG / 10 MCG tablet, TAKE 1 TABLET BY MOUTH DAILY, Disp: 84 tablet, Rfl: 1   nortriptyline (PAMELOR) 10 MG capsule, TAKE 1 CAPSULE(10 MG) BY MOUTH AT BEDTIME, Disp: 90 capsule, Rfl: 1   Rimegepant Sulfate (NURTEC) 75 MG TBDP, Take 75 mg by mouth daily as needed. For migraines. Take as close to onset of migraine as possible. One daily maximum., Disp: 16 tablet, Rfl: 0   Vitamin D, Cholecalciferol, 25 MCG (1000 UT) CAPS, Take 1 capsule by mouth daily., Disp: , Rfl:    zolpidem (AMBIEN) 5 MG tablet, TAKE 1 TABLET(5 MG) BY MOUTH EVERY NIGHT AT BEDTIME AS NEEDED FOR INSOMNIA, Disp: 30 tablet, Rfl: 0      Objective:    BP 116/70   Pulse 90   Temp (!) 97.4 F (36.3 C) (Temporal)   Ht 5\' 3"  (1.6 m)  Wt 187 lb 8 oz (85 kg)   SpO2 98%   BMI 33.21 kg/m   Wt Readings from Last 3 Encounters:  07/10/23 187 lb 8 oz (85 kg)  05/01/23 184 lb 9.6 oz (83.7 kg)  03/07/23 183 lb 12.8 oz (83.4 kg)    Physical Exam Constitutional:      General: She is not in acute distress.    Appearance: Normal appearance. She is normal weight. She is not ill-appearing, toxic-appearing or diaphoretic.  HENT:     Head: Normocephalic.     Right Ear: Tympanic membrane normal.     Left Ear: Tympanic membrane normal.     Nose: Nose normal.     Mouth/Throat:     Mouth: Mucous membranes are dry.     Pharynx: Posterior oropharyngeal erythema present. No oropharyngeal exudate.  Eyes:     Extraocular Movements: Extraocular movements intact.     Pupils: Pupils are equal, round, and  reactive to light.  Cardiovascular:     Rate and Rhythm: Normal rate and regular rhythm.     Pulses: Normal pulses.     Heart sounds: Normal heart sounds.  Pulmonary:     Effort: Pulmonary effort is normal.     Breath sounds: Normal breath sounds.  Musculoskeletal:     Cervical back: Normal range of motion.  Neurological:     General: No focal deficit present.     Mental Status: She is alert and oriented to person, place, and time. Mental status is at baseline.  Psychiatric:        Mood and Affect: Mood normal.        Behavior: Behavior normal.        Thought Content: Thought content normal.        Judgment: Judgment normal.           Assessment & Plan:  Acute cough -     POC COVID-19 BinaxNow -     POCT rapid strep A  Lactose intolerance Assessment & Plan: D/w pt that she will continue to have symptoms with ingestion of dairy.  Advised on diet recommendations and changes needed. Likely will have r/o symptoms if she eliminates dairy.     Acute nasopharyngitis Assessment & Plan: Suspected viral Did advise pt to only take zpack if symptoms >7-10 days without improvement.  Guaifenesin prn   Orders: -     Azithromycin; Take 2 tablets on day 1, then 1 tablet daily on days 2 through 5  Dispense: 6 tablet; Refill: 0  Intracranial hypertension Assessment & Plan: Suspect headache from viral nature however if no improvement f/u with neurology       Return if symptoms worsen or fail to improve.  Mort Sawyers, MSN, APRN, FNP-C [facility]

## 2023-08-21 ENCOUNTER — Other Ambulatory Visit: Payer: Self-pay

## 2023-08-21 DIAGNOSIS — Z3009 Encounter for other general counseling and advice on contraception: Secondary | ICD-10-CM

## 2023-08-21 DIAGNOSIS — G43839 Menstrual migraine, intractable, without status migrainosus: Secondary | ICD-10-CM

## 2023-08-21 NOTE — Telephone Encounter (Signed)
Received faxed refill request from [facility].  Medication pended.

## 2023-08-22 MED ORDER — LO LOESTRIN FE 1 MG-10 MCG / 10 MCG PO TABS: ORAL_TABLET | ORAL | 1 refills | Status: DC

## 2023-09-10 ENCOUNTER — Encounter: Payer: Self-pay | Admitting: Internal Medicine

## 2023-09-10 ENCOUNTER — Ambulatory Visit (INDEPENDENT_AMBULATORY_CARE_PROVIDER_SITE_OTHER): Payer: No Typology Code available for payment source | Admitting: Internal Medicine

## 2023-09-10 VITALS — BP 120/80 | HR 86 | Ht 63.0 in | Wt 186.2 lb

## 2023-09-10 DIAGNOSIS — Z23 Encounter for immunization: Secondary | ICD-10-CM | POA: Diagnosis not present

## 2023-09-10 DIAGNOSIS — E890 Postprocedural hypothyroidism: Secondary | ICD-10-CM

## 2023-09-10 DIAGNOSIS — E038 Other specified hypothyroidism: Secondary | ICD-10-CM

## 2023-09-10 LAB — T4, FREE: Free T4: 1.29 ng/dL (ref 0.60–1.60)

## 2023-09-10 LAB — TSH: TSH: 0.72 u[IU]/mL (ref 0.35–5.50)

## 2023-09-10 NOTE — Progress Notes (Signed)
Patient ID: Rockell Arbuthnot, female   DOB: February 21, 1981, 43 y.o.   MRN: [id number]  HPI  Sanju Senko is a 42 y.o.-year-old female, returning for follow-up for postsurgical hypothyroidism.  She previously saw Dr. Everardo All, but last visit with me 6 months ago.  Interim history: She tells me that she is sleeping better after she quit her second job and also after stopping Ambien.  She also feels that her stress is lower.  She is still frustrated about weight gain.   Reviewed and addended history: Pt. has a significant family history of large goiters.  She had to have total thyroidectomy for enlarging thyroid at 42 y/o (at [facility]). She had multiple U/S since then >> no regrowth.  For postsurgical hypothyroidism, she has been on Levothyroxine 150 mcg daily for years, then tests started to greatly fluctuate.  Her dose was increased to 175 mcg daily (suppressive dose) >> started to feel poorly: dizziness, presyncopal, increased hunger, muscle injuries >>  tried again 150 mcg daily, then 150 mcg 6/7 days >> but then on Levothyroxine 167 mcg daily (combination dose: 175 mcg 5/7 days, 150 mcg 2/7 days).  She has felt poorly during the above dose changes, previously working out consistently - 2h a day (strength training), but then with significant fatigue and not able to exercise.  She also gained weight despite reduced calories.   We decreased the dose to 150 mcg daily in 11/2022.  She felt  better after decreasing the dose of Synthroid in 11/2022: less fatigue, sleeping better, appetite controlled, less HAs.  She takes Levothyroxine (now generic): - in am - fasting  - 1h from b'fast - no calcium - no iron - + multivitamins at night - no PPIs - not on Biotin - + fiber supplements, vitamin D,at night  I reviewed pt's thyroid tests: Lab Results  Component Value Date   TSH 0.49 03/07/2023   TSH 0.20 (L) 11/29/2022   TSH 17.29 (H) 10/07/2022   TSH 0.051 (L) 06/05/2022   TSH 0.26 (L) 04/08/2022   TSH 0.42  10/24/2021   TSH 0.07 (L) 09/17/2021   TSH 0.04 (L) 01/09/2021   TSH 2.57 07/31/2020   TSH 11.59 (H) 06/14/2020   FREET4 1.22 03/07/2023   FREET4 1.29 11/29/2022   FREET4 1.3 10/07/2022   FREET4 2.15 (H) 06/05/2022   FREET4 1.7 04/08/2022   FREET4 1.5 10/24/2021   FREET4 1.5 09/17/2021   FREET4 1.7 01/09/2021   FREET4 1.28 07/08/2016   T3FREE 4.1 11/29/2022   T3FREE 3.5 06/05/2022   T3FREE 2.8 09/17/2021   T3FREE 2.7 01/09/2021   Antithyroid antibodies: No results found for: "THGAB" No components found for: "TPOAB"  Pt denies feeling nodules in neck, hoarseness, dysphagia/odynophagia.  She has + FH of thyroid disorders in: M and MGM - goiter, niece with Hashimoto's. No FH of thyroid cancer. + FH of Cushing's sd. In aunt. No h/o radiation tx to head or neck. No recent use of iodine supplements.  Pt. also has a history of overweight - previously seeing weight management clinic. Of note, a dexamethasone suppression test was normal (cortisol 0.9) on 09/17/2021. She has colonic polyps - has colonoscopy q2 years. She also has internal hemorrhoids - banded. + FH of colon cancer.  ROS: + see HPI  Past Medical History:  Diagnosis Date   Allergy    BRCA negative 08/2018   MyRisk neg except PALB2 VUS   Colon polyp 02/2016   colonoscopy; repeat due in 3 yrs   Constipation  Elevated ferritin    Family history of breast cancer 08/2018   IBIS=18.7%   History of goiter    thryoidectomy   Hyperlipidemia    Hypothyroidism    Internal hemorrhoids    Intracranial hypertension 12/31/2011   Migraine    without aura and menstrual migraine with aura; sees neuro   Pre-eclampsia    Thyroid disease    Thryoidectomy   Vitamin D deficiency    Past Surgical History:  Procedure Laterality Date   CESAREAN SECTION     COLONOSCOPY  03/27/2016   polyp, internal hemorrhoid; repeat in 3 yrs.   HEMORRHOID BANDING     POLYPECTOMY     THYROIDECTOMY  2010   treat benign goiter   Social  History   Socioeconomic History   Marital status: Married    Spouse name: Sharmonique Milman   Number of children: 1   Years of education: 18   Highest education level: Master's degree (e.g., MA, MS, MEng, MEd, MSW, MBA)  Occupational History   Occupation: Chief Financial Officer    Occupation: Systems analyst [employer]    Comment: OCD and related disorders   Occupation: banking  Tobacco Use   Smoking status: Never    Passive exposure: Never   Smokeless tobacco: Never  Vaping Use   Vaping status: Never Used  Substance and Sexual Activity   Alcohol use: Not Currently    Alcohol/week: 0.0 standard drinks of alcohol   Drug use: Never   Sexual activity: Yes    Partners: Male    Birth control/protection: I.U.D.  Other Topics Concern   Not on file  Social History Narrative   [name]   42 years old-2023   Right Handed   4-5 Cups of Tea a Day   Social Determinants of Health   Financial Resource Strain: Low Risk  (05/01/2023)   Overall Financial Resource Strain (CARDIA)    Difficulty of Paying Living Expenses: Not hard at all  Food Insecurity: No Food Insecurity (05/01/2023)   Hunger Vital Sign    Worried About Running Out of Food in the Last Year: Never true    Ran Out of Food in the Last Year: Never true  Transportation Needs: No Transportation Needs (05/01/2023)   PRAPARE - Administrator, Civil Service (Medical): No    Lack of Transportation (Non-Medical): No  Physical Activity: Sufficiently Active (05/01/2023)   Exercise Vital Sign    Days of Exercise per Week: 4 days    Minutes of Exercise per Session: 60 min  Stress: No Stress Concern Present (05/01/2023)   [employer] of Occupational Health - Occupational Stress Questionnaire    Feeling of Stress : Only a little  Social Connections: Moderately Isolated (05/01/2023)   Social Connection and Isolation Panel [NHANES]    Frequency of Communication with Friends and Family: More than three times a week     Frequency of Social Gatherings with Friends and Family: Never    Attends Religious Services: Never    Database administrator or Organizations: No    Attends Engineer, structural: Not on file    Marital Status: Married  Catering manager Violence: Low Risk  (04/14/2020)   Received from [employer] [facility]   Intimate Partner Violence    Insults You: Not on file    Threatens You: Not on file    Screams at [place]: Not on file    Physically Hurt: Not on file    Intimate Partner Violence Score:  Not on file   Current Outpatient Medications on File Prior to Visit  Medication Sig Dispense Refill   albuterol (VENTOLIN HFA) 108 (90 Base) MCG/ACT inhaler TAKE 2 PUFFS BY MOUTH EVERY 4-6 HOURS AS NEEDED FOR WHEEZE OR SHORTNESS OF BREATH 18 g 1   cetirizine (ZYRTEC) 10 MG tablet Take 10 mg by mouth daily.     EPINEPHrine 0.3 mg/0.3 mL IJ SOAJ injection Inject 0.3 mg into the muscle as needed for anaphylaxis. 1 each 0   fluticasone (FLOVENT HFA) 110 MCG/ACT inhaler Inhale 2 puffs twice a day with spacer to help prevent cough and wheeze. 1 each 3   ibuprofen (ADVIL) 200 MG tablet Take 400 mg by mouth every 6 (six) hours as needed.     Inulin (FIBER CHOICE PO) Take 5 capsules by mouth at bedtime.     levothyroxine (SYNTHROID) 150 MCG tablet Take 1 tablet (150 mcg total) by mouth daily before breakfast. 90 tablet 1   Melatonin 2.5 MG CAPS Take 1 capsule by mouth at bedtime.     Multiple Vitamin (MULTIVITAMIN) tablet Take 1 tablet by mouth daily.     Norethindrone-Ethinyl Estradiol-Fe Biphas (LO LOESTRIN FE) 1 MG-10 MCG / 10 MCG tablet TAKE 1 TABLET BY MOUTH DAILY 84 tablet 1   nortriptyline (PAMELOR) 10 MG capsule TAKE 1 CAPSULE(10 MG) BY MOUTH AT BEDTIME 90 capsule 1   Rimegepant Sulfate (NURTEC) 75 MG TBDP Take 75 mg by mouth daily as needed. For migraines. Take as close to onset of migraine as possible. One daily maximum. 16 tablet 0   Vitamin D, Cholecalciferol, 25 MCG (1000 UT) CAPS Take  1 capsule by mouth daily.     zolpidem (AMBIEN) 5 MG tablet TAKE 1 TABLET(5 MG) BY MOUTH EVERY NIGHT AT BEDTIME AS NEEDED FOR INSOMNIA 30 tablet 0   No current facility-administered medications on file prior to visit.   Allergies  Allergen Reactions   Flonase [Fluticasone Propionate] Anaphylaxis    Has alcohol in nasal spray   Hydrocodone-Acetaminophen Other (See Comments)    Suicidal    Latex Anaphylaxis   Rubbing Alcohol [Alcohol] Anaphylaxis   Topamax [Topiramate] Other (See Comments)    HI/SI   Bean Pod Extract Swelling    Butter Bean, Baked Bean: Facial Swelling   Hydrocodone     Suicidal thoughts with medication   Mucinex [Guaifenesin Er] Other (See Comments)    Lose hearing for 1-2 weeks.   Penicillins    Wellbutrin [Bupropion] Other (See Comments)    Extreme insomnia   Cephalexin Rash   Family History  Problem Relation Age of Onset   Obesity Mother        unknown   Thyroid disease Mother    Drug abuse Mother    Heart disease Father    COPD Father        smoker   Lung cancer Father    High blood pressure Father    High Cholesterol Father    Anxiety disorder Father    Thyroid disease Maternal Grandmother        unknown   Breast cancer Maternal Grandmother 29   Lung cancer Paternal Uncle    Colon cancer Paternal Uncle    Esophageal cancer Neg Hx    Migraines Neg Hx    Ovarian cancer Neg Hx    Colon polyps Neg Hx    PE: BP 120/80   Pulse 86   Ht 5\' 3"  (1.6 m)   Wt 186 lb 3.2 oz (84.5  kg)   SpO2 99%   BMI 32.98 kg/m  Wt Readings from Last 3 Encounters:  09/10/23 186 lb 3.2 oz (84.5 kg)  07/10/23 187 lb 8 oz (85 kg)  05/01/23 184 lb 9.6 oz (83.7 kg)   Constitutional: overweight, in NAD Eyes:  EOMI, no exophthalmos ENT: no neck masses, no cervical lymphadenopathy, thyroidectomy scar inconspicuous Cardiovascular: RRR, No MRG Respiratory: CTA B Musculoskeletal: no deformities Skin:no rashes Neurological: no tremor with outstretched  hands  ASSESSMENT: 1.  Postsurgical hypothyroidism - Uncontrolled  PLAN:  1. Patient with longstanding hypothyroidism, developed after total thyroidectomy as a teenager (42 years old) with significant fluctuations in her TFTs in the last 2.5 years and with several levothyroxine dose changes.  She is currently on brand-name Synthroid. - latest thyroid labs reviewed with pt. >> normal: Lab Results  Component Value Date   TSH 0.49 03/07/2023  - she continues on Synthroid d.a.w. 150 mcg daily - pt feels good on this dose, with her only concern being weight gain.  She gained approximately 2 pounds since last visit.  However, otherwise, she feels well, with less fatigue and stress, and also sleeps better. - we discussed about taking the thyroid hormone every day, with water, >30 minutes before breakfast, separated by >4 hours from acid reflux medications, calcium, iron, multivitamins. Pt. is taking it correctly.  At last visit, she mentions that she was still occasionally forgetting Synthroid doses but was trying to take 2 tablets the next day in this case.  She was previously taking levothyroxine with a protein shake containing coconut milk and we discussed about the fact that this may affect the absorption of levothyroxine.  As of now, she takes it 1h before breakfast. - will check thyroid tests today: TSH and fT4 - If labs are abnormal, she will need to return for repeat TFTs in 1.5 months -Otherwise, we will see her back in 6 months.  Needs refills.  + flu shot today  Carlus Pavlov, MD PhD [facility]

## 2023-09-10 NOTE — Patient Instructions (Addendum)
Please continue Levothyroxine 150 mcg daily  Take the thyroid hormone every day, with water, at least 30 minutes before breakfast, separated by at least 4 hours from: - acid reflux medications - calcium - iron - multivitamins  Please stop at the lab.  Please come back in 1 year for a visit and in  6 months for labs.

## 2023-09-11 MED ORDER — LEVOTHYROXINE SODIUM 150 MCG PO TABS: 150.0000 ug | ORAL_TABLET | Freq: Every day | ORAL | 1 refills | Status: DC

## 2023-09-11 NOTE — Addendum Note (Signed)
Addended by: Carlus Pavlov on: 09/11/2023 12:41 PM   Modules accepted: Orders

## 2023-09-29 ENCOUNTER — Other Ambulatory Visit: Payer: Self-pay | Admitting: Family

## 2023-09-29 DIAGNOSIS — G43709 Chronic migraine without aura, not intractable, without status migrainosus: Secondary | ICD-10-CM

## 2023-09-29 NOTE — Telephone Encounter (Signed)
Last OV: 07/10/2023 Pending OV: Nothing scheduled at this time Medication Nortriptyline 10mg  Directions: Take 1 daily Last Refill: 04/07/2023 Qty: #90 with 1 refill

## 2023-09-30 MED ORDER — NORTRIPTYLINE HCL 10 MG PO CAPS: ORAL_CAPSULE | ORAL | 0 refills | Status: DC

## 2023-11-18 ENCOUNTER — Other Ambulatory Visit: Payer: Self-pay | Admitting: Family

## 2023-11-18 DIAGNOSIS — F5101 Primary insomnia: Secondary | ICD-10-CM

## 2023-11-20 MED ORDER — ZOLPIDEM TARTRATE 5 MG PO TABS: 5.0000 mg | ORAL_TABLET | Freq: Every evening | ORAL | 0 refills | Status: DC | PRN

## 2023-12-09 ENCOUNTER — Encounter: Payer: Self-pay | Admitting: Family

## 2023-12-09 ENCOUNTER — Encounter: Payer: Self-pay | Admitting: Internal Medicine

## 2023-12-09 DIAGNOSIS — E038 Other specified hypothyroidism: Secondary | ICD-10-CM

## 2023-12-09 DIAGNOSIS — J189 Pneumonia, unspecified organism: Secondary | ICD-10-CM

## 2023-12-09 MED ORDER — LEVOTHYROXINE SODIUM 150 MCG PO TABS: 150.0000 ug | ORAL_TABLET | Freq: Every day | ORAL | 1 refills | Status: DC

## 2023-12-09 NOTE — Addendum Note (Signed)
Addended by: Pollie Meyer on: 12/09/2023 03:04 PM   Modules accepted: Orders

## 2023-12-12 ENCOUNTER — Ambulatory Visit: Payer: No Typology Code available for payment source | Admitting: Family

## 2023-12-12 ENCOUNTER — Encounter: Payer: Self-pay | Admitting: Family

## 2023-12-12 VITALS — BP 128/80 | HR 82 | Temp 98.0°F | Ht 63.0 in | Wt 188.0 lb

## 2023-12-12 DIAGNOSIS — J189 Pneumonia, unspecified organism: Secondary | ICD-10-CM | POA: Diagnosis not present

## 2023-12-12 DIAGNOSIS — E669 Obesity, unspecified: Secondary | ICD-10-CM

## 2023-12-12 DIAGNOSIS — Z6833 Body mass index (BMI) 33.0-33.9, adult: Secondary | ICD-10-CM

## 2023-12-12 DIAGNOSIS — E782 Mixed hyperlipidemia: Secondary | ICD-10-CM

## 2023-12-12 DIAGNOSIS — R091 Pleurisy: Secondary | ICD-10-CM | POA: Diagnosis not present

## 2023-12-12 DIAGNOSIS — R12 Heartburn: Secondary | ICD-10-CM | POA: Diagnosis not present

## 2023-12-12 DIAGNOSIS — R1013 Epigastric pain: Secondary | ICD-10-CM | POA: Insufficient documentation

## 2023-12-12 DIAGNOSIS — K5903 Drug induced constipation: Secondary | ICD-10-CM

## 2023-12-12 MED ORDER — OMEPRAZOLE 20 MG PO CPDR: 20.0000 mg | DELAYED_RELEASE_CAPSULE | Freq: Every day | ORAL | 0 refills | Status: DC

## 2023-12-12 MED ORDER — DOXYCYCLINE HYCLATE 100 MG PO TABS: 100.0000 mg | ORAL_TABLET | Freq: Two times a day (BID) | ORAL | 0 refills | Status: AC

## 2023-12-12 MED ORDER — OMEPRAZOLE 20 MG PO CPDR: 20.0000 mg | DELAYED_RELEASE_CAPSULE | Freq: Every day | ORAL | 3 refills | Status: DC

## 2023-12-12 MED ORDER — PREDNISONE 20 MG PO TABS: ORAL_TABLET | ORAL | 0 refills | Status: DC

## 2023-12-12 NOTE — Assessment & Plan Note (Signed)
Repeat cxr in two weeks.  Extend doxycycline 100 mg po x 3 days for total duration 10 days.  Monitor for worsening sx could consider advair x two weeks if needed Albuterol if needed.

## 2023-12-12 NOTE — Assessment & Plan Note (Signed)
Repeat lipid panel within the next one month.  Work on low cholesterol diet and exercise as tolerated

## 2023-12-12 NOTE — Patient Instructions (Addendum)
  Take omeprazole 20 mg twice daily for two weeks.   Magnesium citrate, clear liquid bottle in pharmacy for constipation if need serious relief.  Take 1/2 bottle. If no release in one hour take other half.    Regards,   Mort Sawyers FNP-C

## 2023-12-12 NOTE — Assessment & Plan Note (Signed)
Improving slightly.  Extend prednisone for five more days, if upon completion today still with residual chest tightness sob otherwise no need to extend.

## 2023-12-12 NOTE — Progress Notes (Signed)
Established Patient Office Visit  Subjective:      CC:  Chief Complaint  Patient presents with   Pneumonia    HPI: Jacqueline Wilson is a 42 y.o. female presenting on 12/12/2023 for Pneumonia . Seen here with me 11/21   Teledoc visit 11/13, started with  Started on theraflu, and then just symptoms continued to worsen.  Went to [facility] care [facility] 11/[facility] with [facility] fever. Xray showed pneumonia per pt, given levaquin, improved for a few days, but then 12/5 symptoms began worsened per her record. 12/7 went to [facility], given tessalon perrles and sent home. That night however couldn't breath so she went to the urgent care the next day (12/8) and had a repeat xray, stated that pneumonia still present and also treated her for pleurisy, given steroid shot and then given doxycycline as well.  Was also put on discharge prednisone x 5 days, on last day today of prednisone which gave her some improvement of the cough but she still feels 'really bad'. Still with coughing fits just not as intense as before, improvement with being able to lie down flat and can talk for some time without coughing.   Xray was 12/8 Last fever was 12/5, has not had one since.    New complaints: Also last visit came with food allergies and   Constipation: using mild laxatives and some stool softeners, making sure to hydrate a bit more. She thinks just from multiple medications during this period.       Social history:  Relevant past medical, surgical, family and social history reviewed and updated as indicated. Interim medical history since our last visit reviewed.  Allergies and medications reviewed and updated.  DATA REVIEWED: CHART IN EPIC     ROS: Negative unless specifically indicated above in HPI.    Current Outpatient Medications:    albuterol (VENTOLIN HFA) 108 (90 Base) MCG/ACT inhaler, TAKE 2 PUFFS BY MOUTH EVERY 4-6 HOURS AS NEEDED FOR WHEEZE OR SHORTNESS OF BREATH, Disp: 18 g,  Rfl: 1   cetirizine (ZYRTEC) 10 MG tablet, Take 10 mg by mouth daily., Disp: , Rfl:    doxycycline (VIBRA-TABS) 100 MG tablet, Take 1 tablet (100 mg total) by mouth 2 (two) times daily for 3 days., Disp: 6 tablet, Rfl: 0   EPINEPHrine 0.3 mg/0.3 mL IJ SOAJ injection, Inject 0.3 mg into the muscle as needed for anaphylaxis., Disp: 1 each, Rfl: 0   fluticasone (FLOVENT HFA) 110 MCG/ACT inhaler, Inhale 2 puffs twice a day with spacer to help prevent cough and wheeze., Disp: 1 each, Rfl: 3   ibuprofen (ADVIL) 200 MG tablet, Take 400 mg by mouth every 6 (six) hours as needed., Disp: , Rfl:    Inulin (FIBER CHOICE PO), Take 5 capsules by mouth at bedtime., Disp: , Rfl:    levothyroxine (SYNTHROID) 150 MCG tablet, Take 1 tablet (150 mcg total) by mouth daily before breakfast., Disp: 90 tablet, Rfl: 1   Melatonin 2.5 MG CAPS, Take 1 capsule by mouth at bedtime., Disp: , Rfl:    Multiple Vitamin (MULTIVITAMIN) tablet, Take 1 tablet by mouth daily., Disp: , Rfl:    Norethindrone-Ethinyl Estradiol-Fe Biphas (LO LOESTRIN FE) 1 MG-10 MCG / 10 MCG tablet, TAKE 1 TABLET BY MOUTH DAILY, Disp: 84 tablet, Rfl: 1   nortriptyline (PAMELOR) 10 MG capsule, TAKE 1 CAPSULE(10 MG) BY MOUTH AT BEDTIME, Disp: 90 capsule, Rfl: 0   predniSONE (DELTASONE) 20 MG tablet, Take two tablets for  two days then one tablet for three days, Disp: 7 tablet, Rfl: 0   Rimegepant Sulfate (NURTEC) 75 MG TBDP, Take 75 mg by mouth daily as needed. For migraines. Take as close to onset of migraine as possible. One daily maximum., Disp: 16 tablet, Rfl: 0   Vitamin D, Cholecalciferol, 25 MCG (1000 UT) CAPS, Take 1 capsule by mouth daily., Disp: , Rfl:    zolpidem (AMBIEN) 5 MG tablet, Take 1 tablet (5 mg total) by mouth at bedtime as needed for sleep., Disp: 30 tablet, Rfl: 0   omeprazole (PRILOSEC) 20 MG capsule, Take 1 capsule (20 mg total) by mouth daily., Disp: 30 capsule, Rfl: 0      Objective:    BP 128/80 (BP Location: Left Arm, Patient  Position: Sitting, Cuff Size: Normal)   Pulse 82   Temp 98 F (36.7 C) (Temporal)   Ht 5\' 3"  (1.6 m)   Wt 188 lb (85.3 kg)   SpO2 98%   BMI 33.30 kg/m   Wt Readings from Last 3 Encounters:  12/12/23 188 lb (85.3 kg)  09/10/23 186 lb 3.2 oz (84.5 kg)  07/10/23 187 lb 8 oz (85 kg)    Physical Exam Constitutional:      General: She is not in acute distress.    Appearance: Normal appearance. She is normal weight. She is not ill-appearing, toxic-appearing or diaphoretic.  HENT:     Head: Normocephalic.     Right Ear: Tympanic membrane normal.     Left Ear: Tympanic membrane normal.     Nose: Nose normal.     Mouth/Throat:     Mouth: Mucous membranes are dry.     Pharynx: No oropharyngeal exudate or posterior oropharyngeal erythema.  Eyes:     Extraocular Movements: Extraocular movements intact.     Pupils: Pupils are equal, round, and reactive to light.  Cardiovascular:     Rate and Rhythm: Normal rate and regular rhythm.     Pulses: Normal pulses.     Heart sounds: Normal heart sounds.  Pulmonary:     Effort: Pulmonary effort is normal.     Breath sounds: Normal breath sounds.  Abdominal:     Tenderness: There is generalized abdominal tenderness and tenderness in the epigastric area.  Musculoskeletal:     Cervical back: Normal range of motion.  Neurological:     General: No focal deficit present.     Mental Status: She is alert and oriented to person, place, and time. Mental status is at baseline.  Psychiatric:        Mood and Affect: Mood normal.        Behavior: Behavior normal.        Thought Content: Thought content normal.        Judgment: Judgment normal.           Assessment & Plan:  Pneumonia due to infectious organism, unspecified laterality, unspecified part of lung Assessment & Plan: Repeat cxr in two weeks.  Extend doxycycline 100 mg po x 3 days for total duration 10 days.  Monitor for worsening sx could consider advair x two weeks if  needed Albuterol if needed.    Orders: -     Doxycycline Hyclate; Take 1 tablet (100 mg total) by mouth 2 (two) times daily for 3 days.  Dispense: 6 tablet; Refill: 0 -     DG Chest 2 View; Future -     predniSONE; Take two tablets for two days then one tablet for  three days  Dispense: 7 tablet; Refill: 0  Heart burn -     Omeprazole; Take 1 capsule (20 mg total) by mouth daily.  Dispense: 30 capsule; Refill: 0 -     H. pylori breath test; Future -     Alpha-Gal Panel; Future  Pleurisy Assessment & Plan: Improving slightly.  Extend prednisone for five more days, if upon completion today still with residual chest tightness sob otherwise no need to extend.    Orders: -     DG Chest 2 View; Future -     predniSONE; Take two tablets for two days then one tablet for three days  Dispense: 7 tablet; Refill: 0  Epigastric pain Assessment & Plan: Suspect heart burn related.  However can not r/o ulcer so will give omeprazole 20 mg twice daily x 14 days.  Try to decrease and or avoid spicy foods, fried fatty foods, and also caffeine and chocolate as these can increase heartburn symptoms.   H pylori and alpha gal ordered as well.    Orders: -     H. pylori breath test; Future -     Alpha-Gal Panel; Future  Obesity (BMI 30-39.9) Assessment & Plan: Pt advised to work on diet and exercise as tolerated    Mixed hyperlipidemia Assessment & Plan: Repeat lipid panel within the next one month.  Work on low cholesterol diet and exercise as tolerated    Drug-induced constipation Assessment & Plan: Miralax and stool softener ok suggest also probiotic.  Mag citrate if absolutely needed Encouraged water intake.       Return if symptoms worsen or fail to improve.  Mort Sawyers, MSN, APRN, FNP-C [facility]

## 2023-12-12 NOTE — Assessment & Plan Note (Signed)
Pt advised to work on diet and exercise as tolerated  

## 2023-12-12 NOTE — Assessment & Plan Note (Signed)
Miralax and stool softener ok suggest also probiotic.  Mag citrate if absolutely needed Encouraged water intake.

## 2023-12-12 NOTE — Assessment & Plan Note (Signed)
Suspect heart burn related.  However can not r/o ulcer so will give omeprazole 20 mg twice daily x 14 days.  Try to decrease and or avoid spicy foods, fried fatty foods, and also caffeine and chocolate as these can increase heartburn symptoms.   H pylori and alpha gal ordered as well.

## 2023-12-22 ENCOUNTER — Ambulatory Visit
Admission: RE | Admit: 2023-12-22 | Discharge: 2023-12-22 | Disposition: A | Discharge status: Home / Self Care | Payer: No Typology Code available for payment source | Source: Ambulatory Visit | Attending: Family

## 2023-12-22 ENCOUNTER — Other Ambulatory Visit (INDEPENDENT_AMBULATORY_CARE_PROVIDER_SITE_OTHER): Payer: No Typology Code available for payment source

## 2023-12-22 DIAGNOSIS — J189 Pneumonia, unspecified organism: Secondary | ICD-10-CM

## 2023-12-22 DIAGNOSIS — R7989 Other specified abnormal findings of blood chemistry: Secondary | ICD-10-CM

## 2023-12-22 DIAGNOSIS — R091 Pleurisy: Secondary | ICD-10-CM

## 2023-12-22 DIAGNOSIS — R12 Heartburn: Secondary | ICD-10-CM | POA: Diagnosis not present

## 2023-12-22 DIAGNOSIS — E782 Mixed hyperlipidemia: Secondary | ICD-10-CM

## 2023-12-22 DIAGNOSIS — R1013 Epigastric pain: Secondary | ICD-10-CM

## 2023-12-22 NOTE — Addendum Note (Signed)
Addended by: Alvina Chou on: 12/22/2023 08:22 AM   Modules accepted: Orders

## 2023-12-25 NOTE — Addendum Note (Signed)
Addended by: Alvina Chou on: 12/25/2023 07:56 AM   Modules accepted: Orders

## 2023-12-26 MED ORDER — DOXYCYCLINE HYCLATE 100 MG PO TABS: 100.0000 mg | ORAL_TABLET | Freq: Two times a day (BID) | ORAL | 0 refills | Status: DC

## 2023-12-26 MED ORDER — PREDNISONE 20 MG PO TABS
ORAL_TABLET | ORAL | 0 refills | Status: DC
Start: 2023-12-26 — End: 2023-12-26

## 2023-12-26 MED ORDER — LEVOFLOXACIN 500 MG PO TABS: 500.0000 mg | ORAL_TABLET | Freq: Every day | ORAL | 0 refills | Status: AC

## 2023-12-26 NOTE — Addendum Note (Signed)
Addended by: Mort Sawyers on: 12/26/2023 11:43 AM   Modules accepted: Orders

## 2023-12-29 NOTE — Telephone Encounter (Signed)
Can we call to see how she is doing since last week?  Chest xray still does confirm pneumonia.  Is she feeling any better. These symptoms of pneumonia can linger for up to six weeks as this time around it has been very resistant.   If symptoms worsening please see if she can get in more urgently.

## 2023-12-29 NOTE — Telephone Encounter (Signed)
LM for pt to returncall

## 2023-12-30 ENCOUNTER — Telehealth: Payer: Self-pay | Admitting: Family

## 2023-12-30 DIAGNOSIS — J189 Pneumonia, unspecified organism: Secondary | ICD-10-CM

## 2023-12-30 NOTE — Addendum Note (Signed)
 Addended by: Mort Sawyers on: 12/30/2023 02:08 PM   Modules accepted: Orders

## 2023-12-30 NOTE — Telephone Encounter (Signed)
Spoke with pt and advised her that she would need to be fasting for her labs.   Tabitha - please advise if you want to repeat her chest xray. Thank you!

## 2023-12-30 NOTE — Telephone Encounter (Signed)
 Copied from CRM [id number]. Topic: General - Other >> Dec 30, 2023 11:09 AM Rolin D wrote: Reason for CRM: Patient is scheduled for labs on January 9th . Patient would like to know if she needs to fast for the lab work . Patient also would like to know for her January 13th follow up appointment if Dr.Dugal would like for her to also get X-Rays done .

## 2024-01-05 ENCOUNTER — Encounter: Payer: Self-pay | Admitting: Family

## 2024-01-05 ENCOUNTER — Other Ambulatory Visit: Payer: Self-pay | Admitting: Family

## 2024-01-05 DIAGNOSIS — G43709 Chronic migraine without aura, not intractable, without status migrainosus: Secondary | ICD-10-CM

## 2024-01-05 MED ORDER — NORTRIPTYLINE HCL 10 MG PO CAPS
ORAL_CAPSULE | ORAL | 0 refills | Status: DC
Start: 2024-01-05 — End: 2024-04-20

## 2024-01-08 ENCOUNTER — Encounter: Payer: Self-pay | Admitting: Family

## 2024-01-08 ENCOUNTER — Other Ambulatory Visit (INDEPENDENT_AMBULATORY_CARE_PROVIDER_SITE_OTHER): Payer: No Typology Code available for payment source

## 2024-01-08 ENCOUNTER — Ambulatory Visit (INDEPENDENT_AMBULATORY_CARE_PROVIDER_SITE_OTHER)
Admission: RE | Admit: 2024-01-08 | Discharge: 2024-01-08 | Disposition: A | Discharge status: Home / Self Care | Payer: No Typology Code available for payment source | Source: Ambulatory Visit | Attending: Family | Admitting: Family

## 2024-01-08 DIAGNOSIS — J189 Pneumonia, unspecified organism: Secondary | ICD-10-CM | POA: Diagnosis not present

## 2024-01-08 DIAGNOSIS — E782 Mixed hyperlipidemia: Secondary | ICD-10-CM | POA: Diagnosis not present

## 2024-01-08 DIAGNOSIS — R1013 Epigastric pain: Secondary | ICD-10-CM | POA: Diagnosis not present

## 2024-01-08 DIAGNOSIS — R12 Heartburn: Secondary | ICD-10-CM

## 2024-01-08 DIAGNOSIS — R7989 Other specified abnormal findings of blood chemistry: Secondary | ICD-10-CM

## 2024-01-08 DIAGNOSIS — R091 Pleurisy: Secondary | ICD-10-CM

## 2024-01-08 LAB — IBC + FERRITIN
Ferritin: 469.3 ng/mL — ABNORMAL HIGH (ref 10.0–291.0)
Iron: 96 ug/dL (ref 42–145)
Saturation Ratios: 24.7 % (ref 20.0–50.0)
TIBC: 389.2 ug/dL (ref 250.0–450.0)
Transferrin: 278 mg/dL (ref 212.0–360.0)

## 2024-01-08 LAB — LIPID PANEL
Cholesterol: 291 mg/dL — ABNORMAL HIGH (ref 0–200)
HDL: 43.5 mg/dL (ref >39.00)
LDL Cholesterol: 176 mg/dL — ABNORMAL HIGH (ref 0–99)
NonHDL: 247.11
Total CHOL/HDL Ratio: 7
Triglycerides: 357 mg/dL — ABNORMAL HIGH (ref 0.0–149.0)
VLDL: 71.4 mg/dL — ABNORMAL HIGH (ref 0.0–40.0)

## 2024-01-08 LAB — CBC
HCT: 42.6 % (ref 36.0–46.0)
Hemoglobin: 14.4 g/dL (ref 12.0–15.0)
MCHC: 33.7 g/dL (ref 30.0–36.0)
MCV: 90.9 fL (ref 78.0–100.0)
Platelets: 350 10*3/uL (ref 150.0–400.0)
RBC: 4.69 Mil/uL (ref 3.87–5.11)
RDW: 13.5 % (ref 11.5–15.5)
WBC: 6 10*3/uL (ref 4.0–10.5)

## 2024-01-09 ENCOUNTER — Other Ambulatory Visit: Payer: Self-pay | Admitting: Family

## 2024-01-09 ENCOUNTER — Encounter: Payer: Self-pay | Admitting: Family

## 2024-01-09 DIAGNOSIS — E782 Mixed hyperlipidemia: Secondary | ICD-10-CM

## 2024-01-09 LAB — H. PYLORI BREATH TEST: H. pylori Breath Test: NOT DETECTED

## 2024-01-09 MED ORDER — PRAVASTATIN SODIUM 10 MG PO TABS: 10.0000 mg | ORAL_TABLET | Freq: Every day | ORAL | 3 refills | Status: DC

## 2024-01-12 ENCOUNTER — Encounter: Payer: Self-pay | Admitting: Family

## 2024-01-12 ENCOUNTER — Ambulatory Visit: Payer: No Typology Code available for payment source | Admitting: Family

## 2024-01-12 VITALS — BP 116/82 | HR 102 | Temp 98.3°F | Ht 63.0 in | Wt 192.4 lb

## 2024-01-12 DIAGNOSIS — E782 Mixed hyperlipidemia: Secondary | ICD-10-CM | POA: Diagnosis not present

## 2024-01-12 DIAGNOSIS — F5101 Primary insomnia: Secondary | ICD-10-CM | POA: Diagnosis not present

## 2024-01-12 DIAGNOSIS — J189 Pneumonia, unspecified organism: Secondary | ICD-10-CM

## 2024-01-12 MED ORDER — ZOLPIDEM TARTRATE 5 MG PO TABS: 5.0000 mg | ORAL_TABLET | Freq: Every evening | ORAL | 2 refills | Status: DC | PRN

## 2024-01-12 NOTE — Assessment & Plan Note (Signed)
 Continue pravastatin  Repeat lipid panel x 3 months

## 2024-01-12 NOTE — Assessment & Plan Note (Addendum)
 Improving.  Pt declines repeat xray.  Will continue to monitor, did advise pt with symptoms if they are to return, fever comes, then to get the repeat xray.

## 2024-01-12 NOTE — Assessment & Plan Note (Signed)
 Ambien 5 mg nightly  Pdmp reviewed

## 2024-01-12 NOTE — Progress Notes (Signed)
 Established Patient Office Visit  Subjective:      CC:  Chief Complaint  Patient presents with   Follow-up    HPI: Jacqueline Wilson is a 43 y.o. female presenting on 01/12/2024 for Follow-up . Pneumonia, states overall symptoms are finally improving. She denies any active symptoms today. CXR from 12/22/23 with opacity/pneumonia left lower lobe. F/u recommended as a precaution however pt would like to decline this today.   She is taking zyrtec twice daily and seeing an allergist.   Hyperlipidemia: she is on pravastatin  10 mg once daily and tolerating well, just started.   Lab Results  Component Value Date   CHOL 291 (H) 01/08/2024   HDL 43.50 01/08/2024   LDLCALC 176 (H) 01/08/2024   TRIG 357.0 (H) 01/08/2024   CHOLHDL 7 01/08/2024   Hypothyroid: seeing endo currently she is on levothyroxine  150 mcg once daily. Seeing Dr. Vianne     Social history:  Relevant past medical, surgical, family and social history reviewed and updated as indicated. Interim medical history since our last visit reviewed.  Allergies and medications reviewed and updated.  DATA REVIEWED: CHART IN EPIC     ROS: Negative unless specifically indicated above in HPI.    Current Outpatient Medications:    albuterol  (VENTOLIN  HFA) 108 (90 Base) MCG/ACT inhaler, TAKE 2 PUFFS BY MOUTH EVERY 4-6 HOURS AS NEEDED FOR WHEEZE OR SHORTNESS OF BREATH, Disp: 18 g, Rfl: 1   cetirizine (ZYRTEC) 10 MG tablet, Take two tablets nightly, Disp: , Rfl:    EPINEPHrine  0.3 mg/0.3 mL IJ SOAJ injection, Inject 0.3 mg into the muscle as needed for anaphylaxis., Disp: 1 each, Rfl: 0   fluticasone  (FLOVENT  HFA) 110 MCG/ACT inhaler, Inhale 2 puffs twice a day with spacer to help prevent cough and wheeze., Disp: 1 each, Rfl: 3   ibuprofen (ADVIL) 200 MG tablet, Take 400 mg by mouth every 6 (six) hours as needed., Disp: , Rfl:    Inulin (FIBER CHOICE PO), Take 5 capsules by mouth at bedtime., Disp: , Rfl:    levothyroxine   (SYNTHROID ) 150 MCG tablet, Take 1 tablet (150 mcg total) by mouth daily before breakfast., Disp: 90 tablet, Rfl: 1   Melatonin 2.5 MG CAPS, Take 1 capsule by mouth at bedtime., Disp: , Rfl:    Multiple Vitamin (MULTIVITAMIN) tablet, Take 1 tablet by mouth daily., Disp: , Rfl:    Norethindrone-Ethinyl Estradiol-Fe Biphas (LO LOESTRIN FE ) 1 MG-10 MCG / 10 MCG tablet, TAKE 1 TABLET BY MOUTH DAILY, Disp: 84 tablet, Rfl: 1   nortriptyline  (PAMELOR ) 10 MG capsule, TAKE 1 CAPSULE(10 MG) BY MOUTH AT BEDTIME, Disp: 90 capsule, Rfl: 0   omeprazole  (PRILOSEC) 20 MG capsule, Take 1 capsule (20 mg total) by mouth daily., Disp: 30 capsule, Rfl: 0   pravastatin  (PRAVACHOL ) 10 MG tablet, Take 1 tablet (10 mg total) by mouth daily., Disp: 90 tablet, Rfl: 3   Rimegepant Sulfate (NURTEC) 75 MG TBDP, Take 75 mg by mouth daily as needed. For migraines. Take as close to onset of migraine as possible. One daily maximum., Disp: 16 tablet, Rfl: 0   Vitamin D , Cholecalciferol, 25 MCG (1000 UT) CAPS, Take 1 capsule by mouth daily., Disp: , Rfl:    zolpidem  (AMBIEN ) 5 MG tablet, Take 1 tablet (5 mg total) by mouth at bedtime as needed for sleep., Disp: 30 tablet, Rfl: 2      Objective:    BP 116/82 (BP Location: Left Arm, Patient Position: Sitting, Cuff Size: Normal)  Pulse (!) 102   Temp 98.3 F (36.8 C) (Temporal)   Ht 5' 3 (1.6 m)   Wt 192 lb 6.4 oz (87.3 kg)   SpO2 96%   BMI 34.08 kg/m   Wt Readings from Last 3 Encounters:  01/12/24 192 lb 6.4 oz (87.3 kg)  12/12/23 188 lb (85.3 kg)  09/10/23 186 lb 3.2 oz (84.5 kg)    Physical Exam Constitutional:      General: She is not in acute distress.    Appearance: Normal appearance. She is normal weight. She is not ill-appearing, toxic-appearing or diaphoretic.  HENT:     Head: Normocephalic.     Right Ear: Tympanic membrane normal.     Left Ear: Tympanic membrane normal.     Nose: Nose normal.     Mouth/Throat:     Mouth: Mucous membranes are dry.      Pharynx: No oropharyngeal exudate or posterior oropharyngeal erythema.  Eyes:     Extraocular Movements: Extraocular movements intact.     Pupils: Pupils are equal, round, and reactive to light.  Cardiovascular:     Rate and Rhythm: Normal rate and regular rhythm.     Pulses: Normal pulses.     Heart sounds: Normal heart sounds.  Pulmonary:     Effort: Pulmonary effort is normal.     Breath sounds: Normal breath sounds.  Musculoskeletal:     Cervical back: Normal range of motion.  Neurological:     General: No focal deficit present.     Mental Status: She is alert and oriented to person, place, and time. Mental status is at baseline.  Psychiatric:        Mood and Affect: Mood normal.        Behavior: Behavior normal.        Thought Content: Thought content normal.        Judgment: Judgment normal.           Assessment & Plan:  Pneumonia due to infectious organism, unspecified laterality, unspecified part of lung Assessment & Plan: Improving.  Pt declines repeat xray.  Will continue to monitor, did advise pt with symptoms if they are to return, fever comes, then to get the repeat xray.   Primary insomnia Assessment & Plan: Ambien  5 mg nightly  Pdmp reviewed    Orders: -     Zolpidem  Tartrate; Take 1 tablet (5 mg total) by mouth at bedtime as needed for sleep.  Dispense: 30 tablet; Refill: 2  Mixed hyperlipidemia Assessment & Plan: Continue pravastatin   Repeat lipid panel x 3 months       Return in about 6 months (around 07/11/2024) for follow up insomnia .  Ginger Patrick, MSN, APRN, FNP-C [facility]

## 2024-01-13 LAB — ALPHA-GAL PANEL
Allergen, Mutton, f88: <0.1 kU/L
Allergen, Pork, f26: <0.1 kU/L
Beef: <0.1 kU/L
CLASS: 0
CLASS: 0
Class: 0
GALACTOSE-ALPHA-1,3-GALACTOSE IGE*: <0.1 kU/L (ref <0.10)

## 2024-01-13 LAB — INTERPRETATION:

## 2024-01-22 ENCOUNTER — Encounter: Payer: Self-pay | Admitting: Family

## 2024-01-22 DIAGNOSIS — E782 Mixed hyperlipidemia: Secondary | ICD-10-CM

## 2024-01-23 MED ORDER — LOVASTATIN 20 MG PO TABS: 20.0000 mg | ORAL_TABLET | Freq: Every day | ORAL | 3 refills | Status: AC

## 2024-01-27 NOTE — Progress Notes (Unsigned)
PCP:  Mort Sawyers, FNP   No chief complaint on file.    HPI:      Jacqueline Wilson is a 43 y.o. G1P1001 who LMP was No LMP recorded. (Menstrual status: IUD)., presents today for her NP> 3 yrs annual examination.  Her menses are absent due to IUD and OCPs. She has the IUD for long term contraception and was started on continuous dosing Lo Loestrin for migraine headaches without aura and menstrual migraines with aura by Dr. Janene Harvey with sx improvement. She does not have intermenstrual bleeding. Has had 3 episodes of cramping with diarrhea, a month apart for past 3 months. Pt plans to f/u with allergy MD since sx worse this yr.  Neuro put her on nortriptyline nightly due to ice pick headaches. He is fine with her on continuous dosing of OCPs. These 2 meds control her migraines very well.   Sex activity: single partner, contraception - IUD. Mirena placed 04/18/17.  Last Pap: 09/17/17  Results were: no abnormalities /neg HPV DNA  Hx of STDs: none  Last mammo:  There is a FH of breast cancer in her MGM. There is no FH of ovarian cancer. Pt is MyRisk neg except PALB2 VUS 2019, IBIS=18.7%.The patient does self-breast exams.  Tobacco use: The patient denies current or previous tobacco use. Alcohol use: none No drug use.  Exercise: mod active  Colonoscopy: 2017 with polyp, internal hemorrhoid. Repeat due after 3 yrs. Pt hasn't had f/u yet.  She does get adequate calcium and Vitamin D in her diet. Labs with PCP   Past Medical History:  Diagnosis Date   Allergy    BRCA negative 08/2018   MyRisk neg except PALB2 VUS   Colon polyp 02/2016   colonoscopy; repeat due in 3 yrs   Constipation    Elevated ferritin    Family history of breast cancer 08/2018   IBIS=18.7%   History of goiter    thryoidectomy   Hyperlipidemia    Hypothyroidism    Internal hemorrhoids    Intracranial hypertension 12/31/2011   Migraine    without aura and menstrual migraine with aura; sees neuro    Pre-eclampsia    Thyroid disease    Thryoidectomy   Vitamin D deficiency     Past Surgical History:  Procedure Laterality Date   CESAREAN SECTION     COLONOSCOPY  03/27/2016   polyp, internal hemorrhoid; repeat in 3 yrs.   HEMORRHOID BANDING     POLYPECTOMY     THYROIDECTOMY  2010   treat benign goiter    Family History  Problem Relation Age of Onset   Obesity Mother        unknown   Thyroid disease Mother    Drug abuse Mother    Heart disease Father    COPD Father        smoker   Lung cancer Father    High blood pressure Father    High Cholesterol Father    Anxiety disorder Father    Thyroid disease Maternal Grandmother        unknown   Breast cancer Maternal Grandmother 31   Lung cancer Paternal Uncle    Colon cancer Paternal Uncle    Esophageal cancer Neg Hx    Migraines Neg Hx    Ovarian cancer Neg Hx    Colon polyps Neg Hx     Social History   Socioeconomic History   Marital status: Married    Spouse name: Jacqueline Wilson  Number of children: 1   Years of education: 84   Highest education level: Master's degree (e.g., MA, MS, MEng, MEd, MSW, MBA)  Occupational History   Occupation: Chief Financial Officer    Occupation: Systems analyst [employer]    Comment: OCD and related disorders   Occupation: banking  Tobacco Use   Smoking status: Never    Passive exposure: Never   Smokeless tobacco: Never  Vaping Use   Vaping status: Never Used  Substance and Sexual Activity   Alcohol use: Not Currently    Alcohol/week: 0.0 standard drinks of alcohol   Drug use: Never   Sexual activity: Yes    Partners: Male    Birth control/protection: I.U.D.  Other Topics Concern   Not on file  Social History Narrative   Jacqueline Wilson   43 years old-2023   Right Handed   4-5 Cups of Tea a Day   Social Drivers of Health   Financial Resource Strain: Low Risk  (01/12/2024)   Overall Financial Resource Strain (CARDIA)    Difficulty of Paying Living Expenses: Not  hard at all  Food Insecurity: No Food Insecurity (01/12/2024)   Hunger Vital Sign    Worried About Running Out of Food in the Last Year: Never true    Ran Out of Food in the Last Year: Never true  Transportation Needs: No Transportation Needs (01/12/2024)   PRAPARE - Administrator, Civil Service (Medical): No    Lack of Transportation (Non-Medical): No  Physical Activity: Inactive (01/12/2024)   Exercise Vital Sign    Days of Exercise per Week: 0 days    Minutes of Exercise per Session: 60 min  Stress: No Stress Concern Present (01/12/2024)   [employer] of Occupational Health - Occupational Stress Questionnaire    Feeling of Stress : Not at all  Social Connections: Moderately Integrated (01/12/2024)   Social Connection and Isolation Panel [NHANES]    Frequency of Communication with Friends and Family: More than three times a week    Frequency of Social Gatherings with Friends and Family: More than three times a week    Attends Religious Services: Never    Database administrator or Organizations: Yes    Attends Engineer, structural: More than 4 times per year    Marital Status: Married  Catering manager Violence: Low Risk  (04/14/2020)   Received from [facility]   Intimate Partner Violence    Insults You: Not on file    Threatens You: Not on file    Screams at [place]: Not on file    Physically Hurt: Not on file    Intimate Partner Violence Score: Not on file    No outpatient medications have been marked as taking for the 01/29/24 encounter (Appointment) with Venida Tsukamoto, Ilona Sorrel, PA-C.     ROS:  Review of Systems  Constitutional:  Negative for fatigue, fever and unexpected weight change.  Respiratory:  Negative for cough, shortness of breath and wheezing.   Cardiovascular:  Negative for chest pain, palpitations and leg swelling.  Gastrointestinal:  Negative for blood in stool, constipation, diarrhea, nausea and vomiting.  Endocrine:  Negative for cold intolerance, heat intolerance and polyuria.  Genitourinary:  Negative for dyspareunia, dysuria, flank pain, frequency, genital sores, hematuria, menstrual problem, pelvic pain, urgency, vaginal bleeding, vaginal discharge and vaginal pain.  Musculoskeletal:  Negative for back pain, joint swelling and myalgias.  Skin:  Negative for rash.  Neurological:  Negative for dizziness, syncope,  light-headedness, numbness and headaches.  Hematological:  Negative for adenopathy.  Psychiatric/Behavioral:  Negative for agitation, confusion, sleep disturbance and suicidal ideas. The patient is not nervous/anxious.      Objective: There were no vitals taken for this visit.   Physical Exam Constitutional:      Appearance: She is well-developed.  Genitourinary:     Vulva normal.     No vaginal discharge, erythema or tenderness.      Right Adnexa: not tender and no mass present.    Left Adnexa: not tender and no mass present.    No cervical motion tenderness or polyp.     IUD strings visualized.     Uterus is not enlarged or tender.  Breasts:    Right: No mass, nipple discharge, skin change or tenderness.     Left: No mass, nipple discharge, skin change or tenderness.  Neck:     Thyroid: No thyromegaly.  Cardiovascular:     Rate and Rhythm: Normal rate and regular rhythm.     Heart sounds: Normal heart sounds. No murmur heard. Pulmonary:     Effort: Pulmonary effort is normal.     Breath sounds: Normal breath sounds.  Abdominal:     Palpations: Abdomen is soft.     Tenderness: There is no abdominal tenderness. There is no guarding.  Musculoskeletal:        General: Normal range of motion.     Cervical back: Normal range of motion.  Neurological:     General: No focal deficit present.     Mental Status: She is alert and oriented to person, place, and time.     Cranial Nerves: No cranial nerve deficit.  Skin:    General: Skin is warm and dry.  Psychiatric:        Mood  and Affect: Mood normal.        Behavior: Behavior normal.        Thought Content: Thought content normal.        Judgment: Judgment normal.  Vitals reviewed.     Assessment/Plan: Encounter for annual routine gynecological examination  Encounter for routine checking of intrauterine contraceptive device (IUD); IUD in place. Placed 5/18--good for 7 yrs now.  Family history of breast cancer--Pt is MyRisk neg, IBIS=18.7%. No extra screening recommended. Start mammos age 70.  Intractable menstrual migraine without status migrainosus - Cont OCPs for headaches. Rx RF eRxd, F/u prn.  - Plan: Norethindrone-Ethinyl Estradiol-Fe Biphas (LO LOESTRIN FE) 1 MG-10 MCG / 10 MCG tablet  Screening for colon cancer--pt to f/u with GI for repeat colonoscopy. Will do ref prn.   Needs flu shot - Plan: Flu Vaccine QUAD 36+ mos IM (Fluarix, Quad PF)  No orders of the defined types were placed in this encounter.            GYN counsel adequate intake of calcium and vitamin D, diet and exercise     F/U  No follow-ups on file.  Hilding Quintanar B. Michaeljoseph Revolorio, PA-C 01/27/2024 4:47 PM

## 2024-01-29 ENCOUNTER — Ambulatory Visit: Payer: No Typology Code available for payment source | Admitting: Obstetrics and Gynecology

## 2024-01-29 ENCOUNTER — Encounter: Payer: Self-pay | Admitting: Obstetrics and Gynecology

## 2024-01-29 ENCOUNTER — Other Ambulatory Visit (HOSPITAL_COMMUNITY)
Admission: RE | Admit: 2024-01-29 | Discharge: 2024-01-29 | Disposition: A | Discharge status: Home / Self Care | Payer: No Typology Code available for payment source | Source: Ambulatory Visit | Attending: Obstetrics and Gynecology | Admitting: Obstetrics and Gynecology

## 2024-01-29 VITALS — BP 116/78 | HR 93 | Ht 63.0 in | Wt 192.0 lb

## 2024-01-29 DIAGNOSIS — Z1211 Encounter for screening for malignant neoplasm of colon: Secondary | ICD-10-CM

## 2024-01-29 DIAGNOSIS — Z1151 Encounter for screening for human papillomavirus (HPV): Secondary | ICD-10-CM | POA: Diagnosis present

## 2024-01-29 DIAGNOSIS — Z124 Encounter for screening for malignant neoplasm of cervix: Secondary | ICD-10-CM

## 2024-01-29 DIAGNOSIS — Z01419 Encounter for gynecological examination (general) (routine) without abnormal findings: Secondary | ICD-10-CM

## 2024-01-29 DIAGNOSIS — Z30431 Encounter for routine checking of intrauterine contraceptive device: Secondary | ICD-10-CM

## 2024-01-29 DIAGNOSIS — Z1231 Encounter for screening mammogram for malignant neoplasm of breast: Secondary | ICD-10-CM

## 2024-01-29 DIAGNOSIS — Z3041 Encounter for surveillance of contraceptive pills: Secondary | ICD-10-CM

## 2024-01-29 DIAGNOSIS — Z8041 Family history of malignant neoplasm of ovary: Secondary | ICD-10-CM

## 2024-01-29 DIAGNOSIS — Z803 Family history of malignant neoplasm of breast: Secondary | ICD-10-CM | POA: Insufficient documentation

## 2024-01-29 DIAGNOSIS — G43839 Menstrual migraine, intractable, without status migrainosus: Secondary | ICD-10-CM

## 2024-01-29 MED ORDER — LO LOESTRIN FE 1 MG-10 MCG / 10 MCG PO TABS: ORAL_TABLET | ORAL | 3 refills | Status: DC

## 2024-01-29 NOTE — Patient Instructions (Addendum)
I value your feedback and you entrusting [place] with your care. If you get a [facility] patient survey, I would appreciate you taking the time to let us know about your experience today. Thank you!  [facility] ([place])--[phone]   My Net Diary app for weight loss

## 2024-02-03 LAB — CYTOLOGY - PAP
Comment: NEGATIVE
Diagnosis: NEGATIVE
High risk HPV: NEGATIVE

## 2024-02-12 ENCOUNTER — Ambulatory Visit
Admission: RE | Admit: 2024-02-12 | Discharge: 2024-02-12 | Disposition: A | Discharge status: Home / Self Care | Payer: No Typology Code available for payment source | Source: Ambulatory Visit | Attending: Obstetrics and Gynecology | Admitting: Obstetrics and Gynecology

## 2024-02-12 DIAGNOSIS — Z1231 Encounter for screening mammogram for malignant neoplasm of breast: Secondary | ICD-10-CM | POA: Insufficient documentation

## 2024-02-16 ENCOUNTER — Encounter: Payer: Self-pay | Admitting: Family

## 2024-02-17 ENCOUNTER — Other Ambulatory Visit: Payer: Self-pay | Admitting: Family

## 2024-02-17 ENCOUNTER — Other Ambulatory Visit: Payer: Self-pay | Admitting: Obstetrics and Gynecology

## 2024-02-17 ENCOUNTER — Encounter: Payer: Self-pay | Admitting: Obstetrics and Gynecology

## 2024-02-17 DIAGNOSIS — F5101 Primary insomnia: Secondary | ICD-10-CM

## 2024-02-17 DIAGNOSIS — R928 Other abnormal and inconclusive findings on diagnostic imaging of breast: Secondary | ICD-10-CM

## 2024-02-17 MED ORDER — ZOLPIDEM TARTRATE 10 MG PO TABS: 10.0000 mg | ORAL_TABLET | Freq: Every evening | ORAL | 1 refills | Status: DC | PRN

## 2024-02-17 NOTE — Progress Notes (Signed)
Patient would like to increase her Ambien to 10 mg nightly.  She sometimes will only take 5 mg but on her night needs the entire 10 mg tablet to be able to sleep effectively.  She is requesting a refill

## 2024-02-27 ENCOUNTER — Ambulatory Visit
Admission: RE | Admit: 2024-02-27 | Discharge: 2024-02-27 | Disposition: A | Discharge status: Home / Self Care | Payer: No Typology Code available for payment source | Source: Ambulatory Visit | Attending: Obstetrics and Gynecology | Admitting: Obstetrics and Gynecology

## 2024-02-27 DIAGNOSIS — R928 Other abnormal and inconclusive findings on diagnostic imaging of breast: Secondary | ICD-10-CM | POA: Diagnosis present

## 2024-03-01 ENCOUNTER — Encounter: Payer: Self-pay | Admitting: Obstetrics and Gynecology

## 2024-03-15 ENCOUNTER — Ambulatory Visit: Payer: No Typology Code available for payment source | Admitting: Internal Medicine

## 2024-03-23 ENCOUNTER — Encounter: Payer: Self-pay | Admitting: Family

## 2024-03-23 DIAGNOSIS — F5101 Primary insomnia: Secondary | ICD-10-CM

## 2024-03-23 DIAGNOSIS — E669 Obesity, unspecified: Secondary | ICD-10-CM

## 2024-04-20 ENCOUNTER — Other Ambulatory Visit: Payer: Self-pay | Admitting: *Deleted

## 2024-04-20 DIAGNOSIS — G43709 Chronic migraine without aura, not intractable, without status migrainosus: Secondary | ICD-10-CM

## 2024-04-20 MED ORDER — NORTRIPTYLINE HCL 10 MG PO CAPS: ORAL_CAPSULE | ORAL | 0 refills | Status: DC

## 2024-04-21 ENCOUNTER — Other Ambulatory Visit: Payer: Self-pay | Admitting: Family

## 2024-04-21 ENCOUNTER — Encounter: Payer: Self-pay | Admitting: Family

## 2024-04-21 DIAGNOSIS — G43709 Chronic migraine without aura, not intractable, without status migrainosus: Secondary | ICD-10-CM

## 2024-04-27 ENCOUNTER — Ambulatory Visit: Admitting: Internal Medicine

## 2024-04-27 VITALS — BP 118/68 | HR 86 | Ht 63.0 in | Wt 189.2 lb

## 2024-04-27 DIAGNOSIS — E890 Postprocedural hypothyroidism: Secondary | ICD-10-CM

## 2024-04-27 DIAGNOSIS — E038 Other specified hypothyroidism: Secondary | ICD-10-CM

## 2024-04-27 LAB — TSH: TSH: 0.93 m[IU]/L

## 2024-04-27 LAB — T4, FREE: Free T4: 1.5 ng/dL (ref 0.8–1.8)

## 2024-04-27 NOTE — Patient Instructions (Signed)
 Please continue Levothyroxine  150 mcg daily  Take the thyroid  hormone every day, with water, at least 30 minutes before breakfast, separated by at least 4 hours from: - acid reflux medications - calcium  - iron - multivitamins  Please stop at the lab.  Please come back in 1 year.

## 2024-04-27 NOTE — Progress Notes (Unsigned)
 Patient ID: Jacqueline Wilson, female   DOB: 04/12/1981, 43 y.o.   MRN: [id number]  HPI  Jacqueline Wilson is a 43 y.o.-year-old female, returning for follow-up for postsurgical hypothyroidism.  She previously saw Dr. Washington Hacker, but last visit with me 7 months ago.  Interim history: She tells me that she is sleeping better after she quit her second job and also after stopping Ambien .  She also feels that her stress is lower.  She is still frustrated about weight gain.  She had PNA 11/2023. She was on 3 rounds of ABx and 3 of steroids. She started on a statin 12/2023 - Lovastatin . She had a stomach ulcer >> was on PPIs at night >> now off for 2 weeks.  Reviewed and addended history: Pt. has a significant family history of large goiters.  She had to have total thyroidectomy for enlarging thyroid  at 43 y/o (at [facility]). She had multiple U/S since then >> no regrowth.  For postsurgical hypothyroidism, she has been on Levothyroxine  150 mcg daily for years, then tests started to greatly fluctuate.  Her dose was increased to 175 mcg daily (suppressive dose) >> started to feel poorly: dizziness, presyncopal, increased hunger, muscle injuries >>  tried again 150 mcg daily, then 150 mcg 6/7 days >> but then on Levothyroxine  167 mcg daily (combination dose: 175 mcg 5/7 days, 150 mcg 2/7 days).  She has felt poorly during the above dose changes, previously working out consistently - 2h a day (strength training), but then with significant fatigue and not able to exercise.  She also gained weight despite reduced calories.   We decreased the dose to 150 mcg daily in 11/2022.  She felt  better after decreasing the dose of Synthroid  in 11/2022: less fatigue, sleeping better, appetite controlled, less HAs.  She takes Levothyroxine  (now generic): - in am - fasting  - 30 min-1h from b'fast - no calcium  - no iron - + multivitamins at night - no PPIs - not on Biotin - + fiber supplements, vitamin D ,at night  I reviewed pt's  thyroid  tests: Lab Results  Component Value Date   TSH 0.72 09/10/2023   TSH 0.49 03/07/2023   TSH 0.20 (L) 11/29/2022   TSH 17.29 (H) 10/07/2022   TSH 0.051 (L) 06/05/2022   TSH 0.26 (L) 04/08/2022   TSH 0.42 10/24/2021   TSH 0.07 (L) 09/17/2021   TSH 0.04 (L) 01/09/2021   TSH 2.57 07/31/2020   FREET4 1.29 09/10/2023   FREET4 1.22 03/07/2023   FREET4 1.29 11/29/2022   FREET4 1.3 10/07/2022   FREET4 2.15 (H) 06/05/2022   FREET4 1.7 04/08/2022   FREET4 1.5 10/24/2021   FREET4 1.5 09/17/2021   FREET4 1.7 01/09/2021   FREET4 1.28 07/08/2016   T3FREE 4.1 11/29/2022   T3FREE 3.5 06/05/2022   T3FREE 2.8 09/17/2021   T3FREE 2.7 01/09/2021   Antithyroid antibodies: No results found for: "THGAB" No components found for: "TPOAB"  Pt denies feeling nodules in neck, hoarseness, dysphagia/odynophagia.  She has + FH of thyroid  disorders in: M and MGM - goiter, niece with Hashimoto's. No FH of thyroid  cancer. + FH of Cushing's sd. In aunt. No h/o radiation tx to head or neck. No recent use of iodine supplements.  Pt. also has a history of overweight - previously seeing weight management clinic. Of note, a dexamethasone  suppression test was normal (cortisol 0.9) on 09/17/2021. She has colonic polyps - has colonoscopy q2 years. She also has internal hemorrhoids - banded. + FH of colon  cancer.  ROS: + see HPI  Past Medical History:  Diagnosis Date   Allergy     BRCA negative 08/2018   MyRisk neg except PALB2 VUS   Colon polyp 02/2016   colonoscopy; repeat due in 3 yrs   Constipation    Elevated ferritin    Family history of breast cancer 08/2018   IBIS=18.7%   History of goiter    thryoidectomy   Hyperlipidemia    Hypothyroidism    Internal hemorrhoids    Intracranial hypertension 12/31/2011   Migraine    without aura and menstrual migraine with aura; sees neuro   Pre-eclampsia    Thyroid  disease    Thryoidectomy   Vitamin D  deficiency    Past Surgical History:   Procedure Laterality Date   CESAREAN SECTION     COLONOSCOPY  03/27/2016   polyp, internal hemorrhoid; repeat in 3 yrs.   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  Other Topics Concern   Not on file  Social History Narrative   Jacqueline Wilson   43 years old-2023   Right Handed   4-5 Cups of Tea a Day   Social Drivers of Health   Financial Resource Strain: Low Risk  (01/12/2024)   Overall Financial Resource Strain (CARDIA)    Difficulty of Paying Living Expenses: Not hard at all  Food Insecurity: No Food Insecurity (01/12/2024)   Hunger Vital Sign    Worried About Running Out of Food in the Last Year: Never true    Ran Out of Food in the Last Year: Never true  Transportation Needs: No Transportation Needs (01/12/2024)   PRAPARE - Administrator, Civil Service (Medical): No    Lack of Transportation (Non-Medical): No  Physical Activity: Inactive (01/12/2024)   Exercise Vital Sign    Days of Exercise per Week: 0 days    Minutes of Exercise per Session: 60 min  Stress: No Stress Concern Present (01/12/2024)   [employer] of Occupational  Health - Occupational Stress Questionnaire    Feeling of Stress : Not at all  Social Connections: Moderately Integrated (01/12/2024)   Social Connection and Isolation Panel [NHANES]    Frequency of Communication with Friends and Family: More than three times a week    Frequency of Social Gatherings with Friends and Family: More than three times a week    Attends Religious Services: Never    Database administrator or Organizations: Yes    Attends Engineer, structural: More than 4 times  per year    Marital Status: Married  Catering manager Violence: Low Risk  (04/14/2020)   [facility]   Intimate Partner Violence    Insults You: Not on file    Threatens You: Not on file    Screams at You: Not on file    Physically Hurt: Not on file    Intimate Partner Violence Score: Not on file   Current Outpatient Medications on File Prior to Visit  Medication Sig Dispense Refill   albuterol  (VENTOLIN  HFA) 108 (90 Base) MCG/ACT inhaler TAKE 2 PUFFS BY MOUTH EVERY 4-6 HOURS AS NEEDED FOR WHEEZE OR SHORTNESS OF BREATH 18 g 1   cetirizine (ZYRTEC) 10 MG tablet Take two tablets nightly     EPINEPHrine  0.3 mg/0.3 mL IJ SOAJ injection Inject 0.3 mg into the muscle as needed for anaphylaxis. 1 each 0   fluticasone  (FLOVENT  HFA) 110 MCG/ACT inhaler Inhale 2 puffs twice a day with spacer to help prevent cough and wheeze. 1 each 3   ibuprofen (ADVIL) 200 MG tablet Take 400 mg by mouth every 6 (six) hours as needed.     Inulin (FIBER CHOICE PO) Take 5 capsules by mouth at bedtime.     levonorgestrel  (MIRENA ) 20 MCG/DAY IUD 1 each by Intrauterine route once.     levothyroxine  (SYNTHROID ) 150 MCG tablet Take 1 tablet (150 mcg total) by mouth daily before breakfast. 90 tablet 1   lovastatin  (MEVACOR ) 20 MG tablet Take 1 tablet (20 mg total) by mouth at bedtime. 90 tablet 3   Melatonin 2.5 MG CAPS Take 1 capsule by mouth at bedtime.     Multiple Vitamin (MULTIVITAMIN) tablet  Take 1 tablet by mouth daily.     Norethindrone-Ethinyl Estradiol-Fe Biphas (LO LOESTRIN FE ) 1 MG-10 MCG / 10 MCG tablet TAKE 1 TABLET BY MOUTH DAILY 84 tablet 3   nortriptyline  (PAMELOR ) 10 MG capsule TAKE 1 CAPSULE(10 MG) BY MOUTH AT BEDTIME 90 capsule 0   omeprazole  (PRILOSEC) 20 MG capsule Take 1 capsule (20 mg total) by mouth daily. 30 capsule 0   Vitamin D , Cholecalciferol, 25 MCG (1000 UT) CAPS Take 1 capsule by mouth daily.     zolpidem  (AMBIEN ) 10 MG tablet Take 1 tablet (10 mg total) by mouth at bedtime as needed for sleep. 15 tablet 1   No current facility-administered medications on file prior to visit.   Allergies  Allergen Reactions   Flonase  [Fluticasone  Propionate] Anaphylaxis    Has alcohol in nasal spray   Hydrocodone-Acetaminophen Other (See Comments)    Suicidal    Latex Anaphylaxis   Rubbing Alcohol [Alcohol] Anaphylaxis   Topamax [Topiramate] Other (See Comments)    HI/SI   Bean Pod Extract Swelling    Butter Bean, Baked Bean: Facial Swelling   Hydrocodone     Suicidal thoughts with medication   Mucinex [Guaifenesin Er] Other (See Comments)    Lose hearing for 1-2 weeks.   Penicillins    Wellbutrin  [Bupropion ] Other (See Comments)    Extreme insomnia   Cephalexin Rash   Trazodone And Nefazodone Other (See Comments)   Family History  Problem Relation Age of Onset   Obesity Mother        unknown   Thyroid  disease Mother    Drug abuse Mother    Heart disease Father    COPD Father        smoker   Lung cancer Father    High blood pressure Father  High Cholesterol Father    Anxiety disorder Father    Thyroid  disease Maternal Grandmother        unknown   Breast cancer Maternal Grandmother 27   Lung cancer Paternal Uncle    Colon cancer Paternal Uncle    Esophageal cancer Neg Hx    Migraines Neg Hx    Ovarian cancer Neg Hx    Colon polyps Neg Hx    PE: BP 118/68   Pulse 86   Ht 5\' 3"  (1.6 m)   Wt 189 lb 3.2 oz (85.8 kg)   SpO2 98%   BMI  33.52 kg/m  Wt Readings from Last 3 Encounters:  04/27/24 189 lb 3.2 oz (85.8 kg)  01/29/24 192 lb (87.1 kg)  01/12/24 192 lb 6.4 oz (87.3 kg)   Constitutional: overweight, in NAD Eyes:  EOMI, no exophthalmos ENT: no neck masses, no cervical lymphadenopathy, thyroidectomy scar inconspicuous Cardiovascular: RRR, No MRG Respiratory: CTA B Musculoskeletal: no deformities Skin:no rashes Neurological: no tremor with outstretched hands  ASSESSMENT: 1.  Postsurgical hypothyroidism - Uncontrolled  PLAN:  1. Patient with longstanding hypothyroidism developed after total thyroidectomy when she was 43 years old, with significant fluctuations in her TFTs in the last 3 years. - latest thyroid  labs reviewed with pt. >> normal: Lab Results  Component Value Date   TSH 0.72 09/10/2023  - she continues on LT4 150 mcg daily - pt feels good on this dose. - we discussed about taking the thyroid  hormone every day, with water, >30 minutes before breakfast, separated by >4 hours from acid reflux medications, calcium , iron, multivitamins. Pt. is taking it correctly.  We did discuss that if she misses the levothyroxine , she could take 2 tablets of levothyroxine  next day.  She was previously taking levothyroxine  with a protein shake containing coconut milk but then moved it 1 hour before breakfast. - will check thyroid  tests today: TSH and fT4 - If labs are abnormal, she will need to return for repeat TFTs in 1.5 months - I will see her back in a year  Needs refills.  Emilie Harden, MD PhD [facility] Endocrinology

## 2024-04-28 ENCOUNTER — Encounter: Payer: Self-pay | Admitting: Internal Medicine

## 2024-04-28 MED ORDER — LEVOTHYROXINE SODIUM 150 MCG PO TABS: 150.0000 ug | ORAL_TABLET | Freq: Every day | ORAL | 3 refills | Status: AC

## 2024-05-17 ENCOUNTER — Encounter: Payer: Self-pay | Admitting: Family

## 2024-05-19 NOTE — Telephone Encounter (Signed)
 Jacqueline Wilson Have we already reviewed this one and or is it in my box?

## 2024-05-21 NOTE — Telephone Encounter (Signed)
 I sent the paperwork back to you it was positive for OSA right?

## 2024-07-12 ENCOUNTER — Encounter: Payer: Self-pay | Admitting: Family

## 2024-07-12 ENCOUNTER — Ambulatory Visit: Payer: Self-pay | Admitting: Family

## 2024-07-12 VITALS — BP 118/76 | HR 92 | Temp 98.0°F | Ht 63.0 in | Wt 192.0 lb

## 2024-07-12 DIAGNOSIS — G4733 Obstructive sleep apnea (adult) (pediatric): Secondary | ICD-10-CM | POA: Insufficient documentation

## 2024-07-12 DIAGNOSIS — G932 Benign intracranial hypertension: Secondary | ICD-10-CM | POA: Diagnosis not present

## 2024-07-12 DIAGNOSIS — F458 Other somatoform disorders: Secondary | ICD-10-CM | POA: Insufficient documentation

## 2024-07-12 DIAGNOSIS — F5101 Primary insomnia: Secondary | ICD-10-CM

## 2024-07-12 MED ORDER — TIZANIDINE HCL 4 MG PO TABS: ORAL_TABLET | ORAL | 0 refills | Status: DC

## 2024-07-12 NOTE — Assessment & Plan Note (Signed)
 Trial tizanidine  nightly prn  Discussed f/u with dentist for bite block as well.

## 2024-07-12 NOTE — Assessment & Plan Note (Signed)
 Blood pressure stable Pt with continuous headaches, did advise to f/u with neurology however pt declines.  We will monitor blood pressure, pt to alter lifestyle to reduce stress and exercise more as tolerated in the next few weeks to see if sx are to improve. She will consider seeing neurology in the future if sx persist.

## 2024-07-12 NOTE — Assessment & Plan Note (Signed)
 Discussed options such as oral device and or cpap. Pt declines both at this time and decides to pursue lifestyle changes which is encouraged.

## 2024-07-12 NOTE — Assessment & Plan Note (Signed)
 Ongoing.  Could consider lunesta, pt declines today.

## 2024-07-12 NOTE — Progress Notes (Signed)
 Established Patient Office Visit  Subjective:      CC:  Chief Complaint  Patient presents with   Medical Management of Chronic Issues    Discuss results from Sleep study    HPI: Jacqueline Wilson is a 43 y.o. female presenting on 07/12/2024 for Medical Management of Chronic Issues (Discuss results from Sleep study)  Osa, mild on recent sleep study results. Snores 208 times an hour.   She has poor sleep, she doesn't ever feel rested. She does wake up with headaches. She has been told that she snores while she lies on her back.   Sleep is very poor, overworked at work working over 80 hours a week however she is cutting down her workload and starting up with a Systems analyst. She is starting this in August to address her health issues. She states food is more as an 'on the go at this point' but she is working on changing her diet around as well.   She recently starting burping often and with hiccups often which is unusual for her, and she has had headaches prior so she was taking ibuprofen regularly which she thinks contributed to her 'ulcer'. She states since stopping ibuprofen and doing a trial of omeprazole  those symptoms have resolved. She does grind her teeth due to being overwhelmed so this probably contributes to her headache as well she states.        Social history:  Relevant past medical, surgical, family and social history reviewed and updated as indicated. Interim medical history since our last visit reviewed.  Allergies and medications reviewed and updated.  DATA REVIEWED: CHART IN EPIC     ROS: Negative unless specifically indicated above in HPI.    Current Outpatient Medications:    tiZANidine  (ZANAFLEX ) 4 MG tablet, Take 1/2 to one tablet once nightly prn muscle spasm, Disp: 30 tablet, Rfl: 0   albuterol  (VENTOLIN  HFA) 108 (90 Base) MCG/ACT inhaler, TAKE 2 PUFFS BY MOUTH EVERY 4-6 HOURS AS NEEDED FOR WHEEZE OR SHORTNESS OF BREATH, Disp: 18 g, Rfl: 1    cetirizine (ZYRTEC) 10 MG tablet, Take two tablets nightly, Disp: , Rfl:    EPINEPHrine  0.3 mg/0.3 mL IJ SOAJ injection, Inject 0.3 mg into the muscle as needed for anaphylaxis., Disp: 1 each, Rfl: 0   fluticasone  (FLOVENT  HFA) 110 MCG/ACT inhaler, Inhale 2 puffs twice a day with spacer to help prevent cough and wheeze., Disp: 1 each, Rfl: 3   ibuprofen (ADVIL) 200 MG tablet, Take 400 mg by mouth every 6 (six) hours as needed., Disp: , Rfl:    Inulin (FIBER CHOICE PO), Take 5 capsules by mouth at bedtime., Disp: , Rfl:    levonorgestrel  (MIRENA ) 20 MCG/DAY IUD, 1 each by Intrauterine route once., Disp: , Rfl:    levothyroxine  (SYNTHROID ) 150 MCG tablet, Take 1 tablet (150 mcg total) by mouth daily before breakfast., Disp: 90 tablet, Rfl: 3   lovastatin  (MEVACOR ) 20 MG tablet, Take 1 tablet (20 mg total) by mouth at bedtime., Disp: 90 tablet, Rfl: 3   Melatonin 2.5 MG CAPS, Take 1 capsule by mouth at bedtime., Disp: , Rfl:    Multiple Vitamin (MULTIVITAMIN) tablet, Take 1 tablet by mouth daily., Disp: , Rfl:    Norethindrone-Ethinyl Estradiol-Fe Biphas (LO LOESTRIN FE ) 1 MG-10 MCG / 10 MCG tablet, TAKE 1 TABLET BY MOUTH DAILY, Disp: 84 tablet, Rfl: 3   nortriptyline  (PAMELOR ) 10 MG capsule, TAKE 1 CAPSULE(10 MG) BY MOUTH AT BEDTIME, Disp: 90 capsule, Rfl: 0  omeprazole  (PRILOSEC) 20 MG capsule, Take 1 capsule (20 mg total) by mouth daily., Disp: 30 capsule, Rfl: 0   Vitamin D , Cholecalciferol, 25 MCG (1000 UT) CAPS, Take 1 capsule by mouth daily., Disp: , Rfl:       Objective:    BP 118/76   Pulse 92   Temp 98 F (36.7 C) (Temporal)   Ht 5' 3 (1.6 m)   Wt 192 lb (87.1 kg)   SpO2 97%   BMI 34.01 kg/m   Wt Readings from Last 3 Encounters:  07/12/24 192 lb (87.1 kg)  04/27/24 189 lb 3.2 oz (85.8 kg)  01/29/24 192 lb (87.1 kg)    Physical Exam Vitals reviewed.  Constitutional:      General: She is not in acute distress.    Appearance: Normal appearance. She is normal weight. She is  not ill-appearing, toxic-appearing or diaphoretic.  HENT:     Head: Normocephalic.  Cardiovascular:     Rate and Rhythm: Normal rate and regular rhythm.  Pulmonary:     Effort: Pulmonary effort is normal.     Breath sounds: Normal breath sounds.  Musculoskeletal:        General: Normal range of motion.  Neurological:     General: No focal deficit present.     Mental Status: She is alert and oriented to person, place, and time. Mental status is at baseline.  Psychiatric:        Mood and Affect: Mood normal.        Behavior: Behavior normal.        Thought Content: Thought content normal.        Judgment: Judgment normal.           Assessment & Plan:  Bruxism Assessment & Plan: Trial tizanidine  nightly prn  Discussed f/u with dentist for bite block as well.   Orders: -     tiZANidine  HCl; Take 1/2 to one tablet once nightly prn muscle spasm  Dispense: 30 tablet; Refill: 0  Mild obstructive sleep apnea Assessment & Plan: Discussed options such as oral device and or cpap. Pt declines both at this time and decides to pursue lifestyle changes which is encouraged.     Intracranial hypertension Assessment & Plan: Blood pressure stable Pt with continuous headaches, did advise to f/u with neurology however pt declines.  We will monitor blood pressure, pt to alter lifestyle to reduce stress and exercise more as tolerated in the next few weeks to see if sx are to improve. She will consider seeing neurology in the future if sx persist.      Primary insomnia Assessment & Plan: Ongoing.  Could consider lunesta, pt declines today.      Return in about 3 months (around 10/12/2024) for f/u CPE.  Ginger Patrick, MSN, APRN, FNP-C [facility]

## 2024-08-02 ENCOUNTER — Encounter: Payer: Self-pay | Admitting: Family

## 2024-08-02 DIAGNOSIS — G43709 Chronic migraine without aura, not intractable, without status migrainosus: Secondary | ICD-10-CM

## 2024-08-02 DIAGNOSIS — F458 Other somatoform disorders: Secondary | ICD-10-CM

## 2024-08-02 MED ORDER — NORTRIPTYLINE HCL 10 MG PO CAPS: ORAL_CAPSULE | ORAL | 0 refills | Status: DC

## 2024-08-02 MED ORDER — TIZANIDINE HCL 4 MG PO TABS: ORAL_TABLET | ORAL | 0 refills | Status: DC

## 2024-08-03 ENCOUNTER — Encounter: Payer: Self-pay | Admitting: Obstetrics and Gynecology

## 2024-08-03 ENCOUNTER — Other Ambulatory Visit: Payer: Self-pay | Admitting: Obstetrics and Gynecology

## 2024-08-03 DIAGNOSIS — G43839 Menstrual migraine, intractable, without status migrainosus: Secondary | ICD-10-CM

## 2024-08-03 DIAGNOSIS — Z3041 Encounter for surveillance of contraceptive pills: Secondary | ICD-10-CM

## 2024-08-03 MED ORDER — LO LOESTRIN FE 1 MG-10 MCG / 10 MCG PO TABS: ORAL_TABLET | ORAL | 1 refills | Status: AC

## 2024-08-03 NOTE — Progress Notes (Signed)
 Rx Rf sent to [facility] due to pharm change.

## 2024-09-01 ENCOUNTER — Encounter: Payer: Self-pay | Admitting: Family

## 2024-09-06 ENCOUNTER — Other Ambulatory Visit: Payer: Self-pay | Admitting: Family

## 2024-09-06 DIAGNOSIS — F458 Other somatoform disorders: Secondary | ICD-10-CM

## 2024-10-10 ENCOUNTER — Other Ambulatory Visit: Payer: Self-pay | Admitting: Family

## 2024-10-10 DIAGNOSIS — F458 Other somatoform disorders: Secondary | ICD-10-CM

## 2024-10-27 ENCOUNTER — Encounter: Payer: Self-pay | Admitting: Family

## 2024-11-03 ENCOUNTER — Other Ambulatory Visit: Payer: Self-pay | Admitting: Family

## 2024-11-03 DIAGNOSIS — F458 Other somatoform disorders: Secondary | ICD-10-CM

## 2024-11-03 DIAGNOSIS — G43709 Chronic migraine without aura, not intractable, without status migrainosus: Secondary | ICD-10-CM

## 2024-12-07 ENCOUNTER — Other Ambulatory Visit: Payer: Self-pay | Admitting: Family

## 2024-12-07 DIAGNOSIS — F458 Other somatoform disorders: Secondary | ICD-10-CM

## 2024-12-24 ENCOUNTER — Ambulatory Visit: Payer: Self-pay

## 2024-12-24 NOTE — Telephone Encounter (Signed)
 Sinus infection x 8 days with clear mucus Took Sudafed for it Resolved 2 days ago  But for 2 days pt states she is SOB with wheezing/tightness, reports I get pneumonia every year, every year since I was a kid, pt sounds audibly short of breath. ED advised, her husband is to drive her.   FYI Only or Action Required?: Action required by provider: update on patient condition.  Patient was last seen in primary care on 07/12/2024 by Corwin Antu, FNP.  Called Nurse Triage reporting URI.  Symptoms began several days ago.  Interventions attempted: Rest, hydration, or home remedies.  Symptoms are: gradually worsening.  Triage Disposition: Go to ED Now (or PCP Triage)  Patient/caregiver understands and will follow disposition?:    Copied from CRM #[id number]. Topic: Clinical - Red Word Triage >> Dec 24, 2024 10:10 AM [place] MATSU wrote: Red Word that prompted transfer to Nurse Triage: per patient she is having pneumonia symtomps shortness breath cough and congestion and fatigue and dizzyness and sinus drainage. Reason for Disposition  Patient sounds very sick or weak to the triager  Answer Assessment - Initial Assessment Questions 1. ONSET: When did the nasal discharge start?      9 days ago pt reports she had a sinus infection, 2 days ago respiratory symptoms began  2. AMOUNT: How much discharge is there?      Clear drainage  3. COUGH: Do you have a cough? If Yes, ask: Describe the color of your mucus. (e.g., clear, white, yellow, green)     Yes, barky  4. RESPIRATORY DISTRESS: Describe your breathing.  If I sit still I'm ok. But if I move around even a little bit, I start panting, or if I talk too much I start gasping. Pt audibly dyspneic, although able to speak in full sentences  5. FEVER: Do you have a fever? If Yes, ask: What is your temperature, how was it measured, and when did it start?     Denies  6. SEVERITY: Overall, how bad are you feeling right now?  (e.g., doesn't interfere with normal activities, staying home from school/work, staying in bed)       7. OTHER SYMPTOMS: Do you have any other symptoms? (e.g., earache, mouth sores, sore throat, wheezing)     Feels like a waistband around her chest; barky cough now  Sinus infection x 8 days with clear mucus Took Sudafed for it Resolved 2 days ago  But now SOB with wheezing/tightness, reports I get pneumonia every year, every year since I was a kid, pt sounds audibly short of breath. ED advised, her husband is to drive her.  Protocols used: Common Cold-A-AH

## 2024-12-24 NOTE — Telephone Encounter (Signed)
 Left detailed message on VM advising pt Jacqueline Wilson has left for the day and we only have 3 providers in the office. Advised that she needs to go to an urgent care to be evaluated.   Please provide message from provider/office when call is returned from patient.   Will send to Tabitha as FYI

## 2024-12-26 NOTE — Telephone Encounter (Signed)
 NOTED

## 2024-12-27 ENCOUNTER — Ambulatory Visit (INDEPENDENT_AMBULATORY_CARE_PROVIDER_SITE_OTHER): Payer: Self-pay | Admitting: Family

## 2024-12-27 ENCOUNTER — Encounter: Payer: Self-pay | Admitting: Family

## 2024-12-27 VITALS — BP 108/80 | HR 103 | Temp 98.0°F | Wt 200.8 lb

## 2024-12-27 DIAGNOSIS — T782XXA Anaphylactic shock, unspecified, initial encounter: Secondary | ICD-10-CM

## 2024-12-27 DIAGNOSIS — J22 Unspecified acute lower respiratory infection: Secondary | ICD-10-CM | POA: Diagnosis not present

## 2024-12-27 MED ORDER — EPINEPHRINE 0.3 MG/0.3ML IJ SOAJ: 0.3000 mg | INTRAMUSCULAR | 0 refills | Status: AC | PRN

## 2024-12-27 MED ORDER — PREDNISONE 20 MG PO TABS: ORAL_TABLET | ORAL | 0 refills | Status: AC

## 2024-12-27 MED ORDER — DOXYCYCLINE HYCLATE 100 MG PO TABS: 100.0000 mg | ORAL_TABLET | Freq: Two times a day (BID) | ORAL | 0 refills | Status: AC

## 2024-12-27 NOTE — Progress Notes (Signed)
 "  Established Patient Office Visit  Subjective:      CC:  Chief Complaint  Patient presents with   Acute Visit    Reports sinus congestion, sinus congestion, coughing and headache x11 days. Cough is worse when she is laying down, moving around and speaking.    HPI: Jacqueline Wilson is a 43 y.o. female presenting on 12/27/2024 for Acute Visit (Reports sinus congestion, sinus congestion, coughing and headache x11 days. Cough is worse when she is laying down, moving around and speaking.) .  Discussed the use of AI scribe software for clinical note transcription with the patient, who gave verbal consent to proceed.  History of Present Illness Jacqueline Wilson is a 43 year old female with recurrent pneumonia who presents with a worsening cough and chest symptoms.  Her symptoms began on Monday with a raw feeling in her throat and a worsening cough. Initially, she experienced sinus infection symptoms for eight days, which she managed with Sudafed and Afrin. The symptoms then progressed to her chest, with significant drainage but no sinus pressure.  The cough has worsened, causing lightheadedness and 'darkening around the edges of her vision.' She also reports pain in the lower right quadrant and center of her chest, which occurs with deep breathing or coughing. Speaking exacerbates the cough, and she has difficulty sleeping, only managing two hours at a time due to 'drowning' in her symptoms.  Her past medical history includes recurrent pneumonia, with a particularly severe episode last year that lasted two months and required four courses of antibiotics. She recalls that doxycycline  was effective during that episode. She has a known allergy  to penicillin and cephalexin, which cause a rash and itching. She avoids using her albuterol  inhaler and notes that her current inhaler may be outdated.  No fever. No sinus pressure currently, but significant drainage. Ears feel full but improving. No sore  throat currently.         Social history:  Relevant past medical, surgical, family and social history reviewed and updated as indicated. Interim medical history since our last visit reviewed.  Allergies and medications reviewed and updated.  DATA REVIEWED: CHART IN EPIC     ROS: Negative unless specifically indicated above in HPI.   Current Medications[1]        Objective:        BP 108/80 (BP Location: Left Arm, Patient Position: Sitting, Cuff Size: Large)   Pulse (!) 103   Temp 98 F (36.7 C) (Temporal)   Wt 200 lb 12.8 oz (91.1 kg)   SpO2 98%   BMI 35.57 kg/m   Physical Exam CHEST: Lungs clear to auscultation bilaterally.  Wt Readings from Last 3 Encounters:  12/27/24 200 lb 12.8 oz (91.1 kg)  07/12/24 192 lb (87.1 kg)  04/27/24 189 lb 3.2 oz (85.8 kg)    Physical Exam Constitutional:      General: She is not in acute distress.    Appearance: Normal appearance. She is normal weight. She is not ill-appearing, toxic-appearing or diaphoretic.  HENT:     Head: Normocephalic.     Right Ear: Tympanic membrane normal.     Left Ear: Tympanic membrane normal.     Nose: Nose normal.     Mouth/Throat:     Mouth: Mucous membranes are dry.     Pharynx: No oropharyngeal exudate or posterior oropharyngeal erythema.  Eyes:     Extraocular Movements: Extraocular movements intact.     Pupils: Pupils are equal, round, and reactive  to light.  Cardiovascular:     Rate and Rhythm: Normal rate and regular rhythm.     Pulses: Normal pulses.     Heart sounds: Normal heart sounds.  Pulmonary:     Effort: Pulmonary effort is normal.     Breath sounds: Normal breath sounds.  Musculoskeletal:     Cervical back: Normal range of motion.  Neurological:     General: No focal deficit present.     Mental Status: She is alert and oriented to person, place, and time. Mental status is at baseline.  Psychiatric:        Mood and Affect: Mood normal.        Behavior:  Behavior normal.        Thought Content: Thought content normal.        Judgment: Judgment normal.          Results   Assessment & Plan:   Assessment and Plan Assessment & Plan Acute lower respiratory infection Symptoms began on Monday with a worsening cough, lightheadedness, and central chest pain exacerbated by deep breathing or coughing. No fever reported. Lungs are clear on examination. Differential includes bronchitis or lower respiratory infection. Previous pneumonia in December of last year. Doxycycline  was effective in the past. - Prescribed doxycycline  for lower respiratory infection. - Prescribed prednisone  2 tablets once daily for 5 days if needed for breathing or cough. - Advised use of albuterol  inhaler as needed. - Refilled albuterol  inhaler. - Advised use of nebulizer if asthma symptoms worsen and albuterol  is insufficient.  Asthma Symptoms include wheezing and tightness, potentially exacerbated by current respiratory infection. Avoidance of albuterol  noted, but advised to use as needed. - Advised use of albuterol  inhaler as needed. - Refilled albuterol  inhaler. - Advised use of nebulizer if asthma symptoms worsen and albuterol  is insufficient.        Return if symptoms worsen or fail to improve.     Ginger Patrick, MSN, APRN, FNP-C  [facility]        [1]  Current Outpatient Medications:    doxycycline  (VIBRA -TABS) 100 MG tablet, Take 1 tablet (100 mg total) by mouth 2 (two) times daily for 10 days., Disp: 20 tablet, Rfl: 0   predniSONE  (DELTASONE ) 20 MG tablet, Take two tablets once daily for five days, Disp: 10 tablet, Rfl: 0   albuterol  (VENTOLIN  HFA) 108 (90 Base) MCG/ACT inhaler, TAKE 2 PUFFS BY MOUTH EVERY 4-6 HOURS AS NEEDED FOR WHEEZE OR SHORTNESS OF BREATH, Disp: 18 g, Rfl: 1   cetirizine (ZYRTEC) 10 MG tablet, Take two tablets nightly, Disp: , Rfl:    EPINEPHrine  0.3 mg/0.3 mL IJ SOAJ injection, Inject 0.3 mg into  the muscle as needed for anaphylaxis., Disp: 1 each, Rfl: 0   fluticasone  (FLOVENT  HFA) 110 MCG/ACT inhaler, Inhale 2 puffs twice a day with spacer to help prevent cough and wheeze., Disp: 1 each, Rfl: 3   ibuprofen (ADVIL) 200 MG tablet, Take 400 mg by mouth every 6 (six) hours as needed., Disp: , Rfl:    Inulin (FIBER CHOICE PO), Take 5 capsules by mouth at bedtime., Disp: , Rfl:    levonorgestrel  (MIRENA ) 20 MCG/DAY IUD, 1 each by Intrauterine route once., Disp: , Rfl:    levothyroxine  (SYNTHROID ) 150 MCG tablet, Take 1 tablet (150 mcg total) by mouth daily before breakfast., Disp: 90 tablet, Rfl: 3   lovastatin  (MEVACOR ) 20 MG tablet, Take 1 tablet (20 mg total) by mouth at bedtime., Disp: 90 tablet, Rfl:  3   Multiple Vitamin (MULTIVITAMIN) tablet, Take 1 tablet by mouth daily., Disp: , Rfl:    Norethindrone-Ethinyl Estradiol-Fe Biphas (LO LOESTRIN FE ) 1 MG-10 MCG / 10 MCG tablet, TAKE 1 TABLET BY MOUTH DAILY, Disp: 84 tablet, Rfl: 1   nortriptyline  (PAMELOR ) 10 MG capsule, TAKE ONE CAPSULE BY MOUTH AT BEDTIME, Disp: 90 capsule, Rfl: 0   tiZANidine  (ZANAFLEX ) 4 MG tablet, TAKE ONE-HALF TO ONE TABLET BY MOUTH EVERY NIGHT AS NEEDED FOR MUSCLE SPASM, Disp: 30 tablet, Rfl: 0   Vitamin D , Cholecalciferol, 25 MCG (1000 UT) CAPS, Take 1 capsule by mouth daily., Disp: , Rfl:   "

## 2025-01-11 ENCOUNTER — Encounter: Payer: Self-pay | Admitting: Family

## 2025-01-11 DIAGNOSIS — F458 Other somatoform disorders: Secondary | ICD-10-CM

## 2025-01-11 MED ORDER — TIZANIDINE HCL 4 MG PO TABS: ORAL_TABLET | ORAL | 0 refills | Status: AC
